# Patient Record
Sex: Male | Born: 1950
Health system: Southern US, Community
[De-identification: ages and names within clinical notes are randomized; demographics above are authoritative.]

## PROBLEM LIST (undated history)

## (undated) DIAGNOSIS — N200 Calculus of kidney: Secondary | ICD-10-CM

## (undated) DIAGNOSIS — M1990 Unspecified osteoarthritis, unspecified site: Secondary | ICD-10-CM

## (undated) DIAGNOSIS — Z87442 Personal history of urinary calculi: Secondary | ICD-10-CM

## (undated) DIAGNOSIS — D696 Thrombocytopenia, unspecified: Secondary | ICD-10-CM

## (undated) DIAGNOSIS — R011 Cardiac murmur, unspecified: Secondary | ICD-10-CM

## (undated) DIAGNOSIS — B1920 Unspecified viral hepatitis C without hepatic coma: Secondary | ICD-10-CM

## (undated) DIAGNOSIS — A630 Anogenital (venereal) warts: Secondary | ICD-10-CM

## (undated) DIAGNOSIS — K5790 Diverticulosis of intestine, part unspecified, without perforation or abscess without bleeding: Secondary | ICD-10-CM

## (undated) HISTORY — DX: Anogenital (venereal) warts: A63.0

## (undated) HISTORY — DX: Calculus of kidney: N20.0

## (undated) HISTORY — DX: Diverticulosis of intestine, part unspecified, without perforation or abscess without bleeding: K57.90

## (undated) HISTORY — PX: JOINT REPLACEMENT: SHX530

## (undated) HISTORY — DX: Thrombocytopenia, unspecified: D69.6

## (undated) HISTORY — DX: Unspecified viral hepatitis C without hepatic coma: B19.20

## (undated) HISTORY — DX: Unspecified osteoarthritis, unspecified site: M19.90

## (undated) HISTORY — PX: WISDOM TOOTH EXTRACTION: SHX21

---

## 2000-08-09 ENCOUNTER — Encounter: Payer: Self-pay | Admitting: Emergency Medicine

## 2000-08-09 ENCOUNTER — Emergency Department (HOSPITAL_COMMUNITY): Admission: EM | Admit: 2000-08-09 | Discharge: 2000-08-09 | Payer: Self-pay | Admitting: Emergency Medicine

## 2000-10-06 DIAGNOSIS — N200 Calculus of kidney: Secondary | ICD-10-CM

## 2000-10-06 HISTORY — DX: Calculus of kidney: N20.0

## 2002-10-06 HISTORY — PX: COLONOSCOPY: SHX174

## 2004-10-13 ENCOUNTER — Emergency Department (HOSPITAL_COMMUNITY): Admission: EM | Admit: 2004-10-13 | Discharge: 2004-10-13 | Payer: Self-pay | Admitting: Family Medicine

## 2004-11-06 ENCOUNTER — Ambulatory Visit: Payer: Self-pay | Admitting: Internal Medicine

## 2004-11-27 ENCOUNTER — Emergency Department (HOSPITAL_COMMUNITY): Admission: EM | Admit: 2004-11-27 | Discharge: 2004-11-27 | Payer: Self-pay | Admitting: Family Medicine

## 2004-11-28 ENCOUNTER — Ambulatory Visit (HOSPITAL_COMMUNITY): Admission: RE | Admit: 2004-11-28 | Discharge: 2004-11-28 | Payer: Self-pay | Admitting: Family Medicine

## 2004-12-04 ENCOUNTER — Emergency Department (HOSPITAL_COMMUNITY): Admission: EM | Admit: 2004-12-04 | Discharge: 2004-12-04 | Payer: Self-pay | Admitting: Family Medicine

## 2004-12-17 ENCOUNTER — Ambulatory Visit: Payer: Self-pay | Admitting: Internal Medicine

## 2005-01-03 ENCOUNTER — Ambulatory Visit: Payer: Self-pay | Admitting: Gastroenterology

## 2005-01-15 ENCOUNTER — Ambulatory Visit: Payer: Self-pay | Admitting: Gastroenterology

## 2005-12-22 ENCOUNTER — Ambulatory Visit: Payer: Self-pay | Admitting: Internal Medicine

## 2005-12-24 ENCOUNTER — Ambulatory Visit: Payer: Self-pay | Admitting: Internal Medicine

## 2007-06-16 ENCOUNTER — Ambulatory Visit: Payer: Self-pay | Admitting: Internal Medicine

## 2007-06-16 DIAGNOSIS — B182 Chronic viral hepatitis C: Secondary | ICD-10-CM | POA: Insufficient documentation

## 2007-06-16 DIAGNOSIS — Z8619 Personal history of other infectious and parasitic diseases: Secondary | ICD-10-CM | POA: Insufficient documentation

## 2007-08-27 ENCOUNTER — Telehealth: Payer: Self-pay | Admitting: Internal Medicine

## 2007-09-14 ENCOUNTER — Telehealth (INDEPENDENT_AMBULATORY_CARE_PROVIDER_SITE_OTHER): Payer: Self-pay | Admitting: *Deleted

## 2007-09-17 ENCOUNTER — Ambulatory Visit: Payer: Self-pay | Admitting: Internal Medicine

## 2007-09-18 ENCOUNTER — Encounter (INDEPENDENT_AMBULATORY_CARE_PROVIDER_SITE_OTHER): Payer: Self-pay | Admitting: *Deleted

## 2007-09-18 LAB — CONVERTED CEMR LAB
INR: 1.1 (ref 0.0–1.5)
Prothrombin Time: 14.2 s (ref 11.6–15.2)

## 2007-11-18 ENCOUNTER — Ambulatory Visit: Payer: Self-pay | Admitting: Gastroenterology

## 2007-12-24 ENCOUNTER — Telehealth (INDEPENDENT_AMBULATORY_CARE_PROVIDER_SITE_OTHER): Payer: Self-pay | Admitting: *Deleted

## 2008-01-07 ENCOUNTER — Telehealth (INDEPENDENT_AMBULATORY_CARE_PROVIDER_SITE_OTHER): Payer: Self-pay | Admitting: *Deleted

## 2008-01-13 ENCOUNTER — Ambulatory Visit: Payer: Self-pay | Admitting: Internal Medicine

## 2008-02-27 ENCOUNTER — Encounter: Payer: Self-pay | Admitting: Internal Medicine

## 2008-04-27 ENCOUNTER — Ambulatory Visit: Payer: Self-pay | Admitting: Gastroenterology

## 2008-10-06 DIAGNOSIS — D696 Thrombocytopenia, unspecified: Secondary | ICD-10-CM

## 2008-10-06 HISTORY — DX: Thrombocytopenia, unspecified: D69.6

## 2008-11-08 ENCOUNTER — Ambulatory Visit: Payer: Self-pay | Admitting: Internal Medicine

## 2008-12-11 ENCOUNTER — Telehealth (INDEPENDENT_AMBULATORY_CARE_PROVIDER_SITE_OTHER): Payer: Self-pay | Admitting: *Deleted

## 2009-03-07 ENCOUNTER — Ambulatory Visit: Payer: Self-pay | Admitting: Internal Medicine

## 2009-03-07 DIAGNOSIS — Z87442 Personal history of urinary calculi: Secondary | ICD-10-CM | POA: Insufficient documentation

## 2009-03-07 DIAGNOSIS — K5730 Diverticulosis of large intestine without perforation or abscess without bleeding: Secondary | ICD-10-CM | POA: Insufficient documentation

## 2009-03-14 ENCOUNTER — Encounter (INDEPENDENT_AMBULATORY_CARE_PROVIDER_SITE_OTHER): Payer: Self-pay | Admitting: *Deleted

## 2010-04-10 ENCOUNTER — Ambulatory Visit: Payer: Self-pay | Admitting: Internal Medicine

## 2010-04-10 DIAGNOSIS — IMO0002 Reserved for concepts with insufficient information to code with codable children: Secondary | ICD-10-CM | POA: Insufficient documentation

## 2010-04-12 ENCOUNTER — Ambulatory Visit: Payer: Self-pay | Admitting: Internal Medicine

## 2010-04-22 LAB — CONVERTED CEMR LAB
ALT: 58 units/L — ABNORMAL HIGH (ref 0–53)
AST: 43 units/L — ABNORMAL HIGH (ref 0–37)
Albumin: 3.9 g/dL (ref 3.5–5.2)
Alkaline Phosphatase: 64 units/L (ref 39–117)
BUN: 20 mg/dL (ref 6–23)
Basophils Absolute: 0 10*3/uL (ref 0.0–0.1)
Basophils Relative: 0.6 % (ref 0.0–3.0)
Bilirubin Urine: NEGATIVE
Bilirubin, Direct: 0.1 mg/dL (ref 0.0–0.3)
CO2: 30 meq/L (ref 19–32)
Calcium: 8.9 mg/dL (ref 8.4–10.5)
Chloride: 109 meq/L (ref 96–112)
Cholesterol: 129 mg/dL (ref 0–200)
Creatinine, Ser: 0.7 mg/dL (ref 0.4–1.5)
Eosinophils Absolute: 0.2 10*3/uL (ref 0.0–0.7)
Eosinophils Relative: 5 % (ref 0.0–5.0)
GFR calc non Af Amer: 115.9 mL/min (ref >60)
Glucose, Bld: 99 mg/dL (ref 70–99)
HCT: 43.7 % (ref 39.0–52.0)
HDL: 35 mg/dL — ABNORMAL LOW (ref >39.00)
Hemoglobin, Urine: NEGATIVE
Hemoglobin: 15.3 g/dL (ref 13.0–17.0)
Ketones, ur: NEGATIVE mg/dL
LDL Cholesterol: 85 mg/dL (ref 0–99)
Leukocytes, UA: NEGATIVE
Lymphocytes Relative: 24.8 % (ref 12.0–46.0)
Lymphs Abs: 0.9 10*3/uL (ref 0.7–4.0)
MCHC: 35.1 g/dL (ref 30.0–36.0)
MCV: 92.1 fL (ref 78.0–100.0)
Monocytes Absolute: 0.4 10*3/uL (ref 0.1–1.0)
Monocytes Relative: 9.3 % (ref 3.0–12.0)
Neutro Abs: 2.3 10*3/uL (ref 1.4–7.7)
Neutrophils Relative %: 60.3 % (ref 43.0–77.0)
Nitrite: NEGATIVE
PSA: 0.34 ng/mL (ref 0.10–4.00)
Platelets: 114 10*3/uL — ABNORMAL LOW (ref 150.0–400.0)
Potassium: 4.4 meq/L (ref 3.5–5.1)
RBC: 4.75 M/uL (ref 4.22–5.81)
RDW: 13.3 % (ref 11.5–14.6)
Sodium: 144 meq/L (ref 135–145)
Specific Gravity, Urine: 1.02 (ref 1.000–1.030)
TSH: 1.73 microintl units/mL (ref 0.35–5.50)
Total Bilirubin: 0.5 mg/dL (ref 0.3–1.2)
Total CHOL/HDL Ratio: 4
Total Protein, Urine: NEGATIVE mg/dL
Total Protein: 6.6 g/dL (ref 6.0–8.3)
Triglycerides: 46 mg/dL (ref 0.0–149.0)
Urine Glucose: NEGATIVE mg/dL
Urobilinogen, UA: 0.2 (ref 0.0–1.0)
VLDL: 9.2 mg/dL (ref 0.0–40.0)
WBC: 3.8 10*3/uL — ABNORMAL LOW (ref 4.5–10.5)
pH: 7.5 (ref 5.0–8.0)

## 2010-10-06 HISTORY — PX: OTHER SURGICAL HISTORY: SHX169

## 2010-10-06 HISTORY — PX: FLEXIBLE SIGMOIDOSCOPY: SHX1649

## 2010-11-03 LAB — CONVERTED CEMR LAB
ALT: 73 units/L — ABNORMAL HIGH (ref 0–53)
ALT: 93 units/L — ABNORMAL HIGH (ref 0–53)
AST: 51 units/L — ABNORMAL HIGH (ref 0–37)
AST: 63 units/L — ABNORMAL HIGH (ref 0–37)
Albumin: 3.9 g/dL (ref 3.5–5.2)
Albumin: 4 g/dL (ref 3.5–5.2)
Alkaline Phosphatase: 56 units/L (ref 39–117)
Alkaline Phosphatase: 57 units/L (ref 39–117)
BUN: 21 mg/dL (ref 6–23)
BUN: 22 mg/dL (ref 6–23)
Basophils Absolute: 0 10*3/uL (ref 0.0–0.1)
Basophils Absolute: 0 10*3/uL (ref 0.0–0.1)
Basophils Relative: 0.2 % (ref 0.0–1.0)
Basophils Relative: 0.3 % (ref 0.0–3.0)
Bilirubin, Direct: 0.2 mg/dL (ref 0.0–0.3)
Bilirubin, Direct: 0.2 mg/dL (ref 0.0–0.3)
CO2: 32 meq/L (ref 19–32)
CO2: 32 meq/L (ref 19–32)
Calcium: 9 mg/dL (ref 8.4–10.5)
Calcium: 9.3 mg/dL (ref 8.4–10.5)
Chloride: 105 meq/L (ref 96–112)
Chloride: 111 meq/L (ref 96–112)
Cholesterol: 131 mg/dL (ref 0–200)
Cholesterol: 143 mg/dL (ref 0–200)
Creatinine, Ser: 0.8 mg/dL (ref 0.4–1.5)
Creatinine, Ser: 0.9 mg/dL (ref 0.4–1.5)
Eosinophils Absolute: 0.2 10*3/uL (ref 0.0–0.6)
Eosinophils Absolute: 0.2 10*3/uL (ref 0.0–0.7)
Eosinophils Relative: 5.3 % — ABNORMAL HIGH (ref 0.0–5.0)
Eosinophils Relative: 5.6 % — ABNORMAL HIGH (ref 0.0–5.0)
GFR calc Af Amer: 113 mL/min
GFR calc non Af Amer: 106.34 mL/min (ref >60)
GFR calc non Af Amer: 93 mL/min
Glucose, Bld: 102 mg/dL — ABNORMAL HIGH (ref 70–99)
Glucose, Bld: 87 mg/dL (ref 70–99)
HCT: 44.4 % (ref 39.0–52.0)
HCT: 47.1 % (ref 39.0–52.0)
HDL: 35.7 mg/dL — ABNORMAL LOW (ref >39.0)
HDL: 43.2 mg/dL (ref >39.00)
Hemoglobin: 15.7 g/dL (ref 13.0–17.0)
Hemoglobin: 16.5 g/dL (ref 13.0–17.0)
LDL Cholesterol: 88 mg/dL (ref 0–99)
LDL Cholesterol: 90 mg/dL (ref 0–99)
Lymphocytes Relative: 19.7 % (ref 12.0–46.0)
Lymphocytes Relative: 23.7 % (ref 12.0–46.0)
Lymphs Abs: 0.7 10*3/uL (ref 0.7–4.0)
MCHC: 34.9 g/dL (ref 30.0–36.0)
MCHC: 35.3 g/dL (ref 30.0–36.0)
MCV: 88.7 fL (ref 78.0–100.0)
MCV: 91.8 fL (ref 78.0–100.0)
Monocytes Absolute: 0.3 10*3/uL (ref 0.2–0.7)
Monocytes Absolute: 0.4 10*3/uL (ref 0.1–1.0)
Monocytes Relative: 10.5 % (ref 3.0–12.0)
Monocytes Relative: 7.7 % (ref 3.0–11.0)
Neutro Abs: 2.3 10*3/uL (ref 1.4–7.7)
Neutro Abs: 2.5 10*3/uL (ref 1.4–7.7)
Neutrophils Relative %: 63.1 % (ref 43.0–77.0)
Neutrophils Relative %: 63.9 % (ref 43.0–77.0)
PSA: 0.48 ng/mL (ref 0.10–4.00)
PSA: 0.56 ng/mL (ref 0.10–4.00)
Platelets: 144 10*3/uL — ABNORMAL LOW (ref 150–400)
Platelets: 99 10*3/uL — ABNORMAL LOW (ref 150.0–400.0)
Potassium: 4 meq/L (ref 3.5–5.1)
Potassium: 4.2 meq/L (ref 3.5–5.1)
RBC: 5.01 M/uL (ref 4.22–5.81)
RBC: 5.13 M/uL (ref 4.22–5.81)
RDW: 12.3 % (ref 11.5–14.6)
RDW: 12.3 % (ref 11.5–14.6)
Sodium: 141 meq/L (ref 135–145)
Sodium: 145 meq/L (ref 135–145)
TSH: 0.68 microintl units/mL (ref 0.35–5.50)
TSH: 1.21 microintl units/mL (ref 0.35–5.50)
Total Bilirubin: 1.3 mg/dL — ABNORMAL HIGH (ref 0.3–1.2)
Total Bilirubin: 1.3 mg/dL — ABNORMAL HIGH (ref 0.3–1.2)
Total CHOL/HDL Ratio: 3
Total CHOL/HDL Ratio: 3.7
Total Protein: 6.8 g/dL (ref 6.0–8.3)
Total Protein: 6.9 g/dL (ref 6.0–8.3)
Triglycerides: 37 mg/dL (ref 0–149)
Triglycerides: 49 mg/dL (ref 0.0–149.0)
VLDL: 7 mg/dL (ref 0–40)
VLDL: 9.8 mg/dL (ref 0.0–40.0)
WBC: 3.6 10*3/uL — ABNORMAL LOW (ref 4.5–10.5)
WBC: 3.9 10*3/uL — ABNORMAL LOW (ref 4.5–10.5)

## 2010-11-05 NOTE — Assessment & Plan Note (Signed)
Summary: cpx//pt will be fasting//lch   Vital Signs:  Patient profile:   60 year old male Height:      69.5 inches Weight:      169.8 pounds BMI:     24.80 Temp:     98.3 degrees F oral Resp:     14 per minute BP sitting:   118 / 70  (left arm) Cuff size:   large  Vitals Entered By: Shonna Chock (April 10, 2010 8:26 AM)  CC: CPX (not fasting today), General Medical Evaluation, Lower Extremity Joint pain Comments REVIEWED MED LIST, PATIENT AGREED DOSE AND INSTRUCTION CORRECT    CC:  CPX (not fasting today), General Medical Evaluation, and Lower Extremity Joint pain.  History of Present Illness: Timothy Bentley is here for a physical ; he has occasional RLE pain from hip to ankle.   The patient reports some  associated  decreased ROM, but denies swelling, redness, giving away, locking, popping, stiffness for >1 hr, or weakness.  The pain is located in the right hip andradiates to the  right ankle.  The pain began gradually several weeks ago and with no injury.  The pain is described as aching and intermittent. To date  ther has been  no evaluation.  The patient denies the following symptoms: fever, rash, photosensitivity, eye symptoms, diarrhea, and dysuria. Rx: none, but it has been better this week.  Allergies (verified): No Known Drug Allergies  Past History:  Past Medical History: Hepatitis C, Genotype 23 in  2000;[facility]  , treatment declined Early Repolarization ST-T EKG changes Nephrolithiasis, hx of 2002 ,X1 Diverticulosis, colon  Past Surgical History: Denies surgical history except Wisdom Teeth extraction Colonoscopy  2007: Diverticulosis   Family History: Father: Meniere's Syndrome, alcoholism,CAD ,S/P CABG Mother: negative Siblings: negative; M uncle:PMH of  Hepatitis ?A  Social History: Heart Healthy Diet Single Never Smoked Alcohol use-no Regular exercise-yes: tennis, squash, yoga, running ,swimming, weights Occupation: [employer]   Review of  Systems General:  Denies chills, fatigue, fever, loss of appetite, sweats, and weight loss. Eyes:  Denies discharge, eye pain, red eye, and vision loss-both eyes. ENT:  Denies difficulty swallowing, hoarseness, nasal congestion, and sinus pressure. CV:  Denies chest pain or discomfort, leg cramps with exertion, palpitations, and shortness of breath with exertion. Resp:  Denies cough, shortness of breath, sputum productive, and wheezing. GI:  Complains of indigestion; denies abdominal pain, bloody stools, constipation, dark tarry stools, diarrhea, and yellowish skin color; Diet related dyspepsia; no Rx. No clay colored stool. GU:  Denies discharge, hematuria, and incontinence; No dark urine. MS:  Denies low back pain, mid back pain, and thoracic pain. Derm:  Denies changes in nail beds, dryness, hair loss, and lesion(s); Groin " prickly heat". Neuro:  Denies disturbances in coordination, numbness, poor balance, and tingling. Psych:  Denies anxiety and depression. Endo:  Denies cold intolerance, excessive hunger, excessive thirst, excessive urination, and heat intolerance. Heme:  Denies abnormal bruising and bleeding. Allergy:  Denies itching eyes and sneezing.  Physical Exam  General:  Thin but well-nourished; alert,appropriate and cooperative throughout examination Head:  Normocephalic and atraumatic without obvious abnormalities.  Eyes:  No corneal or conjunctival inflammation noted. Perrla. Funduscopic exam benign, without hemorrhages, exudates or papilledema.  Ears:  External ear exam shows no significant lesions or deformities.  Otoscopic examination reveals clear canals, tympanic membranes are intact bilaterally without bulging, retraction, inflammation or discharge. Hearing is grossly normal bilaterally. Nose:  External nasal examination shows  no deformity or inflammation. Nasal mucosa are pink and moist without lesions or exudates. Mouth:  Oral mucosa and oropharynx without lesions or  exudates.  Teeth in good repair. Neck:  No deformities, masses, or tenderness noted. Lungs:  Normal respiratory effort, chest expands symmetrically. Lungs are clear to auscultation, no crackles or wheezes. Heart:  regular rhythm, no murmur, no gallop, no rub, no JVD, no HJR, and bradycardia.   Abdomen:  Bowel sounds positive,abdomen soft and non-tender without masses, organomegaly or hernias noted. Rectal:  No external abnormalities noted. Normal sphincter tone. No rectal masses or tenderness. Genitalia:  Testes bilaterally descended without nodularity, tenderness or masses. No scrotal masses or lesions. No penis lesions or urethral discharge. Small L epididymal granuloma  Prostate:  Prostate gland firm and smooth, no enlargement, nodularity, tenderness, mass, asymmetry or induration. Msk:  No deformity or scoliosis noted of thoracic or lumbar spine.   Pulses:  R and L carotid,radial,dorsalis pedis and posterior tibial pulses are full and equal bilaterally Extremities:  No clubbing, cyanosis, edema, or deformity noted with normal full range of motion of all joints.Negative SLR   Neurologic:  alert & oriented X3, strength normal in all extremities, gait(heel & toe walking) normal, and DTRs symmetrical and normal.   Skin:  Intact without suspicious lesions or rashes. No jaundice Cervical Nodes:  No lymphadenopathy noted Axillary Nodes:  No palpable lymphadenopathy Psych:  memory intact for recent and remote, normally interactive, and good eye contact.     Impression & Recommendations:  Problem # 1:  ROUTINE GENERAL MEDICAL EXAM@[email or web address]  CARE FACL (ICD-V70.0)  Orders: EKG w/ Interpretation (93000)  Problem # 2:  LUMBAR RADICULOPATHY, RIGHT (ICD-724.4)  Problem # 3:  HEPATITIS C (ICD-070.51) PMH of ; clinically stable  Problem # 4:  NEPHROLITHIASIS, HX OF (ICD-V13.01) PMH of X 1  Patient Instructions: 1)  Please schedule fasting labs: 2)  BMP ; 3)  Hepatic Panel; 4)  Lipid Panel ; 5)   TSH; 6)  CBC w/ Diff ; 7)  Urine-dip; 8)  PSA. Codes: V70.0,070.51,724.4.

## 2010-11-07 ENCOUNTER — Telehealth (INDEPENDENT_AMBULATORY_CARE_PROVIDER_SITE_OTHER): Payer: Self-pay | Admitting: *Deleted

## 2010-11-13 NOTE — Progress Notes (Signed)
Summary: Lab Request  Phone Note Call from Patient   Caller: [facility] Summary of Call: Arline Asp from Dr. Trilby Drummer office called requesting last labs be faxed to her. Labs printed and faxed Initial call taken by: Lavell Islam,  November 07, 2010 9:01 AM

## 2010-12-04 ENCOUNTER — Encounter (HOSPITAL_BASED_OUTPATIENT_CLINIC_OR_DEPARTMENT_OTHER)
Admission: RE | Admit: 2010-12-04 | Discharge: 2010-12-04 | Disposition: A | Discharge status: Home / Self Care | Payer: BC Managed Care – PPO | Source: Ambulatory Visit | Attending: General Surgery | Admitting: General Surgery

## 2010-12-04 DIAGNOSIS — Z01812 Encounter for preprocedural laboratory examination: Secondary | ICD-10-CM | POA: Insufficient documentation

## 2010-12-04 LAB — DIFFERENTIAL
Basophils Relative: 0 % (ref 0–1)
Eosinophils Absolute: 0.2 10*3/uL (ref 0.0–0.7)
Monocytes Absolute: 0.4 10*3/uL (ref 0.1–1.0)
Monocytes Relative: 8 % (ref 3–12)
Neutrophils Relative %: 66 % (ref 43–77)

## 2010-12-04 LAB — COMPREHENSIVE METABOLIC PANEL
ALT: 66 U/L — ABNORMAL HIGH (ref 0–53)
AST: 50 U/L — ABNORMAL HIGH (ref 0–37)
Albumin: 4.1 g/dL (ref 3.5–5.2)
Alkaline Phosphatase: 60 U/L (ref 39–117)
BUN: 18 mg/dL (ref 6–23)
CO2: 30 mEq/L (ref 19–32)
Calcium: 9.2 mg/dL (ref 8.4–10.5)
Chloride: 104 mEq/L (ref 96–112)
Creatinine, Ser: 0.82 mg/dL (ref 0.4–1.5)
GFR calc Af Amer: >60 mL/min (ref >60)
GFR calc non Af Amer: >60 mL/min (ref >60)
Glucose, Bld: 101 mg/dL — ABNORMAL HIGH (ref 70–99)
Potassium: 4.4 mEq/L (ref 3.5–5.1)
Sodium: 139 mEq/L (ref 135–145)
Total Bilirubin: 0.9 mg/dL (ref 0.3–1.2)
Total Protein: 6.8 g/dL (ref 6.0–8.3)

## 2010-12-04 LAB — CBC
MCH: 31.5 pg (ref 26.0–34.0)
MCHC: 36.1 g/dL — ABNORMAL HIGH (ref 30.0–36.0)
Platelets: 122 10*3/uL — ABNORMAL LOW (ref 150–400)
RDW: 12.6 % (ref 11.5–15.5)

## 2010-12-06 ENCOUNTER — Other Ambulatory Visit: Payer: Self-pay | Admitting: General Surgery

## 2010-12-06 ENCOUNTER — Ambulatory Visit (HOSPITAL_BASED_OUTPATIENT_CLINIC_OR_DEPARTMENT_OTHER)
Admission: RE | Admit: 2010-12-06 | Discharge: 2010-12-06 | Disposition: A | Discharge status: Home / Self Care | Payer: BC Managed Care – PPO | Source: Ambulatory Visit | Attending: General Surgery | Admitting: General Surgery

## 2010-12-06 DIAGNOSIS — A630 Anogenital (venereal) warts: Secondary | ICD-10-CM | POA: Insufficient documentation

## 2010-12-06 DIAGNOSIS — Z01818 Encounter for other preprocedural examination: Secondary | ICD-10-CM | POA: Insufficient documentation

## 2010-12-06 DIAGNOSIS — Z01812 Encounter for preprocedural laboratory examination: Secondary | ICD-10-CM | POA: Insufficient documentation

## 2010-12-06 DIAGNOSIS — B1920 Unspecified viral hepatitis C without hepatic coma: Secondary | ICD-10-CM | POA: Insufficient documentation

## 2010-12-14 NOTE — Op Note (Signed)
  NAMEASHAAD, GAERTNER                 ACCOUNT NO.:  [id number]  MEDICAL RECORD NO.:  [id number]          PATIENT TYPE:  LOCATION:                                 FACILITY:  PHYSICIAN:  Adolph Pollack, M.D.    DATE OF BIRTH:  DATE OF PROCEDURE:  12/06/2010 DATE OF DISCHARGE:                              OPERATIVE REPORT   PREOPERATIVE DIAGNOSIS:  Anal condyloma.  POSTOPERATIVE DIAGNOSIS:  Anal condyloma.  PROCEDURE:  Excision and fulguration of anal condyloma.  SURGEON:  Adolph Pollack, MD  ANESTHESIA:  General plus Marcaine for perianal block.  INDICATIONS:  A 60 year old male who has had problems with genital warts in the past and has been treated by his dermatologist.  He is now noted to have multiple perianal warts including some in the anal canal, presents for the above procedure.  We went over the procedure and risks preoperatively.  TECHNIQUE:  He was brought to the operating room, placed supine on theoperating table and given a general anesthetic by way of LMA.  He was then placed in the lithotomy position and the perianal area was sterilely prepped and draped.  I sharply biopsied three areas of condyloma at the 3, 9, and 12 o'clock positions and sent these to pathology.  Subsequently using electrocautery, I fulgurated the remaining condyloma in the perianal area, some on the perianal skin, some at the anal verge side.  There were two superior and lateral, both on right and left, perianal area which were fulgurated as well.  I then wiped away the Bovie residual and continued to fulgurate more of the smaller condyloma present.  Once I felt I had adequate perianal fulguration, I then used an anoscope and inspected the anal canal.  In the posterior right lateral positions, anal condyloma were noted and these were fulgurated as well.  The rest of the anal canal was clear without evidence of mass or condyloma.  Following this, I performed a perianal block with  0.5% Marcaine with epinephrine.  I then applied topical gel to the perianal area followed by bulky dressing.  He tolerated the procedure well without any apparent complications and was taken to recovery room in satisfactory condition.     Adolph Pollack, M.D.     Kari Baars  D:  12/06/2010  T:  12/07/2010  Job:  [id number]  cc:   Anselmo Rod, MD, [facility]  Electronically Signed by Avel Peace M.D. on 12/14/2010 09:32:25 PM

## 2011-04-15 ENCOUNTER — Encounter: Payer: Self-pay | Admitting: Internal Medicine

## 2011-05-05 ENCOUNTER — Encounter: Payer: Self-pay | Admitting: Internal Medicine

## 2011-05-05 ENCOUNTER — Encounter: Payer: BC Managed Care – PPO | Admitting: Internal Medicine

## 2011-05-05 ENCOUNTER — Ambulatory Visit (INDEPENDENT_AMBULATORY_CARE_PROVIDER_SITE_OTHER): Payer: BC Managed Care – PPO | Admitting: Internal Medicine

## 2011-05-05 VITALS — BP 136/90 | HR 62 | Temp 98.5°F | Resp 12 | Ht 69.5 in | Wt 167.4 lb

## 2011-05-05 DIAGNOSIS — Z87442 Personal history of urinary calculi: Secondary | ICD-10-CM

## 2011-05-05 DIAGNOSIS — IMO0002 Reserved for concepts with insufficient information to code with codable children: Secondary | ICD-10-CM

## 2011-05-05 DIAGNOSIS — B1710 Acute hepatitis C without hepatic coma: Secondary | ICD-10-CM

## 2011-05-05 DIAGNOSIS — Z0000 Encounter for general adult medical examination without abnormal findings: Secondary | ICD-10-CM

## 2011-05-05 LAB — HEPATIC FUNCTION PANEL
ALT: 56 U/L — ABNORMAL HIGH (ref 0–53)
AST: 42 U/L — ABNORMAL HIGH (ref 0–37)
Bilirubin, Direct: 0.2 mg/dL (ref 0.0–0.3)
Total Protein: 7.2 g/dL (ref 6.0–8.3)

## 2011-05-05 LAB — CBC WITH DIFFERENTIAL/PLATELET
Basophils Absolute: 0 10*3/uL (ref 0.0–0.1)
Eosinophils Relative: 3.5 % (ref 0.0–5.0)
Monocytes Relative: 7.6 % (ref 3.0–12.0)
Neutrophils Relative %: 64.8 % (ref 43.0–77.0)
Platelets: 127 10*3/uL — ABNORMAL LOW (ref 150.0–400.0)
WBC: 4.9 10*3/uL (ref 4.5–10.5)

## 2011-05-05 LAB — LIPID PANEL
Cholesterol: 133 mg/dL (ref 0–200)
LDL Cholesterol: 80 mg/dL (ref 0–99)
Triglycerides: 59 mg/dL (ref 0.0–149.0)
VLDL: 11.8 mg/dL (ref 0.0–40.0)

## 2011-05-05 LAB — BASIC METABOLIC PANEL
BUN: 22 mg/dL (ref 6–23)
Calcium: 9.3 mg/dL (ref 8.4–10.5)
Creatinine, Ser: 0.9 mg/dL (ref 0.4–1.5)
GFR: 94.54 mL/min (ref >60.00)

## 2011-05-05 NOTE — Progress Notes (Signed)
Subjective:    Patient ID: Timothy Bentley, male    DOB: 06/10/1951, 60 y.o.   MRN: [id number]  HPI  Wilson  is here for a physical; he has no acute issues. He been treated by Dr Emily Filbert , Dr Loreta Ave & Dr Abbey Chatters for venereal warts.       Review of Systems Patient reports no  vision/ hearing changes,anorexia, weight change, fever ,adenopathy, persistant / recurrent hoarseness, swallowing issues, chest pain,palpitations, edema,persistant / recurrent cough, hemoptysis, dyspnea(rest, exertional, paroxysmal nocturnal), gastrointestinal  bleeding (melena, rectal bleeding), abdominal pain, excessive heart burn, GU symptoms( dysuria, hematuria, pyuria, voiding/incontinence  issues) syncope, focal weakness, memory loss,numbness & tingling, depression, anxiety, abnormal bruising/bleeding, or  musculoskeletal symptoms/signs.      Objective:   Physical Exam Gen.: Thin but healthy and well-nourished in appearance. Alert, appropriate and cooperative throughout exam. Head: Normocephalic without obvious abnormalities;  pattern alopecia  Eyes: No corneal or conjunctival inflammation noted. Pupils equal round reactive to light and accommodation. Fundal exam is benign without hemorrhages, exudate, papilledema. Extraocular motion intact. Vision grossly normal. Ears: External  ear exam reveals no significant lesions or deformities. Canals clear .TMs normal. Hearing is grossly normal bilaterally. Nose: External nasal exam reveals no deformity or inflammation. Nasal mucosa are pink and moist. No lesions or exudates noted. Septum  normal  Mouth: Oral mucosa and oropharynx reveal no lesions or exudates. Teeth in good repair. Neck: No deformities, masses, or tenderness noted. Range of motion &. Thyroid normal. Lungs: Normal respiratory effort; chest expands symmetrically. Lungs are clear to auscultation without rales, wheezes, or increased work of breathing. Heart: Slow  rate and regular  rhythm. Normal S1 and S2. No gallop,  click, or rub. No murmur. Abdomen: Bowel sounds normal; abdomen soft and nontender. No masses, organomegaly or hernias noted. Genitalia negative.: Rectal deferred ( S/P Flex Sigmoid). Note: PSA always < 1.00, 0.49 in 2004)                                                                      Musculoskeletal/extremities: No deformity or scoliosis noted of  the thoracic or lumbar spine. No clubbing, cyanosis, edema, or deformity noted. Range of motion  normal .Tone & strength  Normal.Joints:MINIMAL DIP DJD changes. Nail health  good. Vascular: Carotid, radial artery, dorsalis pedis and  posterior tibial pulses are full and equal. No bruits present. Neurologic: Alert and oriented x3. Deep tendon reflexes symmetrical and normal.          Skin: Intact without suspicious lesions or rashes. Lymph: No cervical, axillary, or inguinal lymphadenopathy present. Psych: Mood and affect are normal. Normally interactive                                                                                         Assessment & Plan:  #1 comprehensive physical exam; no acute findings #2 see Problem List with Assessments & Recommendations Plan: see  Orders  LUMBAR RADICULOPATHY, RIGHT inactive  NEPHROLITHIASIS, HX OF X1 in 2002

## 2011-05-05 NOTE — Patient Instructions (Signed)
Preventive Health Care: Eat a low-fat diet with lots of fruits and vegetables, up to 7-9 servings per day. Consume less than 40 grams of sugar per day from foods & drinks with High Fructose Corn Sugar as #1, 2,3 or # 4 on label. Health Care Power of Attorney & Living Will. Complete if not in place ; these place you in charge of your health care decisions.

## 2011-05-05 NOTE — Assessment & Plan Note (Signed)
inactive

## 2011-05-05 NOTE — Assessment & Plan Note (Signed)
X1 in 2002

## 2011-05-08 ENCOUNTER — Ambulatory Visit (INDEPENDENT_AMBULATORY_CARE_PROVIDER_SITE_OTHER): Payer: BC Managed Care – PPO | Admitting: General Surgery

## 2011-05-08 DIAGNOSIS — A630 Anogenital (venereal) warts: Secondary | ICD-10-CM

## 2011-05-08 NOTE — Patient Instructions (Addendum)
Remove bandage and wash area in 4 hours.

## 2011-05-08 NOTE — Progress Notes (Signed)
He is here for followup of his perianal condyloma. He felt one irregular area on the left side. Dermatologist put him on some topical 5-FU. He has some itching.  Physical exam:  Anorectal-left buttock region small condyloma noted. Small anterior fissure noted it is nontender. On digital rectal exam no masses felt.  Anoscopy-no anal canal condyloma noted. Small internal hemorrhoid right anterior side.  Assessment: Perianal condyloma with one area of recurrence. Chemical ablation performed.  Plan: Return visit in 3 weeks.

## 2011-06-03 ENCOUNTER — Ambulatory Visit (INDEPENDENT_AMBULATORY_CARE_PROVIDER_SITE_OTHER): Payer: BC Managed Care – PPO | Admitting: General Surgery

## 2011-06-03 DIAGNOSIS — A630 Anogenital (venereal) warts: Secondary | ICD-10-CM

## 2011-06-03 NOTE — Patient Instructions (Signed)
Continue your self inspections.

## 2011-06-03 NOTE — Progress Notes (Signed)
Timothy Bentley is here for followup after chemical ablation of a anal condyloma. He states he feels the area is gone.  Exam: There are no perianal lesions present.  Assessment: Anal condyloma successfully treated with chemical ablation.  Plan: Continue self exams. Return visit p.r.n.

## 2011-07-31 ENCOUNTER — Ambulatory Visit (INDEPENDENT_AMBULATORY_CARE_PROVIDER_SITE_OTHER): Payer: BC Managed Care – PPO | Admitting: General Surgery

## 2011-07-31 ENCOUNTER — Encounter (INDEPENDENT_AMBULATORY_CARE_PROVIDER_SITE_OTHER): Payer: Self-pay

## 2011-07-31 ENCOUNTER — Encounter (INDEPENDENT_AMBULATORY_CARE_PROVIDER_SITE_OTHER): Payer: Self-pay | Admitting: General Surgery

## 2011-07-31 VITALS — BP 146/82 | HR 80 | Temp 96.8°F | Resp 14 | Ht 69.0 in | Wt 167.0 lb

## 2011-07-31 DIAGNOSIS — A630 Anogenital (venereal) warts: Secondary | ICD-10-CM

## 2011-07-31 NOTE — Patient Instructions (Signed)
Wash areas off in 4 hours.

## 2011-07-31 NOTE — Progress Notes (Signed)
Mr. Rayner returns to the office. He thinks he may have discovered 3 new condyloma. One in the right scrotum. The other 2 are in the perianal area. He does have some itching in these areas.  Exam: GU-small condylomatous lesion in the right scrotal area  Anorectal-2 small condylomas, one on the right and one on the left.  Chemical ablation of the condylomas was performed with Podophyllin and he tolerated this well.  Assessment:  Recurrent perianal condyloma and scrotal condyloma-treated with chemical ablation  Plan:  Wash areas in 4 hours.  Return visit in 4 wks.

## 2011-09-02 ENCOUNTER — Encounter (INDEPENDENT_AMBULATORY_CARE_PROVIDER_SITE_OTHER): Payer: Self-pay | Admitting: General Surgery

## 2011-09-02 ENCOUNTER — Ambulatory Visit (INDEPENDENT_AMBULATORY_CARE_PROVIDER_SITE_OTHER): Payer: BC Managed Care – PPO | Admitting: General Surgery

## 2011-09-02 VITALS — BP 126/84 | HR 72 | Temp 97.9°F | Resp 18 | Ht 69.0 in | Wt 165.0 lb

## 2011-09-02 DIAGNOSIS — A630 Anogenital (venereal) warts: Secondary | ICD-10-CM

## 2011-09-02 NOTE — Progress Notes (Signed)
She is here for followup of his anal condyloma status post chemical ablation treatment. He states he feels that one is still in the perianal area and he's not sure about the scrotal area.  Exam: GU-anal condyloma in the scrotal area is gone.  Anorectal-there is a persistent condyloma on the left side but the other 2 are no longer present.  Podophyllin was applied to the residual left sided condyloma.  Assess:  Anal condyloma and scrotal condyloma-most responded to chemically ablation; repeat chemical ablation was performed on the residual left-sided condyloma  Plan: Return visit 3 weeks.  If this condyloma persists then it may need to be removed surgically.

## 2011-09-02 NOTE — Patient Instructions (Signed)
Wash area in 4 hours. 

## 2011-09-24 ENCOUNTER — Encounter (INDEPENDENT_AMBULATORY_CARE_PROVIDER_SITE_OTHER): Payer: Self-pay | Admitting: General Surgery

## 2011-09-24 ENCOUNTER — Ambulatory Visit (INDEPENDENT_AMBULATORY_CARE_PROVIDER_SITE_OTHER): Payer: BC Managed Care – PPO | Admitting: General Surgery

## 2011-09-24 VITALS — BP 138/94 | HR 60 | Temp 97.0°F | Resp 18 | Ht 69.0 in | Wt 167.2 lb

## 2011-09-24 DIAGNOSIS — Z09 Encounter for follow-up examination after completed treatment for conditions other than malignant neoplasm: Secondary | ICD-10-CM

## 2011-09-24 NOTE — Progress Notes (Signed)
[name] he is here for followup of his chemical treatment of his left-sided condyloma. He thinks it is gone.  Exam: There are no perianal lesions present any longer  Assessment :  Perianal condyloma that have responded to chemical ablation treatment.  Plan: Return visit 3 months

## 2011-09-24 NOTE — Patient Instructions (Signed)
Call if you detect a recurrence before your next visit.

## 2011-12-30 ENCOUNTER — Encounter (INDEPENDENT_AMBULATORY_CARE_PROVIDER_SITE_OTHER): Payer: Self-pay | Admitting: General Surgery

## 2011-12-30 ENCOUNTER — Ambulatory Visit (INDEPENDENT_AMBULATORY_CARE_PROVIDER_SITE_OTHER): Payer: BC Managed Care – PPO | Admitting: General Surgery

## 2011-12-30 VITALS — BP 148/83 | HR 84 | Temp 98.6°F | Ht 69.0 in | Wt 170.6 lb

## 2011-12-30 DIAGNOSIS — A630 Anogenital (venereal) warts: Secondary | ICD-10-CM

## 2011-12-30 NOTE — Progress Notes (Signed)
Mr. Shallenberger returns for followup of his anal condyloma. He thinks he may have a new area on the left side. He also felt a small area on the right side.  Exam: Anal rectal-at the 1:00 position near the base of the scrotum as a new condylomatous type; there is a small skin tag 10:00 position. Podophyllin was applied to both these areas followed by a gauze dressing.  Assessment:  Anal condyloma-definite new area on the left side and questionable area on the right side, both of which have been treated.  Plan: Removed bandage and 4 hours and shower. Followup visit in 3 weeks. If the area does not resolve then we may need to take him to the operating room for biopsy and fulguration.

## 2011-12-30 NOTE — Patient Instructions (Signed)
Removed bandage in 4 hours and clean the area in the shower.

## 2012-01-21 ENCOUNTER — Encounter (INDEPENDENT_AMBULATORY_CARE_PROVIDER_SITE_OTHER): Payer: Self-pay | Admitting: General Surgery

## 2012-01-21 ENCOUNTER — Ambulatory Visit (INDEPENDENT_AMBULATORY_CARE_PROVIDER_SITE_OTHER): Payer: BC Managed Care – PPO | Admitting: General Surgery

## 2012-01-21 VITALS — BP 156/84 | HR 70 | Temp 97.6°F | Resp 18 | Ht 69.0 in | Wt 170.0 lb

## 2012-01-21 DIAGNOSIS — A630 Anogenital (venereal) warts: Secondary | ICD-10-CM

## 2012-01-21 NOTE — Patient Instructions (Signed)
We will schedule your operation today. 

## 2012-01-21 NOTE — Progress Notes (Signed)
Mr. Nunnery is here for follow up exam after his chemical ablation of his right and left-sided anal condyloma.  He says he still feels the areas.  Physical exam: Anal rectal-he still has a condyloma-type lesion on the left side and what appears to be a skin tag with possible early condyloma on the right side. Thus he did not respond to the chemical ablation.  Assessment: Persistent anal condyloma  Plan: Outpatient excision with local anesthesia and smoke evacuator. The procedure and risks were discussed with him. He understands and agrees with the plan.

## 2012-02-05 ENCOUNTER — Other Ambulatory Visit (INDEPENDENT_AMBULATORY_CARE_PROVIDER_SITE_OTHER): Payer: Self-pay | Admitting: General Surgery

## 2012-02-05 DIAGNOSIS — A630 Anogenital (venereal) warts: Secondary | ICD-10-CM

## 2012-02-05 HISTORY — PX: OTHER SURGICAL HISTORY: SHX169

## 2012-02-12 ENCOUNTER — Telehealth (INDEPENDENT_AMBULATORY_CARE_PROVIDER_SITE_OTHER): Payer: Self-pay | Admitting: General Surgery

## 2012-02-12 NOTE — Telephone Encounter (Signed)
Pt calling to report a very itchy rash on his buttocks.  He is S/P anal condyloma surgery.  Dr. Abbey Chatters paged for orders:  Benadryl and hydrocortisone cream topically. Pt updated with this information.

## 2012-02-24 ENCOUNTER — Encounter (INDEPENDENT_AMBULATORY_CARE_PROVIDER_SITE_OTHER): Payer: BC Managed Care – PPO | Admitting: General Surgery

## 2012-03-03 ENCOUNTER — Encounter (INDEPENDENT_AMBULATORY_CARE_PROVIDER_SITE_OTHER): Payer: Self-pay | Admitting: General Surgery

## 2012-03-03 ENCOUNTER — Ambulatory Visit (INDEPENDENT_AMBULATORY_CARE_PROVIDER_SITE_OTHER): Payer: BC Managed Care – PPO | Admitting: General Surgery

## 2012-03-03 VITALS — BP 140/86 | HR 70 | Temp 97.6°F | Resp 16 | Ht 69.0 in | Wt 169.1 lb

## 2012-03-03 DIAGNOSIS — Z9889 Other specified postprocedural states: Secondary | ICD-10-CM

## 2012-03-03 NOTE — Progress Notes (Signed)
Operation:  Excision and fulguration of anal condyloma  Date:  Feb 05, 2012  Pathology: Anal condyloma  HPI: He is here for his first postoperative visit. He has some itching after the surgery but not a lot of pain.   Physical Exam: Perianal area demonstrates 3 healing wounds 2 on the left side and one on the right side.  Assessment:  Wound is healing well after excision and fulguration of anal condyloma.  Plan:  Given his recurrence we'll see him back in one month for reexamination. He will close followup.

## 2012-03-03 NOTE — Patient Instructions (Signed)
Call if you find any new areas before your next appointment.

## 2012-03-17 ENCOUNTER — Other Ambulatory Visit: Payer: Self-pay | Admitting: Dermatology

## 2012-04-01 ENCOUNTER — Encounter (INDEPENDENT_AMBULATORY_CARE_PROVIDER_SITE_OTHER): Payer: BC Managed Care – PPO | Admitting: General Surgery

## 2012-05-05 ENCOUNTER — Encounter: Payer: BC Managed Care – PPO | Admitting: Internal Medicine

## 2012-05-06 ENCOUNTER — Encounter: Payer: Self-pay | Admitting: Internal Medicine

## 2012-05-06 ENCOUNTER — Ambulatory Visit (INDEPENDENT_AMBULATORY_CARE_PROVIDER_SITE_OTHER): Payer: BC Managed Care – PPO | Admitting: Internal Medicine

## 2012-05-06 VITALS — BP 136/80 | HR 70 | Temp 98.6°F | Resp 12 | Ht 69.03 in | Wt 167.0 lb

## 2012-05-06 DIAGNOSIS — Z0000 Encounter for general adult medical examination without abnormal findings: Secondary | ICD-10-CM

## 2012-05-06 LAB — CBC WITH DIFFERENTIAL/PLATELET
Basophils Absolute: 0 10*3/uL (ref 0.0–0.1)
Basophils Relative: 0.4 % (ref 0.0–3.0)
Eosinophils Absolute: 0.2 10*3/uL (ref 0.0–0.7)
Eosinophils Relative: 4 % (ref 0.0–5.0)
HCT: 47.8 % (ref 39.0–52.0)
Hemoglobin: 16.3 g/dL (ref 13.0–17.0)
Lymphocytes Relative: 22.4 % (ref 12.0–46.0)
Lymphs Abs: 1.1 10*3/uL (ref 0.7–4.0)
MCHC: 34.2 g/dL (ref 30.0–36.0)
MCV: 90.2 fl (ref 78.0–100.0)
Monocytes Absolute: 0.4 10*3/uL (ref 0.1–1.0)
Monocytes Relative: 8.7 % (ref 3.0–12.0)
Neutro Abs: 3.1 10*3/uL (ref 1.4–7.7)
Neutrophils Relative %: 64.5 % (ref 43.0–77.0)
Platelets: 132 10*3/uL — ABNORMAL LOW (ref 150.0–400.0)
RBC: 5.3 Mil/uL (ref 4.22–5.81)
RDW: 12.8 % (ref 11.5–14.6)
WBC: 4.7 10*3/uL (ref 4.5–10.5)

## 2012-05-06 LAB — TSH: TSH: 0.71 u[IU]/mL (ref 0.35–5.50)

## 2012-05-06 LAB — LIPID PANEL
HDL: 41.1 mg/dL (ref >39.00)
Triglycerides: 36 mg/dL (ref 0.0–149.0)

## 2012-05-06 LAB — HEPATIC FUNCTION PANEL
Albumin: 4.5 g/dL (ref 3.5–5.2)
Alkaline Phosphatase: 63 U/L (ref 39–117)
Total Protein: 7.2 g/dL (ref 6.0–8.3)

## 2012-05-06 LAB — BASIC METABOLIC PANEL WITH GFR
BUN: 27 mg/dL — ABNORMAL HIGH (ref 6–23)
CO2: 30 meq/L (ref 19–32)
Calcium: 9.4 mg/dL (ref 8.4–10.5)
Chloride: 104 meq/L (ref 96–112)
Creatinine, Ser: 0.8 mg/dL (ref 0.4–1.5)
GFR: 100.79 mL/min
Glucose, Bld: 82 mg/dL (ref 70–99)
Potassium: 4.2 meq/L (ref 3.5–5.1)
Sodium: 140 meq/L (ref 135–145)

## 2012-05-06 NOTE — Patient Instructions (Addendum)
Preventive Health Care: Eat a low-fat diet with lots of fruits and vegetables, up to 7-9 servings per day.  Consume less than 40 grams of sugar per day from foods & drinks with High Fructose Corn Sugar as # 1,2,3 or # 4 on label. Health Care Power of Attorney & Living Will. Complete if not in place ; these place you in charge of your health care decisions. Please try to go on My Chart within the next 24 hours to allow me to release the results directly to you.

## 2012-05-06 NOTE — Progress Notes (Signed)
  Subjective:    Patient ID: Timothy Bentley, male    DOB: November 06, 1950, 61 y.o.   MRN: [id number]  HPI  Timothy Bentley  is here for a physical; he has no acute issues.     Review of Systems Patient reports no  vision/ hearing changes,anorexia, weight change, fever ,adenopathy, persistant / recurrent hoarseness, swallowing issues, chest pain,palpitations, edema,persistant / recurrent cough, hemoptysis, dyspnea(rest, exertional, paroxysmal nocturnal), gastrointestinal  bleeding (melena, rectal bleeding), abdominal pain, excessive heart burn, GU symptoms( dysuria, hematuria, pyuria, voiding/incontinence  issues) syncope, focal weakness, memory loss,numbness & tingling, skin/hair/nail changes,depression, anxiety, or abnormal bruising/bleeding. He has chronic R hip pain , worse with certain tennis moves.     Objective:   Physical Exam Gen.:Thin but healthy and well-nourished in appearance. Alert, appropriate and cooperative throughout exam. Head: Normocephalic without obvious abnormalities  Eyes: No corneal or conjunctival inflammation noted. Pupils equal round reactive to light and accommodation. Fundal exam is benign without hemorrhages, exudate, papilledema. Extraocular motion intact. OD vision grossly decreased. Ears: External  ear exam reveals no significant lesions or deformities. Canals clear .TMs normal. Hearing is grossly normal bilaterally. Nose: External nasal exam reveals no deformity or inflammation. Nasal mucosa are pink and moist. No lesions or exudates noted.  Mouth: Oral mucosa and oropharynx reveal no lesions or exudates. Teeth in good repair. Neck: No deformities, masses, or tenderness noted. Range of motion decreased. Thyroid normal. Lungs: Normal respiratory effort; chest expands symmetrically. Lungs are clear to auscultation without rales, wheezes, or increased work of breathing. Heart: Slow rate and regular  rhythm. Accentuated  S1 ; normal  S2. No gallop, click, or rub. S4 w/o  murmur. Abdomen: Bowel sounds normal; abdomen soft and nontender. No masses, organomegaly or hernias noted. Genitalia: Dr Timothy Bentley                                                                             Musculoskeletal/extremities: No deformity or scoliosis noted of  the thoracic or lumbar spine; but there is some asymmetry of the posterior thoracic musculature suggesting occult scoliosis. . No clubbing, cyanosis, edema, or deformity noted. Range of motion  normal .Tone & strength  normal.Joints normal. Nail health  good. Vascular: Carotid, radial artery, dorsalis pedis and  posterior tibial pulses are full and equal. No bruits present. Neurologic: Alert and oriented x3. Deep tendon reflexes symmetrical and normal.          Skin: Intact without suspicious lesions or rashes. Tanned Lymph: No cervical, axillary, or inguinal lymphadenopathy present. Psych: Mood and affect are normal. Normally interactive                                                                                         Assessment & Plan:  #1 comprehensive physical exam; no acute findings #2 see Problem List with Assessments & Recommendations Plan: see Orders

## 2012-11-20 ENCOUNTER — Other Ambulatory Visit: Payer: Self-pay

## 2013-03-28 ENCOUNTER — Other Ambulatory Visit: Payer: Self-pay | Admitting: Chiropractic Medicine

## 2013-03-28 ENCOUNTER — Ambulatory Visit
Admission: RE | Admit: 2013-03-28 | Discharge: 2013-03-28 | Disposition: A | Discharge status: Home / Self Care | Payer: BC Managed Care – PPO | Source: Ambulatory Visit | Attending: Chiropractic Medicine | Admitting: Chiropractic Medicine

## 2013-03-28 DIAGNOSIS — M25551 Pain in right hip: Secondary | ICD-10-CM

## 2013-05-11 ENCOUNTER — Ambulatory Visit (INDEPENDENT_AMBULATORY_CARE_PROVIDER_SITE_OTHER): Payer: BC Managed Care – PPO | Admitting: Internal Medicine

## 2013-05-11 ENCOUNTER — Encounter: Payer: Self-pay | Admitting: Internal Medicine

## 2013-05-11 VITALS — BP 118/70 | HR 66 | Temp 98.1°F | Resp 12 | Ht 69.5 in | Wt 161.0 lb

## 2013-05-11 DIAGNOSIS — D696 Thrombocytopenia, unspecified: Secondary | ICD-10-CM | POA: Insufficient documentation

## 2013-05-11 DIAGNOSIS — Z0000 Encounter for general adult medical examination without abnormal findings: Secondary | ICD-10-CM

## 2013-05-11 DIAGNOSIS — Z23 Encounter for immunization: Secondary | ICD-10-CM

## 2013-05-11 DIAGNOSIS — Z1331 Encounter for screening for depression: Secondary | ICD-10-CM

## 2013-05-11 LAB — CBC WITH DIFFERENTIAL/PLATELET
Basophils Relative: 0.3 % (ref 0.0–3.0)
Eosinophils Relative: 4.3 % (ref 0.0–5.0)
HCT: 48 % (ref 39.0–52.0)
Lymphs Abs: 1 10*3/uL (ref 0.7–4.0)
MCV: 90.9 fl (ref 78.0–100.0)
Monocytes Absolute: 0.4 10*3/uL (ref 0.1–1.0)
Neutro Abs: 2.3 10*3/uL (ref 1.4–7.7)
Platelets: 119 10*3/uL — ABNORMAL LOW (ref 150.0–400.0)
WBC: 3.8 10*3/uL — ABNORMAL LOW (ref 4.5–10.5)

## 2013-05-11 LAB — BASIC METABOLIC PANEL
CO2: 30 mEq/L (ref 19–32)
Chloride: 104 mEq/L (ref 96–112)
Creatinine, Ser: 0.9 mg/dL (ref 0.4–1.5)

## 2013-05-11 LAB — HEPATIC FUNCTION PANEL
AST: 50 U/L — ABNORMAL HIGH (ref 0–37)
Alkaline Phosphatase: 60 U/L (ref 39–117)
Bilirubin, Direct: 0.2 mg/dL (ref 0.0–0.3)
Total Bilirubin: 1.1 mg/dL (ref 0.3–1.2)

## 2013-05-11 LAB — TSH: TSH: 1.17 u[IU]/mL (ref 0.35–5.50)

## 2013-05-11 LAB — LIPID PANEL
LDL Cholesterol: 86 mg/dL (ref 0–99)
VLDL: 8.4 mg/dL (ref 0.0–40.0)

## 2013-05-11 NOTE — Patient Instructions (Addendum)
Consider glucosamine sulfate 1500 mg daily for joint symptoms. Take this daily  for 3 months and then leave it off for 2 months. This will rehydrate the soft tissues & cartilages.

## 2013-05-11 NOTE — Progress Notes (Signed)
  Subjective:    Patient ID: Timothy Bentley, male    DOB: [birth year], 62 y.o.   MRN: [id number]  HPI  [name] is here for a physical;acute issues include R hip DJD for which he is seeing a Land. He also has some myalgias in legs.     Review of Systems     Objective:   Physical Exam Gen.:Thin but healthy and well-nourished in appearance. Alert, appropriate and cooperative throughout exam. Head: Normocephalic without obvious abnormalities;pattern alopecia  Eyes: No corneal or conjunctival inflammation noted. Pupils unequal OS > OD (past hx trauma). Extraocular motion intact. Vision grossly decreased OD Ears: External  ear exam reveals no significant lesions or deformities. Canals clear .TMs normal. Hearing is grossly normal bilaterally. Nose: External nasal exam reveals no deformity or inflammation. Nasal mucosa are pink and moist. No lesions or exudates noted.   Mouth: Oral mucosa and oropharynx reveal no lesions or exudates. Teeth in good repair. Slight crepitus TMJ Neck: No deformities, masses, or tenderness noted. Range of motion decreased. Thyroid normal. Lungs: Normal respiratory effort; chest expands symmetrically. Lungs are clear to auscultation without rales, wheezes, or increased work of breathing. Heart: Normal rate and rhythm. Normal S1 and S2. No gallop, click, or rub. S4 w/o murmur. Abdomen: Bowel sounds normal; abdomen soft and nontender. No masses, organomegaly or hernias noted. Genitalia: Genitalia normal except for bilateral small epididymal granulomas left varices. Prostate is minimally  Asymmetric without enlargement, nodularity, or induration.                               Musculoskeletal/extremities: No deformity or scoliosis noted of  the thoracic or lumbar spine.  No clubbing, cyanosis, edema, or significant extremity  deformity noted. Range of motion normal but pain @ R hip .Tone & strength  Normal. Joints  reveal mild  DJD DIP changes. Nail health good. Able to  lie down & sit up w/o help. Negative SLR bilaterally Vascular: Carotid, radial artery, dorsalis pedis and  posterior tibial pulses are full and equal. No bruits present. Neurologic: Alert and oriented x3. Deep tendon reflexes symmetrical and normal.         Skin: Intact without suspicious lesions or rashes. Tanned Lymph: No cervical, axillary, or inguinal lymphadenopathy present. Psych: Mood and affect are normal. Normally interactive                                                                                       Assessment & Plan:  #1 comprehensive physical exam; no acute findings #2 OA/DJD #3 myalgias  Plan: see Orders  & Recommendations

## 2013-08-11 ENCOUNTER — Other Ambulatory Visit: Payer: Self-pay

## 2013-09-13 ENCOUNTER — Encounter: Payer: Self-pay | Admitting: Family Medicine

## 2013-09-13 ENCOUNTER — Telehealth: Payer: Self-pay | Admitting: *Deleted

## 2013-09-13 ENCOUNTER — Ambulatory Visit (INDEPENDENT_AMBULATORY_CARE_PROVIDER_SITE_OTHER): Payer: BC Managed Care – PPO | Admitting: Family Medicine

## 2013-09-13 VITALS — BP 140/82 | HR 103 | Temp 102.8°F | Wt 166.6 lb

## 2013-09-13 DIAGNOSIS — H6690 Otitis media, unspecified, unspecified ear: Secondary | ICD-10-CM

## 2013-09-13 DIAGNOSIS — R05 Cough: Secondary | ICD-10-CM

## 2013-09-13 DIAGNOSIS — H6691 Otitis media, unspecified, right ear: Secondary | ICD-10-CM

## 2013-09-13 DIAGNOSIS — R059 Cough, unspecified: Secondary | ICD-10-CM

## 2013-09-13 DIAGNOSIS — J111 Influenza due to unidentified influenza virus with other respiratory manifestations: Secondary | ICD-10-CM

## 2013-09-13 MED ORDER — AMOXICILLIN-POT CLAVULANATE 875-125 MG PO TABS: 1.0000 | ORAL_TABLET | Freq: Two times a day (BID) | ORAL | Status: DC

## 2013-09-13 MED ORDER — OSELTAMIVIR PHOSPHATE 75 MG PO CAPS: 75.0000 mg | ORAL_CAPSULE | Freq: Two times a day (BID) | ORAL | Status: DC

## 2013-09-13 MED ORDER — GUAIFENESIN-CODEINE 100-10 MG/5ML PO SYRP: ORAL_SOLUTION | ORAL | Status: DC

## 2013-09-13 NOTE — Progress Notes (Signed)
   Subjective:    Patient ID: Timothy Bentley, male    DOB: 28-Sep-1951, 62 y.o.   MRN: [id number]  HPI    Review of Systems     Objective:   Physical Exam        Assessment & Plan:   Subjective:     Timothy Bentley is a 62 y.o. male who presents for evaluation of symptoms of a URI. Symptoms include bilateral ear pressure/pain, achiness, congestion, cough described as productive, fever 102 and sinus pressure. Onset of symptoms was 1 week ago, and has been gradually worsening since that time. Treatment to date: none.  The following portions of the patient's history were reviewed and updated as appropriate: allergies, current medications, past family history, past medical history, past social history, past surgical history and problem list.  Review of Systems Pertinent items are noted in HPI.   Objective:    BP 140/82  Pulse 103  Temp(Src) 102.8 F (39.3 C) (Oral)  Wt 166 lb 9.6 oz (75.569 kg)  SpO2 97% General appearance: alert, cooperative, appears stated age and no distress Ears: abnormal TM left ear - erythematous and bulging Nose: Nares normal. Septum midline. Mucosa normal. No drainage or sinus tenderness., mild congestion, sinus tenderness bilateral Throat: lips, mucosa, and tongue normal; teeth and gums normal Neck: no adenopathy, supple, symmetrical, trachea midline and thyroid not enlarged, symmetric, no tenderness/mass/nodules Lungs: diminished breath sounds bilaterally and wheezes bilaterally Heart: S1, S2 normal   Assessment:    bronchitis   Plan:    Suggested symptomatic OTC remedies. Nasal saline spray for congestion. Augmentin per orders. Nasal steroids per orders. Follow up as needed.

## 2013-09-13 NOTE — Patient Instructions (Signed)

## 2013-09-13 NOTE — Telephone Encounter (Signed)
Received message on triage line from patient stating he is having flu-like symptoms. I returned call and left a message for patient to please call back and we could make an appt today with Dr. Laury Axon.

## 2013-09-13 NOTE — Telephone Encounter (Signed)
Patient returned phone call and an appt with Dr. Laury Axon at 3 pm was scheduled for today.

## 2013-09-19 ENCOUNTER — Encounter: Payer: Self-pay | Admitting: Internal Medicine

## 2013-09-19 ENCOUNTER — Ambulatory Visit (INDEPENDENT_AMBULATORY_CARE_PROVIDER_SITE_OTHER): Payer: BC Managed Care – PPO | Admitting: Internal Medicine

## 2013-09-19 ENCOUNTER — Ambulatory Visit (INDEPENDENT_AMBULATORY_CARE_PROVIDER_SITE_OTHER)
Admission: RE | Admit: 2013-09-19 | Discharge: 2013-09-19 | Disposition: A | Discharge status: Home / Self Care | Payer: BC Managed Care – PPO | Source: Ambulatory Visit | Attending: Internal Medicine | Admitting: Internal Medicine

## 2013-09-19 VITALS — BP 122/76 | HR 86 | Temp 98.4°F | Ht 69.5 in | Wt 159.6 lb

## 2013-09-19 DIAGNOSIS — J209 Acute bronchitis, unspecified: Secondary | ICD-10-CM

## 2013-09-19 DIAGNOSIS — B1710 Acute hepatitis C without hepatic coma: Secondary | ICD-10-CM

## 2013-09-19 LAB — CBC WITH DIFFERENTIAL/PLATELET
Eosinophils Relative: 1.1 % (ref 0.0–5.0)
MCV: 88.8 fl (ref 78.0–100.0)
Monocytes Absolute: 1.1 10*3/uL — ABNORMAL HIGH (ref 0.1–1.0)
Monocytes Relative: 8.1 % (ref 3.0–12.0)
Neutrophils Relative %: 83.2 % — ABNORMAL HIGH (ref 43.0–77.0)
Platelets: 278 10*3/uL (ref 150.0–400.0)
WBC: 13.8 10*3/uL — ABNORMAL HIGH (ref 4.5–10.5)

## 2013-09-19 LAB — HEPATIC FUNCTION PANEL
ALT: 24 U/L (ref 0–53)
Bilirubin, Direct: 0.2 mg/dL (ref 0.0–0.3)
Total Bilirubin: 0.7 mg/dL (ref 0.3–1.2)

## 2013-09-19 MED ORDER — BUDESONIDE-FORMOTEROL FUMARATE 160-4.5 MCG/ACT IN AERO: 2.0000 | INHALATION_SPRAY | Freq: Two times a day (BID) | RESPIRATORY_TRACT | Status: DC

## 2013-09-19 MED ORDER — PREDNISONE 20 MG PO TABS: 20.0000 mg | ORAL_TABLET | Freq: Two times a day (BID) | ORAL | Status: DC

## 2013-09-19 NOTE — Patient Instructions (Addendum)
Order for x-rays entered into  the computer; these will be performed at [address] [facility]. across from [facility]. No appointment is necessary. Your next office appointment will be determined based upon review of your pending labs & x-rays. Those instructions will be transmitted to you through My Chart.  Please report any significant change in your symptoms. Symbicort  two inhalations every 12 hours; gargle and spit after use

## 2013-09-19 NOTE — Progress Notes (Signed)
Pre visit review using our clinic review tool, if applicable. No additional management support is needed unless otherwise documented below in the visit note. 

## 2013-09-19 NOTE — Progress Notes (Signed)
   Subjective:    Patient ID: Timothy Bentley, male    DOB: 1951-01-21, 62 y.o.   MRN: [id number]  HPI He returns after completing the broad-spectrum antibiotic for right otitis media. He was given Tamiflu because of the inconclusive flu test. He was also given a narcotic cough syrup for his cough.  The cough persist as a dry, hacking, continuous cough associated with lacerating discomfort.  See symptoms had begun 12/2 with temperatures up to 103. He did not take the flu shot.  He has no history of asthma and has never smoked.    Review of Systems   At this time he denies fever, chills, or sweats.  He has no frontal headache, facial pain, nasal purulence, otic pain, or otic discharge.  The cough is not associated with wheezing or shortness of breath.  He is VERY concerned about med effects on his Hepatitis C     Objective:   Physical Exam General appearance: very fatigued but in good health ;well nourished; no acute distress or increased work of breathing is present.  No  lymphadenopathy about the head, neck, or axilla noted.   Eyes: No conjunctival inflammation or lid edema is present. There is no scleral icterus.  Ears:  External ear exam shows no significant lesions or deformities.  Otoscopic examination reveals clear canals, tympanic membranes are intact bilaterally without bulging, retraction, or discharge. Mild hyperemia of the superior left tympanic membrane which suggest barotrauma from coughing rather than otitis.  Nose:  External nasal examination shows no deformity or inflammation. Nasal mucosa are pink and moist without lesions or exudates. Minimal R  septal dislocation .No obstruction to airflow.   Oral exam: Dental hygiene is good; lips and gums are healthy appearing.There is no oropharyngeal erythema or exudate noted. Slightly hoarse.  Neck:  No deformities,  masses, or tenderness noted.   Supple with full range of motion without pain.   Heart:  Normal rate and regular  rhythm. S1 and S2 normal without gallop, murmur, click, rub or other extra sounds.   Lungs: Diffuse low-grade rales and rhonchi greatest at the left lower base and right anterior chest.  Extremities:  No cyanosis, edema, or clubbing  noted    Skin: Warm & dry w/o jaundice or tenting.         Assessment & Plan:  #1 protracted bronchitis with breath sounds as noted above #2 Hepatitis C; med risk discussed  Plan: See orders

## 2013-09-21 ENCOUNTER — Ambulatory Visit (INDEPENDENT_AMBULATORY_CARE_PROVIDER_SITE_OTHER): Payer: BC Managed Care – PPO | Admitting: Internal Medicine

## 2013-09-21 ENCOUNTER — Encounter: Payer: Self-pay | Admitting: Internal Medicine

## 2013-09-21 VITALS — BP 113/72 | HR 89 | Temp 98.3°F | Wt 158.6 lb

## 2013-09-21 DIAGNOSIS — H6980 Other specified disorders of Eustachian tube, unspecified ear: Secondary | ICD-10-CM

## 2013-09-21 DIAGNOSIS — H6983 Other specified disorders of Eustachian tube, bilateral: Secondary | ICD-10-CM

## 2013-09-21 DIAGNOSIS — J209 Acute bronchitis, unspecified: Secondary | ICD-10-CM

## 2013-09-21 MED ORDER — GUAIFENESIN-CODEINE 100-10 MG/5ML PO SYRP: 5.0000 mL | ORAL_SOLUTION | Freq: Three times a day (TID) | ORAL | Status: DC | PRN

## 2013-09-21 NOTE — Progress Notes (Signed)
Pre visit review using our clinic review tool, if applicable. No additional management support is needed unless otherwise documented below in the visit note. 

## 2013-09-21 NOTE — Progress Notes (Signed)
   Subjective:    Patient ID: Timothy Bentley, male    DOB: December 25, 1950, 62 y.o.   MRN: [id number]  HPI   He has run out of the cough syrup he was using to help sleep. He was taking this while he was taking Tamiflu for apparent flulike illness. He continues to cough but it is now more productive. The mucus is described as white/yellow. He also had paroxysms of dry cough. He can suppress a cough for a while but then it "burst out". He does feel like he is getting better. The bronchodilator/anti-inflammatory inhaler & prednisone were not employed as he has concerns about adverse med effects.  He continues to have pressure in his ears without pain or discharge.He has significant clear rhinitis  His chest x-ray was reviewed; he does have some increased lung markings in the bases but there is no pneumonia present.  The white count revealed significant elevation of the neutrophils with a left shift, suggesting a bacterial process. This is significantly different than his usual low white count lymphocytes in the 20 to-25% range.    Review of Systems   He denies frontal sinus or maxillary sinus area pain or nasal purulence. He's having no dental pain. He also denies fever, chills or sweats     Objective:   Physical Exam General appearance:thin but in good health ;well nourished; no acute distress or increased work of breathing is present.  No  lymphadenopathy about the head, neck, or axilla noted.   Eyes: No conjunctival inflammation or lid edema is present. There is no scleral icterus.  Ears:  External ear exam shows no significant lesions or deformities.  Otoscopic examination reveals clear canals. The left tympanic membrane suggests vascular changes from barotrauma. There is no exudate or evidence of active infection. Nose:  External nasal examination shows no deformity or inflammation. Nasal mucosa are pink and moist without lesions or exudates. No septal dislocation or deviation.No obstruction to  airflow.   Oral exam: Dental hygiene is good; lips and gums are healthy appearing.There is no oropharyngeal erythema or exudate noted.   Neck:  No deformities,  masses, or tenderness noted.   Supple with full range of motion without pain.   Heart:  Normal rate and regular rhythm. S1 and S2 normal without gallop, murmur, click, rub or other extra sounds.   Lungs: Chest is much clearer; he continues to have very low-grade rhonchi. The more dramatic bronchospastic changes heard previously are improved  Extremities:  No cyanosis, edema, or clubbing  noted    Skin: Warm & dry w/o jaundice or tenting.         Assessment & Plan:  #1 bronchitis with scant sputum production, yellowish-white  #2 bronchospasm, improved  #3 eustachian tube dysfunction from edema.  Plan: See orders

## 2013-09-21 NOTE — Patient Instructions (Signed)

## 2013-11-22 ENCOUNTER — Emergency Department (HOSPITAL_COMMUNITY)
Admission: EM | Admit: 2013-11-22 | Discharge: 2013-11-22 | Disposition: A | Discharge status: Home / Self Care | Payer: BC Managed Care – PPO | Attending: Emergency Medicine | Admitting: Emergency Medicine

## 2013-11-22 ENCOUNTER — Encounter (HOSPITAL_COMMUNITY): Payer: Self-pay | Admitting: Emergency Medicine

## 2013-11-22 DIAGNOSIS — W268XXA Contact with other sharp object(s), not elsewhere classified, initial encounter: Secondary | ICD-10-CM | POA: Insufficient documentation

## 2013-11-22 DIAGNOSIS — Y9389 Activity, other specified: Secondary | ICD-10-CM | POA: Insufficient documentation

## 2013-11-22 DIAGNOSIS — S61319A Laceration without foreign body of unspecified finger with damage to nail, initial encounter: Secondary | ICD-10-CM

## 2013-11-22 DIAGNOSIS — Z8619 Personal history of other infectious and parasitic diseases: Secondary | ICD-10-CM | POA: Insufficient documentation

## 2013-11-22 DIAGNOSIS — Y929 Unspecified place or not applicable: Secondary | ICD-10-CM | POA: Insufficient documentation

## 2013-11-22 DIAGNOSIS — Z8719 Personal history of other diseases of the digestive system: Secondary | ICD-10-CM | POA: Insufficient documentation

## 2013-11-22 DIAGNOSIS — Z862 Personal history of diseases of the blood and blood-forming organs and certain disorders involving the immune mechanism: Secondary | ICD-10-CM | POA: Insufficient documentation

## 2013-11-22 DIAGNOSIS — S61209A Unspecified open wound of unspecified finger without damage to nail, initial encounter: Secondary | ICD-10-CM | POA: Insufficient documentation

## 2013-11-22 DIAGNOSIS — Z23 Encounter for immunization: Secondary | ICD-10-CM | POA: Insufficient documentation

## 2013-11-22 DIAGNOSIS — Z87442 Personal history of urinary calculi: Secondary | ICD-10-CM | POA: Insufficient documentation

## 2013-11-22 MED ORDER — TETANUS-DIPHTH-ACELL PERTUSSIS 5-2.5-18.5 LF-MCG/0.5 IM SUSP
0.5000 mL | Freq: Once | INTRAMUSCULAR | Status: AC
  Administered 2013-11-22: 0.5 mL via INTRAMUSCULAR
  Filled 2013-11-22: qty 0.5

## 2013-11-22 NOTE — ED Notes (Signed)
Pt a+ox4, pt reports cutting vegetables tonight and cut tip of L 2nd digit.  Pt with approx 1/4" piece of finger tip missing, oozing blood.  Pt denies other complaints.  Skin otherwise PWD.  MAEI.  Speaking full/clear sentences.  NAD.

## 2013-11-22 NOTE — ED Provider Notes (Signed)
CSN: [id number]     Arrival date & time 11/22/13  1908 History   First MD Initiated Contact with Patient 11/22/13 2009     Chief Complaint  Patient presents with  . Extremity Laceration     (Consider location/radiation/quality/duration/timing/severity/associated sxs/prior Treatment) HPI History provided by pt.   Pt was slicing vegetables this evening and accidentally cut the tip of left index finger.  Cleaned w/ peroxide and soap and water.  Bleeding controlled.  Pain minimal currently and sensation intact.  Tetanus is out of date.  Immunocompetent. Past Medical History  Diagnosis Date  . Hepatitis C     Genotype 23 in 2000; seen @ [facility], treatment declined  . Nephrolithiasis 2002    X 1  . Diverticulosis     Dr Collene Mares  . Anal condyloma   . Thrombocytopenia 2010    Platelet count 99,000   Past Surgical History  Procedure Laterality Date  . Wisdom tooth extraction    . Flexible sigmoidoscopy  2012    Dr Collene Mares for evaluation of venereal warts  . Venereal wart resection  2012    Dr Zella Richer  . Excision anal condyloma  02/05/12    Dr Zella Richer  . Colonoscopy  2004    Tics   Family History  Problem Relation Age of Onset  . Meniere's disease Father   . Alcohol abuse Father   . Coronary artery disease Father     S/P CABG , > 55  . Lung cancer Maternal Uncle     smoker  . Diabetes Neg Hx   . Stroke Neg Hx    History  Substance Use Topics  . Smoking status: Never Smoker   . Smokeless tobacco: Never Used  . Alcohol Use: No    Review of Systems  All other systems reviewed and are negative.      Allergies  Review of patient's allergies indicates no known allergies.  Home Medications   Current Outpatient Rx  Name  Route  Sig  Dispense  Refill  . ibuprofen (ADVIL,MOTRIN) 200 MG tablet   Oral   Take 400 mg by mouth every 6 (six) hours as needed (pain).          BP 168/92  Pulse 87  Temp(Src) 97.8 F (36.6 C) (Oral)  Resp 20  SpO2 97% Physical  Exam  Nursing note and vitals reviewed. Constitutional: He is oriented to person, place, and time. He appears well-developed and well-nourished. No distress.  HENT:  Head: Normocephalic and atraumatic.  Eyes:  Normal appearance  Neck: Normal range of motion.  Pulmonary/Chest: Effort normal.  Musculoskeletal: Normal range of motion.  Superficial fingertip avulsion affecting distal lateral corner of nailbed of left index finger.  1cm diameter.  Hemostatic. Sensation intact.   Neurological: He is alert and oriented to person, place, and time.  Psychiatric: He has a normal mood and affect. His behavior is normal.    ED Course  Procedures (including critical care time) Labs Review Labs Reviewed - No data to display Imaging Review No results found.  EKG Interpretation   None       MDM   Final diagnoses:  Laceration of finger nail bed    63yo healthy M presents w/ superficial fingertip avulsion.  Wound cleaned and bandaged by nursing staff.  Tetanus updated.  Signs of infection discussed.     Remer Macho, PA-C 11/22/13 2018

## 2013-11-22 NOTE — Discharge Instructions (Signed)
Take ibuprofen or tylenol as needed for pain.  Keep wound clean and dry.  If wound begins to bleed again, apply firm pressure for 5-10 minutes while elevating hand.   You can also try an ice pack.  You should return to ED or see your doctor right away if you develop fever, worsening pain or redness/drainage of pus at site of wound.     Finger Avulsion  When the tip of the finger is lost, a new nail may grow back if part of the fingernail is left. The new nail may be deformed. If just the tip of the finger is lost, no repair may be needed unless there is bone showing. If bone is showing, your caregiver may need to remove the protruding bone and put on a bandage. Your caregiver will do what is best for you. Most of the time when a fingertip is lost, the end will gradually grow back on and look fairly normal, but it may remain sensitive to pressure and temperature extremes for a long time. HOME CARE INSTRUCTIONS   Keep your hand elevated above your heart to relieve pain and swelling.  Keep your dressing dry and clean.  Change your bandage in 24 hours or as directed.  Only take over-the-counter or prescription medicines for pain, discomfort, or fever as directed by your caregiver.  See your caregiver as needed for problems. SEEK MEDICAL CARE IF:   You have increased pain, swelling, drainage, or bleeding.  You have a fever.  You have swelling that spreads from your finger and into your hand. Make sure to check to see if you need a tetanus booster. Document Released: 12/01/2001 Document Revised: 03/23/2012 Document Reviewed: 10/26/2008 [facility] Patient Information 2014 [place], [place].

## 2013-11-26 NOTE — ED Provider Notes (Signed)
Medical screening examination/treatment/procedure(s) were performed by non-physician practitioner and as supervising physician I was immediately available for consultation/collaboration.  EKG Interpretation   None        Virgel Manifold, MD 11/26/13 [phone]

## 2014-04-19 ENCOUNTER — Other Ambulatory Visit: Payer: Self-pay | Admitting: Dermatology

## 2014-06-14 ENCOUNTER — Ambulatory Visit (INDEPENDENT_AMBULATORY_CARE_PROVIDER_SITE_OTHER): Payer: BC Managed Care – PPO | Admitting: Internal Medicine

## 2014-06-14 ENCOUNTER — Encounter: Payer: Self-pay | Admitting: Internal Medicine

## 2014-06-14 VITALS — BP 160/80 | HR 71 | Temp 98.9°F | Ht 69.0 in | Wt 164.4 lb

## 2014-06-14 DIAGNOSIS — M169 Osteoarthritis of hip, unspecified: Secondary | ICD-10-CM | POA: Insufficient documentation

## 2014-06-14 DIAGNOSIS — M1610 Unilateral primary osteoarthritis, unspecified hip: Secondary | ICD-10-CM

## 2014-06-14 DIAGNOSIS — B1710 Acute hepatitis C without hepatic coma: Secondary | ICD-10-CM

## 2014-06-14 DIAGNOSIS — M160 Bilateral primary osteoarthritis of hip: Secondary | ICD-10-CM

## 2014-06-14 DIAGNOSIS — Z0000 Encounter for general adult medical examination without abnormal findings: Secondary | ICD-10-CM

## 2014-06-14 NOTE — Progress Notes (Signed)
Subjective:    Patient ID: Timothy Bentley, male    DOB: 02/11/1951, 63 y.o.   MRN: [id number]  HPI   He is here for a physical;acute issues include degenerative joint disease of hips. He has seen an orthopedist who states that surgery can be deferred at this time. This is resulting in pain as well as decreased range of motion. 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  He is no longer having any lumbar radiculopathy symptoms; this has been controlled or resolved with stretching exercises.  He is interested in exploring new treatment options for Hepatitis C.    Review of Systems Fever, chills, sweats, or unexplained weight loss not present. There is no numbness, tingling, or weakness in extremities.  No loss of control of bladder or bowels. Radicular type pain absent.      Objective:   Physical Exam Gen.: Thin but healthy and well-nourished in appearance. Alert, appropriate and cooperative throughout exam. Appears younger than stated age  Head: Normocephalic without obvious abnormalities;  pattern alopecia  Eyes: No corneal or conjunctival inflammation noted. Pupils equal round reactive to light and accommodation. Extraocular motion intact.  Ears: External  ear exam reveals no significant lesions or deformities. Canals clear .TMs normal. Hearing is grossly normal bilaterally. Nose: External nasal exam reveals no deformity or inflammation. Nasal mucosa are pink and moist. No lesions or exudates noted.   Mouth: Oral mucosa and oropharynx reveal no lesions or exudates. Teeth in good repair. Neck: No deformities, masses, or tenderness noted. Range of motion decreased. Thyroid normal. Lungs: Normal respiratory effort; chest expands symmetrically. Lungs are clear to auscultation without rales, wheezes, or increased work of breathing. Heart: Normal rate and rhythm. Accentuated S1; normal S2. No gallop, click, or rub. No murmur. Recheck BP 135/85 Abdomen:  Bowel sounds normal; abdomen soft and nontender. No masses, organomegaly or hernias noted. Genitalia: Genitalia normal except for left varices. Prostate is normal without enlargement, asymmetry, nodularity, or induration                           Musculoskeletal/extremities: No deformity or scoliosis noted of  the thoracic or lumbar spine.  No clubbing, cyanosis, edema, or significant extremity  deformity noted. Range of motion slightly decreased @ hips & knees.Tone & strength normal. Hand joints reveal mild isolated DJD DIP changes.  Fingernail health good. Able to lie down & sit up w/o help. Negative SLR bilaterally Vascular: Carotid, radial artery, dorsalis pedis and  posterior tibial pulses are full and equal. No bruits present. Neurologic: Alert and oriented x3. Deep tendon reflexes symmetrical and normal.  Gait normal.       Skin: Intact without suspicious lesions or rashes. Lymph: No cervical, axillary, or inguinal lymphadenopathy present. Psych: Mood and affect are normal. Normally interactive                                                                                        Assessment & Plan:  #1 comprehensive physical exam; no acute findings #2 elevated BP w/o dx of HTN  Plan:  see Orders  & Recommendations

## 2014-06-14 NOTE — Patient Instructions (Addendum)
Your next office appointment will be determined based upon review of your pending labs . Those instructions will be transmitted to you through My Chart .   Consider glucosamine sulfate 1500 mg daily for joint symptoms. Take this daily  for 3 months and then leave it off for 2 months. This will rehydrate the cartilages.  Minimal Blood Pressure Goal= AVERAGE < 140/90;  Ideal is an AVERAGE < 135/85. This AVERAGE should be calculated from @ least 5-7 BP readings taken @ different times of day on different days of week. You should not respond to isolated BP readings , but rather the AVERAGE for that week .Please bring your  blood pressure cuff to office visits to verify that it is reliable.It  can also be checked against the blood pressure device at the pharmacy. Finger or wrist cuffs are not dependable; an arm cuff is.

## 2014-06-14 NOTE — Progress Notes (Signed)
Pre visit review using our clinic review tool, if applicable. No additional management support is needed unless otherwise documented below in the visit note. 

## 2014-07-21 ENCOUNTER — Other Ambulatory Visit: Payer: Self-pay

## 2014-09-04 ENCOUNTER — Telehealth: Payer: Self-pay | Admitting: Internal Medicine

## 2014-09-04 DIAGNOSIS — Z0000 Encounter for general adult medical examination without abnormal findings: Secondary | ICD-10-CM

## 2014-09-04 NOTE — Telephone Encounter (Signed)
Patient aware orders have been placed.

## 2014-09-04 NOTE — Telephone Encounter (Signed)
Pt requesting lab work that was ordered previously be Dr Linna Darner be reactivated or orders be put in computer, he went to lab last week and they orders were no longer active.   Pt requests call letting him know that he can go to lab.

## 2014-09-05 ENCOUNTER — Other Ambulatory Visit (INDEPENDENT_AMBULATORY_CARE_PROVIDER_SITE_OTHER): Payer: BC Managed Care – PPO

## 2014-09-05 ENCOUNTER — Telehealth: Payer: Self-pay

## 2014-09-05 DIAGNOSIS — R7309 Other abnormal glucose: Secondary | ICD-10-CM

## 2014-09-05 DIAGNOSIS — Z0000 Encounter for general adult medical examination without abnormal findings: Secondary | ICD-10-CM

## 2014-09-05 LAB — BASIC METABOLIC PANEL
BUN: 24 mg/dL — ABNORMAL HIGH (ref 6–23)
CHLORIDE: 104 meq/L (ref 96–112)
CO2: 26 meq/L (ref 19–32)
CREATININE: 0.8 mg/dL (ref 0.4–1.5)
Calcium: 9.2 mg/dL (ref 8.4–10.5)
GFR: 104.34 mL/min (ref >60.00)
Glucose, Bld: 104 mg/dL — ABNORMAL HIGH (ref 70–99)
Potassium: 4 mEq/L (ref 3.5–5.1)
Sodium: 138 mEq/L (ref 135–145)

## 2014-09-05 LAB — LIPID PANEL
CHOL/HDL RATIO: 4
Cholesterol: 153 mg/dL (ref 0–200)
HDL: 37.5 mg/dL — AB (ref >39.00)
LDL CALC: 108 mg/dL — AB (ref 0–99)
NonHDL: 115.5
TRIGLYCERIDES: 38 mg/dL (ref 0.0–149.0)
VLDL: 7.6 mg/dL (ref 0.0–40.0)

## 2014-09-05 LAB — CBC WITH DIFFERENTIAL/PLATELET
Basophils Absolute: 0 10*3/uL (ref 0.0–0.1)
Basophils Relative: 0.4 % (ref 0.0–3.0)
EOS ABS: 0.3 10*3/uL (ref 0.0–0.7)
Eosinophils Relative: 6.2 % — ABNORMAL HIGH (ref 0.0–5.0)
HCT: 49.7 % (ref 39.0–52.0)
Hemoglobin: 16.8 g/dL (ref 13.0–17.0)
Lymphocytes Relative: 20.5 % (ref 12.0–46.0)
Lymphs Abs: 0.9 10*3/uL (ref 0.7–4.0)
MCHC: 33.8 g/dL (ref 30.0–36.0)
MCV: 88.6 fl (ref 78.0–100.0)
MONO ABS: 0.4 10*3/uL (ref 0.1–1.0)
Monocytes Relative: 9.3 % (ref 3.0–12.0)
Neutro Abs: 2.9 10*3/uL (ref 1.4–7.7)
Neutrophils Relative %: 63.6 % (ref 43.0–77.0)
PLATELETS: 134 10*3/uL — AB (ref 150.0–400.0)
RBC: 5.61 Mil/uL (ref 4.22–5.81)
RDW: 13.3 % (ref 11.5–15.5)
WBC: 4.6 10*3/uL (ref 4.0–10.5)

## 2014-09-05 LAB — TSH: TSH: 2.54 u[IU]/mL (ref 0.35–4.50)

## 2014-09-05 LAB — HEPATIC FUNCTION PANEL
ALT: 27 U/L (ref 0–53)
AST: 25 U/L (ref 0–37)
Albumin: 4.5 g/dL (ref 3.5–5.2)
Alkaline Phosphatase: 70 U/L (ref 39–117)
BILIRUBIN DIRECT: 0.1 mg/dL (ref 0.0–0.3)
BILIRUBIN TOTAL: 0.5 mg/dL (ref 0.2–1.2)
Total Protein: 7.4 g/dL (ref 6.0–8.3)

## 2014-09-05 LAB — HEMOGLOBIN A1C: Hgb A1c MFr Bld: 5.3 % (ref 4.6–6.5)

## 2014-09-05 NOTE — Telephone Encounter (Signed)
Request for add on has been faxed to lab 

## 2014-09-05 NOTE — Telephone Encounter (Signed)
-----   Message from Hendricks Limes, MD sent at 09/05/2014  1:00 PM EST ----- Please add A1c (R73.9)

## 2014-09-06 LAB — HEPATITIS C RNA QUANTITATIVE
HCV Quantitative Log: 6.88 {Log} — ABNORMAL HIGH (ref <1.18)
HCV Quantitative: 7542432 IU/mL — ABNORMAL HIGH (ref <15)

## 2014-10-25 ENCOUNTER — Telehealth: Payer: Self-pay | Admitting: Internal Medicine

## 2014-10-25 ENCOUNTER — Encounter: Payer: Self-pay | Admitting: Internal Medicine

## 2014-10-25 NOTE — Telephone Encounter (Signed)
Labs and office notes have been faxed to [facility] [phone] as patient requested

## 2014-11-20 ENCOUNTER — Encounter: Payer: Self-pay | Admitting: Internal Medicine

## 2014-11-20 NOTE — Progress Notes (Signed)
Faxed hepatitis c labs to [facility] @ [phone]

## 2014-12-13 ENCOUNTER — Encounter: Payer: Self-pay | Admitting: Gastroenterology

## 2015-02-14 ENCOUNTER — Encounter: Payer: Self-pay | Admitting: Internal Medicine

## 2015-02-14 ENCOUNTER — Encounter: Payer: Self-pay | Admitting: Gastroenterology

## 2015-02-14 ENCOUNTER — Other Ambulatory Visit: Payer: BLUE CROSS/BLUE SHIELD

## 2015-02-14 ENCOUNTER — Ambulatory Visit (INDEPENDENT_AMBULATORY_CARE_PROVIDER_SITE_OTHER): Payer: BLUE CROSS/BLUE SHIELD | Admitting: Internal Medicine

## 2015-02-14 VITALS — BP 140/88 | HR 76 | Temp 98.7°F | Ht 69.0 in | Wt 162.2 lb

## 2015-02-14 DIAGNOSIS — B182 Chronic viral hepatitis C: Secondary | ICD-10-CM

## 2015-02-14 NOTE — Progress Notes (Signed)
   Subjective:    Patient ID: Timothy Bentley, male    DOB: 09-Oct-1950, 64 y.o.   MRN: [id number]  HPI He will be seen in hepatitis C clinic at [facility]. His hepatitis was diagnosed as "non-A, non-B" in 1985  on blood donation at the [facility]. He was seen in [facility] 2006 but elected not to have treatment. Our records indicate that he is genotype 23.  His most recent hepatitis HCV Quantitative studies revealed [id number]  (normal less than 15) and HCV quantitative log of 6.88 (normal less than 1.18).  He has no active constitutional or GI symptoms at this time.  He expressed anxiety about pursuing any treatment as he has "been in balance" with this issue for 25 years.  He has received a letter from Gastroenterology recommending a follow-up colonoscopy. The last one was negative in 2004.   Review of Systems Unexplained weight loss, abdominal pain, significant dyspepsia, dysphagia, melena, rectal bleeding, or persistently small caliber stools are denied.  He denies dark-colored urine or clay colored stool.  He has no fever, chills, or sweats.    Objective:   Physical Exam   General appearance :adequately nourished; in no distress.  Eyes: No conjunctival inflammation or scleral icterus is present.  Oral exam:  Lips and gums are healthy appearing.There is no oropharyngeal erythema or exudate noted. Dental hygiene is good.  Heart:  Normal rate and regular rhythm. S1 and S2 normal without gallop, murmur, click, rub or other extra sounds    Lungs:Chest clear to auscultation; no wheezes, rhonchi,rales ,or rubs present.No increased work of breathing.   Abdomen: bowel sounds normal, soft and non-tender without masses, organomegaly or hernias noted.  No guarding or rebound. No flank tenderness to percussion.  Vascular : all pulses equal ; no bruits present.  Skin:Warm & dry.  Intact without suspicious lesions or rashes ; no tenting or jaundice   Lymphatic: No lymphadenopathy is noted  about the head, neck, axilla  MS:He has minor crepitus of the knees.  Neuro: Strength, tone & DTRs normal.          Assessment & Plan:  See Current Assessment & Plan in Problem List under specific Diagnosis

## 2015-02-14 NOTE — Patient Instructions (Signed)
Share results with the [facility]  medical staff when seen

## 2015-02-14 NOTE — Assessment & Plan Note (Addendum)
Reconfirm Genotype As per Hepatitis C Clinic , [facility]

## 2015-02-14 NOTE — Progress Notes (Signed)
Pre visit review using our clinic review tool, if applicable. No additional management support is needed unless otherwise documented below in the visit note. 

## 2015-02-19 LAB — HEPATITIS C RNA QUANTITATIVE
HCV QUANT LOG: 6.22 {Log} — AB (ref <1.18)
HCV Quantitative: 1674330 IU/mL — ABNORMAL HIGH (ref <15)

## 2015-02-21 ENCOUNTER — Encounter: Payer: Self-pay | Admitting: Internal Medicine

## 2015-02-21 LAB — HEPATITIS C GENOTYPE: HCV Genotype: 2

## 2015-02-26 ENCOUNTER — Encounter: Payer: Self-pay | Admitting: Internal Medicine

## 2015-04-17 ENCOUNTER — Ambulatory Visit (AMBULATORY_SURGERY_CENTER): Payer: Self-pay

## 2015-04-17 VITALS — Ht 69.0 in | Wt 161.8 lb

## 2015-04-17 DIAGNOSIS — Z1211 Encounter for screening for malignant neoplasm of colon: Secondary | ICD-10-CM

## 2015-04-17 NOTE — Progress Notes (Signed)
Per pt, no allergies to soy or egg products.Pt not taking any weight loss meds or using  O2 at home. 

## 2015-04-18 ENCOUNTER — Encounter: Payer: Self-pay | Admitting: Gastroenterology

## 2015-05-01 ENCOUNTER — Encounter: Payer: BLUE CROSS/BLUE SHIELD | Admitting: Gastroenterology

## 2015-05-02 ENCOUNTER — Encounter: Payer: Self-pay | Admitting: Gastroenterology

## 2015-05-16 ENCOUNTER — Ambulatory Visit (INDEPENDENT_AMBULATORY_CARE_PROVIDER_SITE_OTHER): Payer: BLUE CROSS/BLUE SHIELD | Admitting: Internal Medicine

## 2015-05-16 ENCOUNTER — Encounter: Payer: Self-pay | Admitting: Internal Medicine

## 2015-05-16 VITALS — BP 130/75 | HR 70 | Temp 98.1°F | Resp 18 | Ht 69.0 in | Wt 166.0 lb

## 2015-05-16 DIAGNOSIS — Z0000 Encounter for general adult medical examination without abnormal findings: Secondary | ICD-10-CM | POA: Diagnosis not present

## 2015-05-16 NOTE — Patient Instructions (Signed)
Minimal Blood Pressure Goal= AVERAGE < 140/90;  Ideal is an AVERAGE < 135/85. This AVERAGE should be calculated from @ least 5 BP readings taken @ different times of day on different days of week. You should not respond to isolated BP readings , but rather the AVERAGE for that week .Finger or wrist cuffs are not dependable; an arm cuff is.  Your next office appointment will be determined based upon review of your pending labs.  Those written interpretation of the lab results and instructions will be transmitted to you by My Chart Critical results will be called. Followup as needed for any active or acute issue. Please report any significant change in your symptoms.

## 2015-05-16 NOTE — Progress Notes (Signed)
Pre visit review using our clinic review tool, if applicable. No additional management support is needed unless otherwise documented below in the visit note. 

## 2015-05-16 NOTE — Progress Notes (Signed)
   Subjective:    Patient ID: Timothy Bentley, male    DOB: 07-Jun-1951, 64 y.o.   MRN: [id number]  HPI He is here for a physical;acute issues denied. He is on a heart healthy diet; he does add some salt. He exercises at a high level for at least an hour a day 6 days a week. He has no associated cardio pulmonary symptoms  He's had to reschedule his colonoscopies; it is set for October 2016. He has no active GI symptoms  Other than some degenerative joint related pain in his hip and nocturia 1-2 times per night; he has no symptoms.  To date he has not followed up with the Hepatitis C clinic.  Review of Systems  Chest pain, palpitations, tachycardia, exertional dyspnea, paroxysmal nocturnal dyspnea, claudication or edema are absent. No unexplained weight loss, abdominal pain, significant dyspepsia, dysphagia, melena, rectal bleeding, or persistently small caliber stools. Dysuria, pyuria, hematuria, frequency, or polyuria are denied. Change in hair, skin, nails denied. No bowel changes of constipation or diarrhea. No intolerance to heat or cold.      Objective:   Physical Exam Pertinent or positive findings include: Pattern alopecia is present. He has minor isolated DIP changes mainly in the index fingers. There is thickening and hyperpigmentation of the skin over the knees. Relatively large varices present in the left scrotum. There is a slight bulge in the left inguinal canal without fixed hernia. Prostate is normal.  General appearance :adequately nourished; in no distress.  Eyes: No conjunctival inflammation or scleral icterus is present.  Oral exam:  Lips and gums are healthy appearing.There is no oropharyngeal erythema or exudate noted. Dental hygiene is good.  Heart:  Normal rate and regular rhythm. S1 and S2 normal without gallop, murmur, click, rub or other extra sounds    Lungs:Chest clear to auscultation; no wheezes, rhonchi,rales ,or rubs present.No increased work of breathing.    Abdomen: bowel sounds normal, soft and non-tender without masses, organomegaly or hernias noted.  No guarding or rebound.   Vascular : all pulses equal ; no bruits present.  Skin:Warm & dry.  Intact without suspicious lesions or rashes ; no tenting or jaundice   Lymphatic: No lymphadenopathy is noted about the head, neck, axilla, or inguinal areas.   Neuro: Strength, tone & DTRs normal.        Assessment & Plan:  #1 comprehensive physical exam; no acute findings  Plan: see Orders  & Recommendations

## 2015-05-21 ENCOUNTER — Other Ambulatory Visit (INDEPENDENT_AMBULATORY_CARE_PROVIDER_SITE_OTHER): Payer: BLUE CROSS/BLUE SHIELD

## 2015-05-21 DIAGNOSIS — Z0189 Encounter for other specified special examinations: Secondary | ICD-10-CM | POA: Diagnosis not present

## 2015-05-21 DIAGNOSIS — Z0000 Encounter for general adult medical examination without abnormal findings: Secondary | ICD-10-CM

## 2015-05-21 LAB — CBC WITH DIFFERENTIAL/PLATELET
Basophils Absolute: 0 10*3/uL (ref 0.0–0.1)
Basophils Relative: 0.5 % (ref 0.0–3.0)
EOS ABS: 0.3 10*3/uL (ref 0.0–0.7)
Eosinophils Relative: 7.3 % — ABNORMAL HIGH (ref 0.0–5.0)
HCT: 48.2 % (ref 39.0–52.0)
Hemoglobin: 16.5 g/dL (ref 13.0–17.0)
LYMPHS PCT: 22.2 % (ref 12.0–46.0)
Lymphs Abs: 1 10*3/uL (ref 0.7–4.0)
MCHC: 34.3 g/dL (ref 30.0–36.0)
MCV: 89.8 fl (ref 78.0–100.0)
MONOS PCT: 9.9 % (ref 3.0–12.0)
Monocytes Absolute: 0.5 10*3/uL (ref 0.1–1.0)
NEUTROS ABS: 2.7 10*3/uL (ref 1.4–7.7)
NEUTROS PCT: 60.1 % (ref 43.0–77.0)
RBC: 5.37 Mil/uL (ref 4.22–5.81)
RDW: 13 % (ref 11.5–15.5)
WBC: 4.5 10*3/uL (ref 4.0–10.5)

## 2015-05-21 LAB — HEPATIC FUNCTION PANEL
ALT: 63 U/L — ABNORMAL HIGH (ref 0–53)
AST: 45 U/L — ABNORMAL HIGH (ref 0–37)
Albumin: 4.3 g/dL (ref 3.5–5.2)
Alkaline Phosphatase: 68 U/L (ref 39–117)
BILIRUBIN TOTAL: 0.7 mg/dL (ref 0.2–1.2)
Bilirubin, Direct: 0.2 mg/dL (ref 0.0–0.3)
Total Protein: 7.1 g/dL (ref 6.0–8.3)

## 2015-05-21 LAB — LIPID PANEL
CHOLESTEROL: 124 mg/dL (ref 0–200)
HDL: 34.2 mg/dL — ABNORMAL LOW (ref >39.00)
LDL CALC: 73 mg/dL (ref 0–99)
NonHDL: 89.31
TRIGLYCERIDES: 84 mg/dL (ref 0.0–149.0)
Total CHOL/HDL Ratio: 4
VLDL: 16.8 mg/dL (ref 0.0–40.0)

## 2015-05-21 LAB — BASIC METABOLIC PANEL
BUN: 18 mg/dL (ref 6–23)
CALCIUM: 9.2 mg/dL (ref 8.4–10.5)
CO2: 27 meq/L (ref 19–32)
CREATININE: 0.7 mg/dL (ref 0.40–1.50)
Chloride: 106 mEq/L (ref 96–112)
GFR: 121.44 mL/min (ref >60.00)
Glucose, Bld: 99 mg/dL (ref 70–99)
Potassium: 3.8 mEq/L (ref 3.5–5.1)
SODIUM: 141 meq/L (ref 135–145)

## 2015-05-21 LAB — TSH: TSH: 2.08 u[IU]/mL (ref 0.35–4.50)

## 2015-07-02 ENCOUNTER — Ambulatory Visit (AMBULATORY_SURGERY_CENTER): Payer: Self-pay

## 2015-07-02 VITALS — Ht 69.0 in | Wt 163.8 lb

## 2015-07-02 DIAGNOSIS — Z1211 Encounter for screening for malignant neoplasm of colon: Secondary | ICD-10-CM

## 2015-07-02 NOTE — Progress Notes (Signed)
No allergies to eggs or soy No diet/weight loss meds No past problems with anesthesia No home oxygen  Refused emmi 

## 2015-07-16 ENCOUNTER — Encounter: Payer: BLUE CROSS/BLUE SHIELD | Admitting: Gastroenterology

## 2015-09-10 ENCOUNTER — Ambulatory Visit (AMBULATORY_SURGERY_CENTER): Payer: BLUE CROSS/BLUE SHIELD | Admitting: Gastroenterology

## 2015-09-10 ENCOUNTER — Encounter: Payer: Self-pay | Admitting: Gastroenterology

## 2015-09-10 VITALS — BP 155/83 | HR 63 | Temp 96.7°F | Resp 22 | Ht 69.0 in | Wt 163.0 lb

## 2015-09-10 DIAGNOSIS — Z1211 Encounter for screening for malignant neoplasm of colon: Secondary | ICD-10-CM | POA: Diagnosis present

## 2015-09-10 MED ORDER — SODIUM CHLORIDE 0.9 % IV SOLN: 500.0000 mL | INTRAVENOUS | Status: DC

## 2015-09-10 NOTE — Op Note (Signed)
[place]  [address] [place], [id number]   COLONOSCOPY PROCEDURE REPORT  PATIENT: Timothy Bentley, Timothy Bentley  MR#: [id number] BIRTHDATE: 06-17-1951 , 64  yrs. old GENDER: male ENDOSCOPIST: Harl Bowie, MD REFERRED [id number] Linna Darner, M.D. PROCEDURE DATE:  09/10/2015 PROCEDURE:   Colonoscopy, screening First Screening Colonoscopy - Avg.  risk and is 50 yrs.  old or older - No.  Prior Negative Screening - Now for repeat screening. 10 or more years since last screening  History of Adenoma - Now for follow-up colonoscopy & has been > or = to 3 yrs.  N/A  Polyps removed today? No Recommend repeat exam, <10 yrs? No ASA CLASS:   Class II INDICATIONS:Screening for colonic neoplasia and Colorectal Neoplasm Risk Assessment for this procedure is average risk. MEDICATIONS: Propofol 200 mg IV  DESCRIPTION OF PROCEDURE:   After the risks benefits and alternatives of the procedure were thoroughly explained, informed consent was obtained.  The digital rectal exam revealed no abnormalities of the rectum.   The LB [id number]  endoscope was introduced through the anus and advanced to the cecum, which was identified by both the appendix and ileocecal valve. No adverse events experienced.   The quality of the prep was good.  The instrument was then slowly withdrawn as the colon was fully examined. Estimated blood loss is zero unless otherwise noted in this procedure report.   COLON FINDINGS: There was mild diverticulosis noted in the sigmoid colon.   The examination was otherwise normal.  Retroflexed views revealed small non bleeding internal hemorrhoids. The time to cecum = 3.4 Withdrawal time = 8.4   The scope was withdrawn and the procedure completed. COMPLICATIONS: There were no immediate complications.  ENDOSCOPIC IMPRESSION: 1.   There was mild diverticulosis noted in the sigmoid colon 2.   The examination was otherwise normal  RECOMMENDATIONS: You should continue to  follow colorectal cancer screening guidelines for "routine risk" patients with a repeat colonoscopy in 10 years.   eSigned:  Harl Bowie, MD 09/10/2015 9:11 AM

## 2015-09-10 NOTE — Patient Instructions (Signed)
YOU HAD AN ENDOSCOPIC PROCEDURE TODAY AT THE [facility]:   Refer to the procedure report that was given to you for any specific questions about what was found during the examination.  If the procedure report does not answer your questions, please call your gastroenterologist to clarify.  If you requested that your care partner not be given the details of your procedure findings, then the procedure report has been included in a sealed envelope for you to review at your convenience later.  YOU SHOULD EXPECT: Some feelings of bloating in the abdomen. Passage of more gas than usual.  Walking can help get rid of the air that was put into your GI tract during the procedure and reduce the bloating. If you had a lower endoscopy (such as a colonoscopy or flexible sigmoidoscopy) you may notice spotting of blood in your stool or on the toilet paper. If you underwent a bowel prep for your procedure, you may not have a normal bowel movement for a few days.  Please Note:  You might notice some irritation and congestion in your nose or some drainage.  This is from the oxygen used during your procedure.  There is no need for concern and it should clear up in a day or so.  SYMPTOMS TO REPORT IMMEDIATELY:   Following lower endoscopy (colonoscopy or flexible sigmoidoscopy):  Excessive amounts of blood in the stool  Significant tenderness or worsening of abdominal pains  Swelling of the abdomen that is new, acute  Fever of 100F or higher   For urgent or emergent issues, a gastroenterologist can be reached at any hour by calling [phone].   DIET: Your first meal following the procedure should be a small meal and then it is ok to progress to your normal diet. Heavy or fried foods are harder to digest and may make you feel nauseous or bloated.  Likewise, meals heavy in dairy and vegetables can increase bloating.  Drink plenty of fluids but you should avoid alcoholic beverages for 24  hours.  ACTIVITY:  You should plan to take it easy for the rest of today and you should NOT DRIVE or use heavy machinery until tomorrow (because of the sedation medicines used during the test).    FOLLOW UP: Our staff will call the number listed on your records the next business day following your procedure to check on you and address any questions or concerns that you may have regarding the information given to you following your procedure. If we do not reach you, we will leave a message.  However, if you are feeling well and you are not experiencing any problems, there is no need to return our call.  We will assume that you have returned to your regular daily activities without incident.  If any biopsies were taken you will be contacted by phone or by letter within the next 1-3 weeks.  Please call us at [phone] if you have not heard about the biopsies in 3 weeks.    SIGNATURES/CONFIDENTIALITY: You and/or your care partner have signed paperwork which will be entered into your electronic medical record.  These signatures attest to the fact that that the information above on your After Visit Summary has been reviewed and is understood.  Full responsibility of the confidentiality of this discharge information lies with you and/or your care-partner. 

## 2015-09-10 NOTE — Progress Notes (Signed)
Report to PACU, RN, vss, BBS= Clear.  

## 2015-09-11 ENCOUNTER — Telehealth: Payer: Self-pay | Admitting: Emergency Medicine

## 2015-09-11 NOTE — Telephone Encounter (Signed)
Left message, no identiifer

## 2016-05-16 ENCOUNTER — Encounter: Payer: Self-pay | Admitting: Internal Medicine

## 2016-05-16 ENCOUNTER — Ambulatory Visit (INDEPENDENT_AMBULATORY_CARE_PROVIDER_SITE_OTHER): Payer: BLUE CROSS/BLUE SHIELD | Admitting: Internal Medicine

## 2016-05-16 VITALS — BP 182/90 | HR 74 | Temp 98.4°F | Ht 69.0 in | Wt 162.5 lb

## 2016-05-16 DIAGNOSIS — Z0000 Encounter for general adult medical examination without abnormal findings: Secondary | ICD-10-CM | POA: Insufficient documentation

## 2016-05-16 DIAGNOSIS — B182 Chronic viral hepatitis C: Secondary | ICD-10-CM

## 2016-05-16 NOTE — Progress Notes (Signed)
   Subjective:    Patient ID: Timothy Bentley, male    DOB: [birth year], 65 y.o.   MRN: [id number]  HPI The patient is a 65 YO man coming in for wellness as well as to transition providers. No new concerns. Some chronic concerns.   PMH, [facility], social history reviewed and updated.  Review of Systems  Constitutional: Negative for activity change, appetite change, fatigue and fever.  HENT: Negative.   Eyes: Negative.   Respiratory: Negative for cough, chest tightness and shortness of breath.   Cardiovascular: Negative for chest pain, palpitations and leg swelling.  Gastrointestinal: Negative for abdominal distention, abdominal pain, constipation, diarrhea and nausea.  Musculoskeletal: Positive for arthralgias. Negative for back pain, gait problem and myalgias.  Neurological: Negative.   Psychiatric/Behavioral: Negative.       Objective:   Physical Exam  Constitutional: He is oriented to person, place, and time. He appears well-developed and well-nourished.  HENT:  Head: Normocephalic and atraumatic.  Eyes: EOM are Timothy.  Neck: Timothy range of motion.  Cardiovascular: Timothy rate and regular rhythm.   Pulmonary/Chest: Effort Timothy and breath sounds Timothy. No respiratory distress. He has no wheezes. He has no rales.  Abdominal: Soft. Bowel sounds are Timothy. He exhibits no distension. There is no tenderness. There is no rebound.  Musculoskeletal: He exhibits no edema.  Neurological: He is alert and oriented to person, place, and time. Coordination Timothy.  Skin: Skin is warm and dry.  Psychiatric: He has a Timothy mood and affect.   Vitals:   05/16/16 1439 05/16/16 1509  BP: (!) 162/90 (!) 182/90  Pulse: 74   Temp: 98.4 F (36.9 C)   TempSrc: Oral   SpO2: 98%   Weight: 162 lb 8 oz (73.7 kg)   Height: 5\' 9"  (1.753 m)       Assessment & Plan:

## 2016-05-16 NOTE — Assessment & Plan Note (Signed)
Counseled him on the new option for his hep c treatment and that he should get evaluation for fibrosis. Given the information on the [facility].

## 2016-05-16 NOTE — Patient Instructions (Addendum)
Think about getting the hepatitis C treated as the treatment is very good now and has minimal if any side effects.   The [facility] in town (right across from [place], [phone]) does this treatment and there is also a liver clinic in town that does this treatment.   Think about decreasing the amount of salt in the diet to help with the blood pressure and start keeping track of it. The goal is <140 and <90.   Health Maintenance, Male A healthy lifestyle and preventative care can promote health and wellness.  Maintain regular health, dental, and eye exams.  Eat a healthy diet. Foods like vegetables, fruits, whole grains, low-fat dairy products, and lean protein foods contain the nutrients you need and are low in calories. Decrease your intake of foods high in solid fats, added sugars, and salt. Get information about a proper diet from your health care provider, if necessary.  Regular physical exercise is one of the most important things you can do for your health. Most adults should get at least 150 minutes of moderate-intensity exercise (any activity that increases your heart rate and causes you to sweat) each week. In addition, most adults need muscle-strengthening exercises on 2 or more days a week.   Maintain a healthy weight. The body mass index (BMI) is a screening tool to identify possible weight problems. It provides an estimate of body fat based on height and weight. Your health care provider can find your BMI and can help you achieve or maintain a healthy weight. For males 20 years and older:  A BMI below 18.5 is considered underweight.  A BMI of 18.5 to 24.9 is normal.  A BMI of 25 to 29.9 is considered overweight.  A BMI of 30 and above is considered obese.  Maintain normal blood lipids and cholesterol by exercising and minimizing your intake of saturated fat. Eat a balanced diet with plenty of fruits and vegetables. Blood tests for lipids and  cholesterol should begin at age 35 and be repeated every 5 years. If your lipid or cholesterol levels are high, you are over age 69, or you are at high risk for heart disease, you may need your cholesterol levels checked more frequently.Ongoing high lipid and cholesterol levels should be treated with medicines if diet and exercise are not working.  If you smoke, find out from your health care provider how to quit. If you do not use tobacco, do not start.  Lung cancer screening is recommended for adults aged 1-80 years who are at high risk for developing lung cancer because of a history of smoking. A yearly low-dose CT scan of the lungs is recommended for people who have at least a 30-pack-year history of smoking and are current smokers or have quit within the past 15 years. A pack year of smoking is smoking an average of 1 pack of cigarettes a day for 1 year (for example, a 30-pack-year history of smoking could mean smoking 1 pack a day for 30 years or 2 packs a day for 15 years). Yearly screening should continue until the smoker has stopped smoking for at least 15 years. Yearly screening should be stopped for people who develop a health problem that would prevent them from having lung cancer treatment.  If you choose to drink alcohol, do not have more than 2 drinks per day. One drink is considered to be 12 oz (360 mL) of beer, 5 oz (150 mL) of wine, or 1.5  oz (45 mL) of liquor.  Avoid the use of street drugs. Do not share needles with anyone. Ask for help if you need support or instructions about stopping the use of drugs.  High blood pressure causes heart disease and increases the risk of stroke. High blood pressure is more likely to develop in:  People who have blood pressure in the end of the normal range (100-139/85-89 mm Hg).  People who are overweight or obese.  People who are African American.  If you are 43-22 years of age, have your blood pressure checked every 3-5 years. If you are 31  years of age or older, have your blood pressure checked every year. You should have your blood pressure measured twice--once when you are at a hospital or clinic, and once when you are not at a hospital or clinic. Record the average of the two measurements. To check your blood pressure when you are not at a hospital or clinic, you can use:  An automated blood pressure machine at a pharmacy.  A home blood pressure monitor.  If you are 62-65 years old, ask your health care provider if you should take aspirin to prevent heart disease.  Diabetes screening involves taking a blood sample to check your fasting blood sugar level. This should be done once every 3 years after age 9 if you are at a normal weight and without risk factors for diabetes. Testing should be considered at a younger age or be carried out more frequently if you are overweight and have at least 1 risk factor for diabetes.  Colorectal cancer can be detected and often prevented. Most routine colorectal cancer screening begins at the age of 56 and continues through age 91. However, your health care provider may recommend screening at an earlier age if you have risk factors for colon cancer. On a yearly basis, your health care provider may provide home test kits to check for hidden blood in the stool. A small camera at the end of a tube may be used to directly examine the colon (sigmoidoscopy or colonoscopy) to detect the earliest forms of colorectal cancer. Talk to your health care provider about this at age 68 when routine screening begins. A direct exam of the colon should be repeated every 5-10 years through age 22, unless early forms of precancerous polyps or small growths are found.  People who are at an increased risk for hepatitis B should be screened for this virus. You are considered at high risk for hepatitis B if:  You were born in a country where hepatitis B occurs often. Talk with your health care provider about which countries  are considered high risk.  Your parents were born in a high-risk country and you have not received a shot to protect against hepatitis B (hepatitis B vaccine).  You have HIV or AIDS.  You use needles to inject street drugs.  You live with, or have sex with, someone who has hepatitis B.  You are a man who has sex with other men (MSM).  You get hemodialysis treatment.  You take certain medicines for conditions like cancer, organ transplantation, and autoimmune conditions.  Hepatitis C blood testing is recommended for all people born from [birth year] through 1963 and any individual with known risk factors for hepatitis C.  Healthy men should no longer receive prostate-specific antigen (PSA) blood tests as part of routine cancer screening. Talk to your health care provider about prostate cancer screening.  Testicular cancer screening is not recommended  for adolescents or adult males who have no symptoms. Screening includes self-exam, a health care provider exam, and other screening tests. Consult with your health care provider about any symptoms you have or any concerns you have about testicular cancer.  Practice safe sex. Use condoms and avoid high-risk sexual practices to reduce the spread of sexually transmitted infections (STIs).  You should be screened for STIs, including gonorrhea and chlamydia if:  You are sexually active and are younger than 24 years.  You are older than 24 years, and your health care provider tells you that you are at risk for this type of infection.  Your sexual activity has changed since you were last screened, and you are at an increased risk for chlamydia or gonorrhea. Ask your health care provider if you are at risk.  If you are at risk of being infected with HIV, it is recommended that you take a prescription medicine daily to prevent HIV infection. This is called pre-exposure prophylaxis (PrEP). You are considered at risk if:  You are a man who has sex with  other men (MSM).  You are a heterosexual man who is sexually active with multiple partners.  You take drugs by injection.  You are sexually active with a partner who has HIV.  Talk with your health care provider about whether you are at high risk of being infected with HIV. If you choose to begin PrEP, you should first be tested for HIV. You should then be tested every 3 months for as long as you are taking PrEP.  Use sunscreen. Apply sunscreen liberally and repeatedly throughout the day. You should seek shade when your shadow is shorter than you. Protect yourself by wearing long sleeves, pants, a wide-brimmed hat, and sunglasses year round whenever you are outdoors.  Tell your health care provider of new moles or changes in moles, especially if there is a change in shape or color. Also, tell your health care provider if a mole is larger than the size of a pencil eraser.  A one-time screening for abdominal aortic aneurysm (AAA) and surgical repair of large AAAs by ultrasound is recommended for men aged 31-75 years who are current or former smokers.  Stay current with your vaccines (immunizations).   This information is not intended to replace advice given to you by your health care provider. Make sure you discuss any questions you have with your health care provider.   Document Released: 03/20/2008 Document Revised: 10/13/2014 Document Reviewed: 02/17/2011 Elsevier Interactive Patient Education [employer].

## 2016-05-16 NOTE — Progress Notes (Signed)
Pre visit review using our clinic review tool, if applicable. No additional management support is needed unless otherwise documented below in the visit note. 

## 2016-05-16 NOTE — Assessment & Plan Note (Signed)
Checking labs today, colonoscopy up to date. Counseled on the shingles vaccine. BP is mildly elevated today and he will monitor at home for elevation and cut back on sodium. Given screening recommendations.

## 2016-06-20 ENCOUNTER — Other Ambulatory Visit (INDEPENDENT_AMBULATORY_CARE_PROVIDER_SITE_OTHER): Payer: BLUE CROSS/BLUE SHIELD

## 2016-06-20 DIAGNOSIS — Z0000 Encounter for general adult medical examination without abnormal findings: Secondary | ICD-10-CM | POA: Diagnosis not present

## 2016-06-20 LAB — LIPID PANEL
CHOL/HDL RATIO: 3
Cholesterol: 113 mg/dL (ref 0–200)
HDL: 35 mg/dL — AB (ref >39.00)
LDL CALC: 69 mg/dL (ref 0–99)
NONHDL: 77.65
Triglycerides: 45 mg/dL (ref 0.0–149.0)
VLDL: 9 mg/dL (ref 0.0–40.0)

## 2016-06-20 LAB — COMPREHENSIVE METABOLIC PANEL
ALK PHOS: 67 U/L (ref 39–117)
ALT: 73 U/L — AB (ref 0–53)
AST: 58 U/L — ABNORMAL HIGH (ref 0–37)
Albumin: 4.3 g/dL (ref 3.5–5.2)
BILIRUBIN TOTAL: 1.1 mg/dL (ref 0.2–1.2)
BUN: 19 mg/dL (ref 6–23)
CO2: 31 mEq/L (ref 19–32)
Calcium: 9.2 mg/dL (ref 8.4–10.5)
Chloride: 103 mEq/L (ref 96–112)
Creatinine, Ser: 0.74 mg/dL (ref 0.40–1.50)
GFR: 113.49 mL/min (ref >60.00)
GLUCOSE: 83 mg/dL (ref 70–99)
Potassium: 4.9 mEq/L (ref 3.5–5.1)
Sodium: 140 mEq/L (ref 135–145)
TOTAL PROTEIN: 7.2 g/dL (ref 6.0–8.3)

## 2016-06-20 LAB — CBC
HEMATOCRIT: 46.7 % (ref 39.0–52.0)
Hemoglobin: 16.4 g/dL (ref 13.0–17.0)
MCHC: 35.2 g/dL (ref 30.0–36.0)
MCV: 87.9 fl (ref 78.0–100.0)
PLATELETS: 125 10*3/uL — AB (ref 150.0–400.0)
RBC: 5.31 Mil/uL (ref 4.22–5.81)
RDW: 12.8 % (ref 11.5–15.5)
WBC: 3.6 10*3/uL — ABNORMAL LOW (ref 4.0–10.5)

## 2016-06-20 LAB — TSH: TSH: 1.07 u[IU]/mL (ref 0.35–4.50)

## 2016-08-01 ENCOUNTER — Ambulatory Visit: Payer: Self-pay | Admitting: Orthopedic Surgery

## 2016-09-11 ENCOUNTER — Ambulatory Visit (INDEPENDENT_AMBULATORY_CARE_PROVIDER_SITE_OTHER): Payer: BLUE CROSS/BLUE SHIELD | Admitting: Family

## 2016-09-11 ENCOUNTER — Encounter: Payer: Self-pay | Admitting: Family

## 2016-09-11 ENCOUNTER — Other Ambulatory Visit: Payer: BLUE CROSS/BLUE SHIELD

## 2016-09-11 VITALS — BP 138/80 | HR 80 | Temp 99.3°F | Resp 16 | Ht 69.0 in | Wt 160.0 lb

## 2016-09-11 DIAGNOSIS — R319 Hematuria, unspecified: Secondary | ICD-10-CM | POA: Insufficient documentation

## 2016-09-11 DIAGNOSIS — R3129 Other microscopic hematuria: Secondary | ICD-10-CM | POA: Diagnosis not present

## 2016-09-11 DIAGNOSIS — R8290 Unspecified abnormal findings in urine: Secondary | ICD-10-CM | POA: Insufficient documentation

## 2016-09-11 LAB — POCT URINALYSIS DIPSTICK
BILIRUBIN UA: NEGATIVE
GLUCOSE UA: NEGATIVE
Leukocytes, UA: NEGATIVE
Nitrite, UA: NEGATIVE
Urobilinogen, UA: NEGATIVE
pH, UA: 6

## 2016-09-11 MED ORDER — ACETAMINOPHEN-CODEINE #3 300-30 MG PO TABS: 1.0000 | ORAL_TABLET | ORAL | 0 refills | Status: DC | PRN

## 2016-09-11 MED ORDER — TAMSULOSIN HCL 0.4 MG PO CAPS
0.4000 mg | ORAL_CAPSULE | Freq: Every day | ORAL | 0 refills | Status: DC
Start: 2016-09-11 — End: 2017-07-22

## 2016-09-11 NOTE — Patient Instructions (Addendum)
Thank you for choosing [facility].  SUMMARY AND INSTRUCTIONS:  Medication:  Tamsulosin as needed.  Your prescription(s) have been submitted to your pharmacy or been printed and provided for you. Please take as directed and contact our office if you believe you are having problem(s) with the medication(s) or have any questions.  Imaging / Radiology:  Please stop by radiology on the basement level of the building for your x-rays. Your results will be released to [place] (or called to you) after review, usually within 72 hours after test completion. If any treatments or changes are necessary, you will be notified at that same time.  Referrals:  Referrals have been made during this visit. You should expect to hear back from our schedulers in about 7-10 days in regards to establishing an appointment with the specialists we discussed.   Follow up:  If your symptoms worsen or fail to improve, please contact our office for further instruction, or in case of emergency go directly to the emergency room at the closest medical facility.     Kidney Stones Kidney stones (urolithiasis) are solid, rock-like deposits that form inside of the organs that make urine (kidneys). A kidney stone may form in a kidney and move into the bladder, where it can cause intense pain and block the flow of urine. Kidney stones are created when high levels of certain minerals are found in the urine. They are usually passed through urination, but in some cases, medical treatment may be needed to remove them. What are the causes? Kidney stones may be caused by:  A condition in which certain glands produce too much parathyroid hormone (primary hyperparathyroidism), which causes too much calcium buildup in the blood.  Buildup of uric acid crystals in the bladder (hyperuricosuria). Uric acid is a chemical that the body produces when you eat certain foods. It usually exits the body in the urine.  Narrowing  (stricture) of one or both of the tubes that drain urine from the kidneys to the bladder (ureters).  A kidney blockage that is present at birth (congenital obstruction).  Past surgery on the kidney or the ureters, such as gastric bypass surgery. What increases the risk? The following factors make you more likely to develop kidney stones:  Having had a kidney stone in the past.  Having a family history of kidney stones.  Not drinking enough water.  Eating a diet that is high in protein, salt (sodium), or sugar.  Being overweight or obese. What are the signs or symptoms? Symptoms of a kidney stone may include:  Nausea.  Vomiting.  Blood in the urine (hematuria).  Pain in the side of the abdomen, right below the ribs (flank pain). Pain usually spreads (radiates) to the groin.  Needing to urinate frequently or urgently. How is this diagnosed? This condition may be diagnosed based on:  Your medical history.  A physical exam.  Blood tests.  Urine tests.  CT scan.  Abdominal X-ray.  A procedure to examine the inside of the bladder (cystoscopy). How is this treated? Treatment for kidney stones depends on the size, location, and makeup of the stones. Treatment may involve:  Analyzing your urine before and after you pass the stone through urination.  Being monitored at the hospital until you pass the stone through urination.  Increasing your fluid intake and decreasing the amount of calcium and protein in your diet.  A procedure to break up kidney stones in the bladder using:  A focused beam of light (laser  therapy).  Shock waves (extracorporeal shock wave lithotripsy).  Surgery to remove kidney stones. This may be needed if you have severe pain or have stones that block your urinary tract. Follow these instructions at home: Eating and drinking  Drink enough fluid to keep your urine clear or pale yellow. This will help you to pass the kidney stone.  If  directed, change your diet. This may include:  Limiting how much sodium you eat.  Eating more fruits and vegetables.  Limiting how much meat, poultry, fish, and eggs you eat.  Follow instructions from your health care provider about eating or drinking restrictions. General instructions  Collect urine samples as told by your health care provider. You may need to collect a urine sample:  24 hours after you pass the stone.  8-12 weeks after passing the kidney stone, and every 6-12 months after that.  Strain your urine every time you urinate, for as long as directed. Use the strainer that your health care provider recommends.  Do not throw out the kidney stone after passing it. Keep the stone so it can be tested by your health care provider. Testing the makeup of your kidney stone may help prevent you from getting kidney stones in the future.  Take over-the-counter and prescription medicines only as told by your health care provider.  Keep all follow-up visits as told by your health care provider. This is important. You may need follow-up X-rays or ultrasounds to make sure that your stone has passed. How is this prevented? To prevent another kidney stone:  Drink enough fluid to keep your urine clear or pale yellow. This is the best way to prevent kidney stones.  Eat a healthy diet and follow recommendations from your health care provider about foods to avoid. You may be instructed to eat a low-protein diet. Recommendations vary depending on the type of kidney stone that you have.  Maintain a healthy weight. Contact a health care provider if:  You have pain that gets worse or does not get better with medicine. Get help right away if:  You have a fever or chills.  You develop severe pain.  You develop new abdominal pain.  You faint.  You are unable to urinate. This information is not intended to replace advice given to you by your health care provider. Make sure you discuss  any questions you have with your health care provider. Document Released: 09/22/2005 Document Revised: 04/11/2016 Document Reviewed: 03/07/2016 Elsevier Interactive Patient Education  2017 [employer].

## 2016-09-11 NOTE — Assessment & Plan Note (Signed)
In office urinalysis negative for leukocytes and nitrites and positive for hematuria. No gross hematuria. Symptoms and exam concerning for kidney stone. Obtain CT scan. Symptoms may be possibly related to constipation. Start Flomax. Start Tylenol #3 as needed for pain. Follow up pending CT scan results or if symptoms worsen.

## 2016-09-11 NOTE — Progress Notes (Signed)
Subjective:    Patient ID: Erique Christison, male    DOB: [birth year], 65 y.o.   MRN: [id number]  Chief Complaint  Patient presents with  . Back Pain    woke up in the  middle of that night with a sharp pain in his lower left side of back, same thing happened a couple of weeks ago    HPI:  Boykin Cosper is a 65 y.o. male who  has a past medical history of Anal condyloma; Arthritis; Diverticulosis; Hepatitis C; Nephrolithiasis (2002); and Thrombocytopenia ([place]) (2010). and presents today for an acute ofice visit.   This is a new problem. Associated symptom of pain located in his left flank and radiating to his lower left quadrant has been going on for about 12 hours with pain described as sharp that began to taper off around 7am this morning. Continues to experience some mild discomfort in his left flank and lower left abdomen. Denies urinary frequency, urgency or fevers. Has had some difficulty with urination since symptoms started. Previous history of diverticulitis and kidney stones. There are no modifying factors or attempted treatments. Noted some constipation with no nausea or vomiting.    No Known Allergies    Outpatient Medications Prior to Visit  Medication Sig Dispense Refill  . Nutritional Supplements (SOY PROTEIN SHAKE PO) Take by mouth daily.     No facility-administered medications prior to visit.       Past Surgical History:  Procedure Laterality Date  . COLONOSCOPY  2004   Tics  . excision anal condyloma  02/05/12   Dr Zella Richer  . FLEXIBLE SIGMOIDOSCOPY  2012   Dr Collene Mares for evaluation of venereal warts  . venereal wart resection  2012   Dr Zella Richer  . WISDOM TOOTH EXTRACTION        Past Medical History:  Diagnosis Date  . Anal condyloma   . Arthritis    both hips  . Diverticulosis    Dr Collene Mares  . Hepatitis C    Genotype 23 in 2000; seen @ [facility], treatment declined  . Nephrolithiasis 2002   X 1  . Thrombocytopenia ([place]) 2010   Platelet count  99,000      Review of Systems  Constitutional: Negative for chills and fever.  Genitourinary: Positive for flank pain. Negative for discharge, dysuria, frequency, scrotal swelling, testicular pain and urgency.      Objective:    BP 138/80 (BP Location: Left Arm, Patient Position: Sitting, Cuff Size: Normal)   Pulse 80   Temp 99.3 F (37.4 C) (Oral)   Resp 16   Ht 5\' 9"  (1.753 m)   Wt 160 lb (72.6 kg)   SpO2 97%   BMI 23.63 kg/m  Nursing note and vital signs reviewed.  Physical Exam  Constitutional: He is oriented to person, place, and time. He appears well-developed and well-nourished. No distress.  Cardiovascular: Normal rate, regular rhythm, normal heart sounds and intact distal pulses.   Pulmonary/Chest: Effort normal and breath sounds normal.  Abdominal: Soft. Normal appearance and bowel sounds are normal. He exhibits no mass. There is no hepatosplenomegaly. There is tenderness in the left lower quadrant. There is no rigidity, no rebound, no guarding, no CVA tenderness, no tenderness at McBurney's point and negative Murphy's sign.  Neurological: He is alert and oriented to person, place, and time.  Skin: Skin is warm and dry.  Psychiatric: He has a normal mood and affect. His behavior is normal. Judgment and thought content normal.  Assessment & Plan:   Problem List Items Addressed This Visit      Other   Hematuria - Primary    In office urinalysis negative for leukocytes and nitrites and positive for hematuria. No gross hematuria. Symptoms and exam concerning for kidney stone. Obtain CT scan. Symptoms may be possibly related to constipation. Start Flomax. Start Tylenol #3 as needed for pain. Follow up pending CT scan results or if symptoms worsen.       Relevant Medications   tamsulosin (FLOMAX) 0.4 MG CAPS capsule   acetaminophen-codeine (TYLENOL #3) 300-30 MG tablet   Other Relevant Orders   POCT urinalysis dipstick (Completed)   CT RENAL STONE STUDY    Urine culture       I am having Mr. Depasquale start on tamsulosin and acetaminophen-codeine. I am also having him maintain his Nutritional Supplements (SOY PROTEIN SHAKE PO).   Meds ordered this encounter  Medications  . tamsulosin (FLOMAX) 0.4 MG CAPS capsule    Sig: Take 1 capsule (0.4 mg total) by mouth daily.    Dispense:  30 capsule    Refill:  0    Order Specific Question:   Supervising Provider    Answer:   Pricilla Holm A [id number]  . acetaminophen-codeine (TYLENOL #3) 300-30 MG tablet    Sig: Take 1-2 tablets by mouth every 4 (four) hours as needed for moderate pain.    Dispense:  30 tablet    Refill:  0    Order Specific Question:   Supervising Provider    Answer:   Pricilla Holm A [id number]     Follow-up: Return if symptoms worsen or fail to improve.  Mauricio Po, FNP

## 2016-09-12 ENCOUNTER — Encounter: Payer: Self-pay | Admitting: Family

## 2016-09-12 ENCOUNTER — Other Ambulatory Visit: Payer: Self-pay | Admitting: Family

## 2016-09-12 ENCOUNTER — Ambulatory Visit (INDEPENDENT_AMBULATORY_CARE_PROVIDER_SITE_OTHER)
Admission: RE | Admit: 2016-09-12 | Discharge: 2016-09-12 | Disposition: A | Discharge status: Home / Self Care | Payer: BLUE CROSS/BLUE SHIELD | Source: Ambulatory Visit | Attending: Family | Admitting: Family

## 2016-09-12 DIAGNOSIS — R3129 Other microscopic hematuria: Secondary | ICD-10-CM

## 2016-09-12 DIAGNOSIS — N200 Calculus of kidney: Secondary | ICD-10-CM

## 2016-09-12 NOTE — Progress Notes (Unsigned)
reer t

## 2016-09-13 LAB — URINE CULTURE: ORGANISM ID, BACTERIA: NO GROWTH

## 2016-09-16 ENCOUNTER — Other Ambulatory Visit: Payer: Self-pay | Admitting: Urology

## 2016-09-17 ENCOUNTER — Ambulatory Visit (INDEPENDENT_AMBULATORY_CARE_PROVIDER_SITE_OTHER): Payer: BLUE CROSS/BLUE SHIELD | Admitting: Internal Medicine

## 2016-09-17 ENCOUNTER — Encounter: Payer: Self-pay | Admitting: Internal Medicine

## 2016-09-17 VITALS — BP 170/81 | HR 72 | Temp 98.2°F | Ht 69.0 in | Wt 159.0 lb

## 2016-09-17 DIAGNOSIS — B182 Chronic viral hepatitis C: Secondary | ICD-10-CM | POA: Diagnosis not present

## 2016-09-17 NOTE — Patient Instructions (Signed)
Date 09/17/16  Dear Timothy Bentley, As discussed in the [facility], your hepatitis C therapy will include the following medications:    sofosbuvir 400 mg/velpatasvir 100 mg (Epclusa) oral daily  Please note that ALL MEDICATIONS WILL START ON THE SAME DATE for a total of 12 weeks. ---------------------------------------------------------------- Your HCV Treatment Start Date: TBA   Your HCV genotype: 2    Liver Fibrosis: TBD    ---------------------------------------------------------------- YOUR PHARMACY CONTACT (depending on your insurance):   [facility] [place] [facility] [place] Phone: [phone] Hours: Monday to Friday 7:30 am to 6:00 pm   Please always contact your pharmacy at least 3-4 business days before you run out of medications to ensure your next month's medication is ready or 1 week prior to running out if you receive it by mail.  Remember, each prescription is for 28 days. ---------------------------------------------------------------- GENERAL NOTES REGARDING YOUR HEPATITIS C MEDICATION:  SOFOSBUVIR (SOVALDI or EPCLUSA): - Sofosbuvir 400 mg tablet is taken daily with OR without food. - The sofosbuvir tablets are yellow. - The Epclusa tablets are pink, diamond-shaped - The tablets should be stored at room temperature. - The most common side effects with sofosbuvir or Epclusa include:      1. Fatigue      2. Headache      3. Nausea      4. Diarrhea      5. Insomnia  - Acid reducing agents such as H2 blockers (ie. Pepcid (famotidine), Zantac (ranitidine), Tagamet (cimetidine), Axid (nizatidine) and proton pump inhibitors (ie. Prilosec (omeprazole), Protonix (pantoprazole), Nexium (esomeprazole), or Aciphex (rabeprazole)) can decrease effectiveness of Harvoni. Do not take until you have discussed with a health care provider.    -Antacids that contain magnesium and/or aluminum hydroxide (ie. Milk of Magensia,  Rolaids, Gaviscon, Maalox, Mylanta, an dArthritis Pain Formula)can reduce absorption of sofosbuvir, so take them at least 4 hours before or after Harvoni.  -Calcium carbonate (calcium supplements or antacids such as Tums, Caltrate, Os-Cal)needs to be taken at least 4 hours hours before or after sofosbuvir.  -St. John's wort or any products that contain St. John's wort like some herbal supplements  Please inform the office prior to starting any of these medications.   Please note that this only lists the most common side effects and is NOT a comprehensive list of the potential side effects of these medications. For more information, please review the drug information sheets that come with your medication package from the pharmacy.  ---------------------------------------------------------------- GENERAL HELPFUL HINTS ON HCV THERAPY: 1. No alcohol. 2. Stay well-hydrated 3. Notify the [facility] of any changes in your other over-the-counter/herbal or prescription medications. 4. If you miss a dose of your medication, take the missed dose as soon as you remember. Return to your regular time/dose schedule the next day.  5.  Do not stop taking your medications without first talking with your healthcare provider. 6.  You may take Tylenol (acetaminophen), as long as the dose is less than 2000 mg (OR no more than 4 tablets of the Tylenol Extra Strengths 500mg  tablet) in 24 hours. 7. You will follow up with our clinic pharmacist initially after starting the medication to monitor for any possible side effects 8. You will get labs once during treatment, soon after treatment completion and again 6 months or more after treatment completion to verify the virus is completely gone.   Scharlene Gloss, [place] [facility] [place], [place]  [id number] [phone]

## 2016-09-17 NOTE — Progress Notes (Signed)
[facility] [facility]   CC: consideration for treatment for chronic hepatitis C  HPI:  +Timothy Bentley is a 65 y.o. male who presents for initial evaluation and management of chronic hepatitis C.  Patient tested positive after blood donation in the 1980s (presumably nonAB). Hepatitis C-associated risk factors present are: IV drug abuse (details: in the 1980s was last use). Patient denies history of blood transfusion, history of clotting factor transfusion, multiple sexual partners, renal dialysis, sexual contact with person with liver disease, tattoos. Patient has had other studies performed. Results: hepatitis C RNA by PCR, result: positive. Patient has not had prior treatment for Hepatitis C. Patient does not have a past history of liver disease. Patient does not have a family history of liver disease. Patient does not  have associated signs or symptoms related to liver disease.  Labs reviewed and confirm chronic hepatitis C with a positive viral load.   Records reviewed from previous blood tests and has genotype 2, low platelets.      Patient does not have documented immunity to Hepatitis A. Patient does have documented immunity to Hepatitis B.    Review of Systems:   Constitutional: negative for fatigue and malaise Gastrointestinal: negative for diarrhea Hematologic/lymphatic: negative for lymphadenopathy Musculoskeletal: negative for myalgias and arthralgias All other systems reviewed and are negative       Past Medical History:  Diagnosis Date  . Anal condyloma   . Arthritis    both hips  . Diverticulosis    Dr Collene Mares  . Hepatitis C    Genotype 23 in 2000; seen @ [facility], treatment declined  . Nephrolithiasis 2002   X 1  . Thrombocytopenia ([place]) 2010   Platelet count 99,000    Prior to Admission medications   Medication Sig Start Date End Date Taking? Authorizing Provider  acetaminophen-codeine (TYLENOL #3) 300-30 MG tablet Take 1-2 tablets by mouth  every 4 (four) hours as needed for moderate pain. 09/11/16  Yes Golden Circle, FNP  Nutritional Supplements (SOY PROTEIN SHAKE PO) Take by mouth daily.   Yes Historical Provider, MD  tamsulosin (FLOMAX) 0.4 MG CAPS capsule Take 1 capsule (0.4 mg total) by mouth daily. 09/11/16  Yes Golden Circle, FNP    No Known Allergies  Social History  Substance Use Topics  . Smoking status: Never Smoker  . Smokeless tobacco: Never Used  . Alcohol use No    Family History  Problem Relation Age of Onset  . Meniere's disease Father   . Alcohol abuse Father   . Coronary artery disease Father     S/P CABG , > 55  . Lung cancer Maternal Uncle     smoker  . Diabetes Neg Hx   . Stroke Neg Hx   . Colon cancer Neg Hx      Objective:  Constitutional: in no apparent distress and alert,  Vitals:   09/17/16 0838  BP: (!) 170/81  Pulse: 72  Temp: 98.2 F (36.8 C)   Eyes: anicteric Cardiovascular: Cor RRR Respiratory: CTA B; normal respiratory effort Gastrointestinal: Bowel sounds are normal, liver is not enlarged, spleen is not enlarged Musculoskeletal: no pedal edema noted Skin: negatives: no rash; no porphyria cutanea tarda Lymphatic: no cervical lymphadenopathy   Laboratory Genotype:  Lab Results  Component Value Date   HCVGENOTYPE 2 02/14/2015   HCV viral load:  Lab Results  Component Value Date   HCVQUANT [id number] (H) 02/14/2015   Lab Results  Component Value  Date   WBC 3.6 (L) 06/20/2016   HGB 16.4 06/20/2016   HCT 46.7 06/20/2016   MCV 87.9 06/20/2016   PLT 125.0 (L) 06/20/2016    Lab Results  Component Value Date   CREATININE 0.74 06/20/2016   BUN 19 06/20/2016   NA 140 06/20/2016   K 4.9 06/20/2016   CL 103 06/20/2016   CO2 31 06/20/2016    Lab Results  Component Value Date   ALT 73 (H) 06/20/2016   AST 58 (H) 06/20/2016   ALKPHOS 67 06/20/2016     Labs and history reviewed and show CHILD-PUGH A  5-6 points: Child class A 7-9 points: Child class  B 10-15 points: Child class C  Lab Results  Component Value Date   INR 1.1 09/17/2007   BILITOT 1.1 06/20/2016   ALBUMIN 4.3 06/20/2016     Assessment: New Patient with Chronic Hepatitis C genotype 2, untreated.  I discussed with the patient the lab findings that confirm chronic hepatitis C as well as the natural history and progression of disease including about 30% of people who develop cirrhosis of the liver if left untreated and once cirrhosis is established there is a 2-7% risk per year of liver cancer and liver failure.  I discussed the importance of treatment and benefits in reducing the risk, even if significant liver fibrosis exists.   Plan: 1) Patient counseled extensively on limiting acetaminophen to no more than 2 grams daily, avoidance of alcohol. 2) Transmission discussed with patient including sexual transmission, sharing razors and toothbrush.   3) Will need referral to gastroenterology if concern for cirrhosis 4) Will need referral for substance abuse counseling: No.; Further work up to include urine drug screen  No. 5) Will prescribe Epclusa for 12 weeks 6) Hepatitis A and B titers 8) Pneumovax vaccine next visit 9) Further work up to include liver staging with elastography 10) will follow up after deciding on treatment.  He still not sure if he is ready or wants treatment at this time and wants to think about it and will come back in the Spring after hip replacement.  Will do labs at that time.

## 2016-09-18 ENCOUNTER — Other Ambulatory Visit: Payer: Self-pay | Admitting: Pharmacist Clinician (PhC)/ Clinical Pharmacy Specialist

## 2016-09-18 ENCOUNTER — Encounter: Payer: Self-pay | Admitting: *Deleted

## 2016-09-18 ENCOUNTER — Encounter (HOSPITAL_COMMUNITY): Payer: Self-pay | Admitting: *Deleted

## 2016-09-18 MED ORDER — SOFOSBUVIR-VELPATASVIR 400-100 MG PO TABS: 1.0000 | ORAL_TABLET | Freq: Every day | ORAL | 2 refills | Status: DC

## 2016-09-19 ENCOUNTER — Ambulatory Visit: Payer: Self-pay | Admitting: Orthopedic Surgery

## 2016-09-19 NOTE — H&P (Signed)
Timothy Bentley DOB: 03-Jun-1951 Single / Language: Timothy Bentley / Race: White Male Date of Admission:  10/15/2016 CC:  10/15/2016 History of Present Illness  The patient is a 65 year old male who comes in  for a preoperative History and Physical. The patient is scheduled for a left total hip arthroplasty (anterior) to be performed by Dr. Dione Bentley. Aluisio, MD at [facility] on 10/15/2016. The patient is a 65 year old male who presented for follow up of their hip. The patient is being followed for their bilateral osteoarthritis. Symptoms reported include: pain, stiffness and difficulty ambulating. The patient feels that they are doing well and report their pain level to be moderate. He states the pain changes with activity. He has more loss of motion especially on the rt. There is some pain in the lt. hip as well. Timothy Bentley is being followed for bilateral hip osteoarthritis. He has stiffness, intermittent pain, worse with weightbearing, but still tolerable and he is able to get along with it at this point. He is just in for his recheck as recommended by Dr. Wynelle Link. His pain is in the anterior groin of both hips, worse on the right than the left. The patient feels that they are doing poorly and report their pain level to be moderate. Current treatment includes: home exercise program (stretching). The following medication has been used for pain control: none. AP and lateral views of the hip show end stage osteoarthritis in both hips with cycstic changes noted in the femoral heads and acetabulum. Large osteophyte formation mostly inferior about the joint. No fractures noted but starting to see some collapse of the femoral heads, left greater than right. He has reached a point where he would like to get the hip fixed. He wants to proceed with the left hip at this time. They have been treated conservatively in the past for the above stated problem and despite conservative measures, they continue to have progressive  pain and severe functional limitations and dysfunction. They have failed non-operative management including home exercise, medications. It is felt that they would benefit from undergoing total joint replacement. Risks and benefits of the procedure have been discussed with the patient and they elect to proceed with surgery. There are no active contraindications to surgery such as ongoing infection or rapidly progressive neurological disease.  Problem List/Past Medical  Primary osteoarthritis of both hips (M16.0)  Osteoarthritis of right hip, unspecified osteoarthritis type (M16.11)  Heart murmur  Chondromalacia, patella (M22.40) [08/15/2002]: Tear, lateral meniscus, knee, current (836.1) [08/15/2002]: Pain in joint, shoulder region (M25.519) [02/02/2009]: Disorder, shoulder region NEC (726.2) [02/02/2009]: Diverticulosis  Kidney Stone  Allergies No Known Drug Allergies   Family History Heart Disease  Father. First Degree Relatives   Social History Not under pain contract  No history of drug/alcohol rehab  Number of flights of stairs before winded  greater than 5 Tobacco use  Never smoker. 06/09/2014 Tobacco / smoke exposure  06/09/2014: no Marital status  single Current work status  working full time Children  0 Exercise  Exercises daily; does individual sport, other and gym / [employer] Living situation  live alone Former drinker  06/09/2014: In the past drank beer, wine and hard liquor  Medication History  No Current Medications  Past Surgical History  No pertinent past surgical history     Review of Systems General Not Present- Chills, Fatigue, Fever, Memory Loss, Night Sweats, Weight Gain and Weight Loss. Skin Not Present- Eczema, Hives, Itching, Lesions and Rash. HEENT Not  Present- Dentures, Double Vision, Headache, Hearing Loss, Tinnitus and Visual Loss. Respiratory Not Present- Allergies, Chronic Cough, Coughing up blood, Shortness of breath at  rest and Shortness of breath with exertion. Cardiovascular Not Present- Chest Pain, Difficulty Breathing Lying Down, Murmur, Palpitations, Racing/skipping heartbeats and Swelling. Gastrointestinal Not Present- Abdominal Pain, Bloody Stool, Constipation, Diarrhea, Difficulty Swallowing, Heartburn, Jaundice, Loss of appetitie, Nausea and Vomiting. Male Genitourinary Not Present- Blood in Urine, Discharge, Flank Pain, Incontinence, Painful Urination, Urgency, Urinary frequency, Urinary Retention, Urinating at Night and Weak urinary stream. Musculoskeletal Present- Joint Pain. Not Present- Back Pain, Joint Swelling, Morning Stiffness, Muscle Pain, Muscle Weakness and Spasms. Neurological Not Present- Blackout spells, Difficulty with balance, Dizziness, Paralysis, Tremor and Weakness. Psychiatric Not Present- Insomnia.  Vitals  Weight: 160 lb Height: 69in Body Surface Area: 1.88 m Body Mass Index: 23.63 kg/m  Pulse: 68 (Regular)  BP: 142/80 (Sitting, Right Arm, Standard)   Physical Exam General Mental Status -Alert, cooperative and good historian. General Appearance-pleasant, Not in acute distress. Orientation-Oriented X3. Build & Nutrition-Well nourished and Well developed.  Head and Neck Head-normocephalic, atraumatic . Neck Global Assessment - supple, no bruit auscultated on the right, no bruit auscultated on the left.  Eye Vision-Wears corrective lenses. Pupil - Bilateral-Regular and Round. Motion - Bilateral-EOMI.  Chest and Lung Exam Auscultation Breath sounds - clear at anterior chest wall and clear at posterior chest wall. Adventitious sounds - No Adventitious sounds.  Cardiovascular Auscultation Rhythm - Regular rate and rhythm. Heart Sounds - S1 WNL and S2 WNL. Murmurs & Other Heart Sounds - Auscultation of the heart reveals - No Murmurs.  Abdomen Palpation/Percussion Tenderness - Abdomen is non-tender to palpation. Rigidity (guarding) -  Abdomen is soft. Auscultation Auscultation of the abdomen reveals - Bowel sounds normal.  Male Genitourinary Note: Not done, not pertinent to present illness   Musculoskeletal Note: he has very limited rotation of his hips. He has full extension of the hip. Further flexion to 115 degrees bilaterally. External rotation of about 20 degrees bilaterally and internal rotation to neutral bilaterally with discomfort in the anterior groin on internal rotation. Sensation and circulation are intact.  RADIOGRAPHS X-rays reveal advanced osteoarthritis of both hips with bone on bone superior weightbearing dome findings, cystic degeneration of both acetabular and femoral head, and inferior medial osteophytes.   Assessment & Plan  Osteoarthritis of right hip, unspecified osteoarthritis type (M16.11) Osteoarthritis of left hip, unspecified osteoarthritis type (M16.12)  Note:Surgical Plans: Left Total Hip Replacement - Anterior Approach  Disposition: Home  PCP: Dr. Sharlet Salina  IV TXA  Anesthesia Issues: None  Signed electronically by Ok Edwards, III PA-C

## 2016-09-19 NOTE — H&P (Signed)
Office Visit Report     09/15/2016   --------------------------------------------------------------------------------   Timothy Bentley  MRN: [id number]  PRIMARY CARE:  Pricilla Holm  DOB: Mar 28, 1951, 65 year old Male  REFERRING:    SSN: [id number]  PROVIDER:  Irine Seal, M.D.    LOCATION:  [facility] [phone]   --------------------------------------------------------------------------------   CC: I have ureteral stone.  HPI: Timothy Bentley is a 65 year-old male patient who is here for ureteral stone.  The problem is on the left side. He first stated noticing pain on 08/28/2016. This is not his first kidney stone. He is not currently having flank pain, back pain, groin pain, nausea, vomiting, fever or chills. He has not caught a stone in his urine strainer since his symptoms began.   He has never had surgical treatment for calculi in the past.   [name] is a 65 yo WM who was sent by his PCP for a 67m left proximal stone. He had his initial left flank pain over Thanksgiving and has had intermittent symptoms. He had a CT on Friday and the stone was proximal. He has had no gross hematuria. He had some nausea. He has had no fever. He had a stone in 2002 and saw Dr. GRisa Grill      ALLERGIES: None   MEDICATIONS: None   GU PSH: None   NON-GU PSH: None   GU PMH: Kidney Stone      PMH Notes:  1898-10-06 00:00:00 - Note: Normal Routine History And Physical Adult   NON-GU PMH: Cardiac murmur, unspecified GERD    FAMILY HISTORY: heart failure - Father   SOCIAL HISTORY: Marital Status: Single Current Smoking Status: Patient has never smoked.  Has never drank.  Does not drink caffeine.     Notes: Tennis pro at [employer]    REVIEW OF SYSTEMS:    GU Review Male:   Patient reports get up at night to urinate. Patient denies frequent urination, hard to postpone urination, burning/ pain with urination, leakage of urine, stream starts and stops, trouble starting your  stream, have to strain to urinate , erection problems, and penile pain.  Gastrointestinal (Upper):   Patient reports indigestion/ heartburn. Patient denies nausea and vomiting.  Gastrointestinal (Lower):   Patient denies diarrhea and constipation.  Constitutional:   Patient denies fever, night sweats, weight loss, and fatigue.  Skin:   Patient denies skin rash/ lesion and itching.  Eyes:   Patient denies blurred vision and double vision.  Ears/ Nose/ Throat:   Patient denies sore throat and sinus problems.  Hematologic/Lymphatic:   Patient denies swollen glands and easy bruising.  Cardiovascular:   Patient denies leg swelling and chest pains.  Respiratory:   Patient denies cough and shortness of breath.  Endocrine:   Patient denies excessive thirst.  Musculoskeletal:   Patient denies joint pain and back pain.  Neurological:   Patient denies headaches and dizziness.  Psychologic:   Patient denies depression and anxiety.   VITAL SIGNS:      09/15/2016 02:56 PM  Weight 160 lb / 72.57 kg  Height 69 in / 175.26 cm  BP 177/89 mmHg  Heart Rate 68 /min  BMI 23.6 kg/m   MULTI-SYSTEM PHYSICAL EXAMINATION:    Constitutional: Well-nourished. No physical deformities. Normally developed. Good grooming.  Neck: Neck symmetrical, not swollen. Normal tracheal position.  Respiratory: No labored breathing, no use of accessory muscles. CTA  Cardiovascular: Normal temperature, . RRR without murmur.  Lymphatic: No enlargement,  no tenderness of axillae, neck lymph nodes.  Skin: No paleness, no jaundice, no cyanosis. No lesion, no ulcer, no rash.  Neurologic / Psychiatric: Oriented to time, oriented to place, oriented to person. No depression, no anxiety, no agitation.  Gastrointestinal: No mass, no tenderness, no rigidity, non obese abdomen.  Musculoskeletal: Normal gait and station of head and neck.     PAST DATA REVIEWED:  Source Of History:  Patient  Records Review:   Previous Doctor Records  Urine  Test Review:   Urinalysis  X-Ray Review: C.T. Stone Protocol: Reviewed Films. Reviewed Report. 9 x 6 x 82m left proximal stone with obstruction.   Notes:                     Records from GTerri PiedraFNP reviewed.    PROCEDURES:         KUB - 74000  A single view of the abdomen is obtained. There is gas and stool over the LUQ but I think I can see his stone. There is a density over the left lumbosacral area but it is too medial to be a stone and correlates with increased bone density seen on CT in that area. He has lumbar degenerative disease and bilateral hip arthritis but no other gas or soft tissues findings.       His stone is unchanged but somewhat difficult to visualize.          Urinalysis - 81003 Dipstick Dipstick Cont'd  Color: Yellow Bilirubin: Neg  Appearance: Clear Ketones: Neg  Specific Gravity: 1.015 Blood: Neg  pH: 6.5 Protein: Neg  Glucose: Neg Urobilinogen: 0.2    Nitrites: Neg    Leukocyte Esterase: Neg    Notes:      ASSESSMENT:      ICD-10 Details  1 GU:   Calculus Ureter - N20.1 Left, 9162m800HU proximal stone on the left.    PLAN:           Orders X-Rays: KUB          Schedule Return Visit: ASAP - Schedule Surgery          Document Letter(s):  Created for Patient: Clinical Summary         Notes:   He has a 62m82meft proximal stone that is medial and moderate density with 800HU. It is somewhat obscured by gas at this time, but should be visible with fluoro and oblique position.   I discussed the options including MET, ESWL and ureteroscopy and will get him set up for ESWL. I reviewed the risks of bleeding, infection, injury to the kidney or adjacent organs, need for secondary procedures for failure of fragmentation or obstructing fragments, inability to visualize the stone, thrombotic events and anesthetic complications.   CC: Dr. EliPricilla Holm  Signed by JohIrine Seal.D. on 09/15/16 at 4:25 PM (EST)     The information contained in  this medical record document is considered private and confidential patient information. This information can only be used for the medical diagnosis and/or medical services that are being provided by the patient's selected caregivers. This information can only be distributed outside of the patient's care if the patient agrees and signs waivers of authorization for this information to be sent to an outside source or route.

## 2016-09-22 ENCOUNTER — Ambulatory Visit (HOSPITAL_COMMUNITY): Payer: BLUE CROSS/BLUE SHIELD

## 2016-09-22 ENCOUNTER — Encounter (HOSPITAL_COMMUNITY): Payer: Self-pay | Admitting: General Practice

## 2016-09-22 ENCOUNTER — Ambulatory Visit (HOSPITAL_COMMUNITY)
Admission: RE | Admit: 2016-09-22 | Discharge: 2016-09-22 | Disposition: A | Discharge status: Home / Self Care | Payer: BLUE CROSS/BLUE SHIELD | Source: Ambulatory Visit | Attending: Urology | Admitting: Urology

## 2016-09-22 ENCOUNTER — Encounter (HOSPITAL_COMMUNITY)
Admission: RE | Disposition: A | Discharge status: Home / Self Care | Payer: Self-pay | Source: Ambulatory Visit | Attending: Urology

## 2016-09-22 DIAGNOSIS — Z8249 Family history of ischemic heart disease and other diseases of the circulatory system: Secondary | ICD-10-CM | POA: Insufficient documentation

## 2016-09-22 DIAGNOSIS — N201 Calculus of ureter: Secondary | ICD-10-CM | POA: Insufficient documentation

## 2016-09-22 HISTORY — DX: Cardiac murmur, unspecified: R01.1

## 2016-09-22 HISTORY — DX: Personal history of urinary calculi: Z87.442

## 2016-09-22 SURGERY — LITHOTRIPSY, ESWL
Anesthesia: LOCAL | Laterality: Left

## 2016-09-22 MED ORDER — CIPROFLOXACIN HCL 500 MG PO TABS
500.0000 mg | ORAL_TABLET | ORAL | Status: AC
  Administered 2016-09-22: 500 mg via ORAL
  Filled 2016-09-22: qty 1

## 2016-09-22 MED ORDER — DIAZEPAM 5 MG PO TABS
10.0000 mg | ORAL_TABLET | ORAL | Status: AC
  Administered 2016-09-22: 10 mg via ORAL
  Filled 2016-09-22: qty 2

## 2016-09-22 MED ORDER — SODIUM CHLORIDE 0.9 % IV SOLN
INTRAVENOUS | Status: DC
  Administered 2016-09-22: 07:00:00 via INTRAVENOUS

## 2016-09-22 MED ORDER — DIPHENHYDRAMINE HCL 25 MG PO CAPS
25.0000 mg | ORAL_CAPSULE | ORAL | Status: AC
  Administered 2016-09-22: 25 mg via ORAL
  Filled 2016-09-22: qty 1

## 2016-09-22 NOTE — Interval H&P Note (Signed)
History and Physical Interval Note:  09/22/2016 8:12 AM  Timothy Bentley  has presented today for surgery, with the diagnosis of LEFT PROXIMAL STONE  The various methods of treatment have been discussed with the patient and family. After consideration of risks, benefits and other options for treatment, the patient has consented to  Procedure(s): LEFT EXTRACORPOREAL SHOCK WAVE LITHOTRIPSY (ESWL) (Left) as a surgical intervention .  The patient's history has been reviewed, patient examined, no change in status, stable for surgery.  I have reviewed the patient's chart and labs.  Questions were answered to the patient's satisfaction.     Garren Greenman I Marcella Charlson

## 2016-09-22 NOTE — Discharge Instructions (Signed)

## 2016-10-02 ENCOUNTER — Ambulatory Visit (HOSPITAL_COMMUNITY)
Admission: RE | Admit: 2016-10-02 | Discharge: 2016-10-02 | Disposition: A | Discharge status: Home / Self Care | Payer: BLUE CROSS/BLUE SHIELD | Source: Ambulatory Visit | Attending: Internal Medicine | Admitting: Internal Medicine

## 2016-10-02 DIAGNOSIS — N1330 Unspecified hydronephrosis: Secondary | ICD-10-CM | POA: Insufficient documentation

## 2016-10-02 DIAGNOSIS — B182 Chronic viral hepatitis C: Secondary | ICD-10-CM | POA: Diagnosis present

## 2016-10-09 ENCOUNTER — Encounter: Payer: Self-pay | Admitting: Internal Medicine

## 2016-10-09 ENCOUNTER — Ambulatory Visit (INDEPENDENT_AMBULATORY_CARE_PROVIDER_SITE_OTHER): Payer: BLUE CROSS/BLUE SHIELD | Admitting: Internal Medicine

## 2016-10-09 DIAGNOSIS — J069 Acute upper respiratory infection, unspecified: Secondary | ICD-10-CM | POA: Insufficient documentation

## 2016-10-09 DIAGNOSIS — B9789 Other viral agents as the cause of diseases classified elsewhere: Secondary | ICD-10-CM

## 2016-10-09 MED ORDER — PREDNISONE 20 MG PO TABS: 40.0000 mg | ORAL_TABLET | Freq: Every day | ORAL | 0 refills | Status: DC

## 2016-10-09 MED ORDER — HYDROCODONE-HOMATROPINE 5-1.5 MG/5ML PO SYRP: 5.0000 mL | ORAL_SOLUTION | Freq: Three times a day (TID) | ORAL | 0 refills | Status: DC | PRN

## 2016-10-09 NOTE — Progress Notes (Signed)
Pre visit review using our clinic review tool, if applicable. No additional management support is needed unless otherwise documented below in the visit note. 

## 2016-10-09 NOTE — Patient Instructions (Addendum)
Edword Pridgeon  10/09/2016   Your procedure is scheduled on: 10-15-16  Report to [facility]  Entrance take [facility]  elevators to 3rd floor to  [place] at  1015  AM.  Call this number if you have problems the morning of surgery [phone]   Remember: ONLY 1 PERSON MAY GO WITH YOU TO SHORT STAY TO GET  READY MORNING OF [place].  Do not eat food or drink liquids :After Midnight.     Take these medicines the morning of surgery with A SIP OF WATER: none                               You may not have any metal on your body including hair pins and              piercings  Do not wear jewelry, make-up, lotions, powders or perfumes, deodorant             Do not wear nail polish.  Do not shave  48 hours prior to surgery.              Men may shave face and neck.   Do not bring valuables to the hospital. [place].  Contacts, dentures or bridgework may not be worn into surgery.  Leave suitcase in the car. After surgery it may be brought to your room.               Please read over the following fact sheets you were given: _____________________________________________________________________             [facility] [place] - Preparing for Surgery Before surgery, you can play an important role.  Because skin is not sterile, your skin needs to be as free of germs as possible.  You can reduce the number of germs on your skin by washing with CHG (chlorahexidine gluconate) soap before surgery.  CHG is an antiseptic cleaner which kills germs and bonds with the skin to continue killing germs even after washing. Please DO NOT use if you have an allergy to CHG or antibacterial soaps.  If your skin becomes reddened/irritated stop using the CHG and inform your nurse when you arrive at Short Stay. Do not shave (including legs and underarms) for at least 48 hours prior to the first CHG shower.  You may shave your  face/neck. Please follow these instructions carefully:  1.  Shower with CHG Soap the night before surgery and the  morning of Surgery.  2.  If you choose to wash your hair, wash your hair first as usual with your  normal  shampoo.  3.  After you shampoo, rinse your hair and body thoroughly to remove the  shampoo.                           4.  Use CHG as you would any other liquid soap.  You can apply chg directly  to the skin and wash                       Gently with a scrungie or clean washcloth.  5.  Apply the CHG Soap to your body  ONLY FROM THE NECK DOWN.   Do not use on face/ open                           Wound or open sores. Avoid contact with eyes, ears mouth and genitals (private parts).                       Wash face,  Genitals (private parts) with your normal soap.             6.  Wash thoroughly, paying special attention to the area where your surgery  will be performed.  7.  Thoroughly rinse your body with warm water from the neck down.  8.  DO NOT shower/wash with your normal soap after using and rinsing off  the CHG Soap.                9.  Pat yourself dry with a clean towel.            10.  Wear clean pajamas.            11.  Place clean sheets on your bed the night of your first shower and do not  sleep with pets. Day of Surgery : Do not apply any lotions/deodorants the morning of surgery.  Please wear clean clothes to the hospital/surgery center.  FAILURE TO FOLLOW THESE INSTRUCTIONS MAY RESULT IN THE CANCELLATION OF YOUR SURGERY PATIENT SIGNATURE_________________________________  NURSE SIGNATURE__________________________________  ________________________________________________________________________   Adam Phenix  An incentive spirometer is a tool that can help keep your lungs clear and active. This tool measures how well you are filling your lungs with each breath. Taking long deep breaths may help reverse or decrease the chance of developing breathing  (pulmonary) problems (especially infection) following:  A long period of time when you are unable to move or be active. BEFORE THE PROCEDURE   If the spirometer includes an indicator to show your best effort, your nurse or respiratory therapist will set it to a desired goal.  If possible, sit up straight or lean slightly forward. Try not to slouch.  Hold the incentive spirometer in an upright position. INSTRUCTIONS FOR USE  1. Sit on the edge of your bed if possible, or sit up as far as you can in bed or on a chair. 2. Hold the incentive spirometer in an upright position. 3. Breathe out normally. 4. Place the mouthpiece in your mouth and seal your lips tightly around it. 5. Breathe in slowly and as deeply as possible, raising the piston or the ball toward the top of the column. 6. Hold your breath for 3-5 seconds or for as long as possible. Allow the piston or ball to fall to the bottom of the column. 7. Remove the mouthpiece from your mouth and breathe out normally. 8. Rest for a few seconds and repeat Steps 1 through 7 at least 10 times every 1-2 hours when you are awake. Take your time and take a few normal breaths between deep breaths. 9. The spirometer may include an indicator to show your best effort. Use the indicator as a goal to work toward during each repetition. 10. After each set of 10 deep breaths, practice coughing to be sure your lungs are clear. If you have an incision (the cut made at the time of surgery), support your incision when coughing by placing a pillow or rolled up towels firmly against it. Once  you are able to get out of bed, walk around indoors and cough well. You may stop using the incentive spirometer when instructed by your caregiver.  RISKS AND COMPLICATIONS  Take your time so you do not get dizzy or light-headed.  If you are in pain, you may need to take or ask for pain medication before doing incentive spirometry. It is harder to take a deep breath if you  are having pain. AFTER USE  Rest and breathe slowly and easily.  It can be helpful to keep track of a log of your progress. Your caregiver can provide you with a simple table to help with this. If you are using the spirometer at home, follow these instructions: Ord IF:   You are having difficultly using the spirometer.  You have trouble using the spirometer as often as instructed.  Your pain medication is not giving enough relief while using the spirometer.  You develop fever of 100.5 F (38.1 C) or higher. SEEK IMMEDIATE MEDICAL CARE IF:   You cough up bloody sputum that had not been present before.  You develop fever of 102 F (38.9 C) or greater.  You develop worsening pain at or near the incision site. MAKE SURE YOU:   Understand these instructions.  Will watch your condition.  Will get help right away if you are not doing well or get worse. Document Released: 02/02/2007 Document Revised: 12/15/2011 Document Reviewed: 04/05/2007 ExitCare Patient Information 2014 ExitCare, [place].   ________________________________________________________________________  WHAT IS A BLOOD TRANSFUSION? Blood Transfusion Information  A transfusion is the replacement of blood or some of its parts. Blood is made up of multiple cells which provide different functions.  Red blood cells carry oxygen and are used for blood loss replacement.  White blood cells fight against infection.  Platelets control bleeding.  Plasma helps clot blood.  Other blood products are available for specialized needs, such as hemophilia or other clotting disorders. BEFORE THE TRANSFUSION  Who gives blood for transfusions?   Healthy volunteers who are fully evaluated to make sure their blood is safe. This is blood bank blood. Transfusion therapy is the safest it has ever been in the practice of medicine. Before blood is taken from a donor, a complete history is taken to make sure that person has  no history of diseases nor engages in risky social behavior (examples are intravenous drug use or sexual activity with multiple partners). The donor's travel history is screened to minimize risk of transmitting infections, such as malaria. The donated blood is tested for signs of infectious diseases, such as HIV and hepatitis. The blood is then tested to be sure it is compatible with you in order to minimize the chance of a transfusion reaction. If you or a relative donates blood, this is often done in anticipation of surgery and is not appropriate for emergency situations. It takes many days to process the donated blood. RISKS AND COMPLICATIONS Although transfusion therapy is very safe and saves many lives, the main dangers of transfusion include:   Getting an infectious disease.  Developing a transfusion reaction. This is an allergic reaction to something in the blood you were given. Every precaution is taken to prevent this. The decision to have a blood transfusion has been considered carefully by your caregiver before blood is given. Blood is not given unless the benefits outweigh the risks. AFTER THE TRANSFUSION  Right after receiving a blood transfusion, you will usually feel much better and more energetic. This  is especially true if your red blood cells have gotten low (anemic). The transfusion raises the level of the red blood cells which carry oxygen, and this usually causes an energy increase.  The nurse administering the transfusion will monitor you carefully for complications. HOME CARE INSTRUCTIONS  No special instructions are needed after a transfusion. You may find your energy is better. Speak with your caregiver about any limitations on activity for underlying diseases you may have. SEEK MEDICAL CARE IF:   Your condition is not improving after your transfusion.  You develop redness or irritation at the intravenous (IV) site. SEEK IMMEDIATE MEDICAL CARE IF:  Any of the following  symptoms occur over the next 12 hours:  Shaking chills.  You have a temperature by mouth above 102 F (38.9 C), not controlled by medicine.  Chest, back, or muscle pain.  People around you feel you are not acting correctly or are confused.  Shortness of breath or difficulty breathing.  Dizziness and fainting.  You get a rash or develop hives.  You have a decrease in urine output.  Your urine turns a dark color or changes to pink, red, or brown. Any of the following symptoms occur over the next 10 days:  You have a temperature by mouth above 102 F (38.9 C), not controlled by medicine.  Shortness of breath.  Weakness after normal activity.  The white part of the eye turns yellow (jaundice).  You have a decrease in the amount of urine or are urinating less often.  Your urine turns a dark color or changes to pink, red, or brown. Document Released: 09/19/2000 Document Revised: 12/15/2011 Document Reviewed: 05/08/2008 [facility] Patient Information 2014 [place], [place].  _______________________________________________________________________

## 2016-10-09 NOTE — Progress Notes (Signed)
   Subjective:    Patient ID: Timothy Bentley, male    DOB: 1951-05-14, 66 y.o.   MRN: [id number]  HPI The patient is a 66 YO man coming in for cold symptoms. Started about 2 weeks ago and overall is improving. Started with lots of nasal symptoms and cough and sore throat. Now left with some cough. No SOB. No fevers or chills. Worried because he is scheduled for surgery next week for hip replacement and wants to make sure he is safe to get that done.   Review of Systems  Constitutional: Negative for activity change, appetite change, fatigue, fever and unexpected weight change.  HENT: Positive for congestion. Negative for ear discharge, ear pain, postnasal drip, rhinorrhea, sinus pain, sinus pressure, sore throat and trouble swallowing.   Eyes: Negative.   Respiratory: Positive for cough. Negative for chest tightness, shortness of breath and wheezing.   Cardiovascular: Negative.   Gastrointestinal: Negative.   Musculoskeletal: Positive for arthralgias.  Neurological: Negative.       Objective:   Physical Exam  Constitutional: He is oriented to person, place, and time. He appears well-developed and well-nourished.  HENT:  Head: Normocephalic and atraumatic.  Right Ear: External ear normal.  Left Ear: External ear normal.  Oropharynx with redness, no drainage no nasal crusting.   Eyes: EOM are normal.  Neck: Normal range of motion.  Cardiovascular: Normal rate and regular rhythm.   Pulmonary/Chest: Effort normal and breath sounds normal. No respiratory distress. He has no wheezes. He has no rales.  Abdominal: Soft.  Lymphadenopathy:    He has no cervical adenopathy.  Neurological: He is alert and oriented to person, place, and time.  Skin: Skin is warm and dry.   Vitals:   10/09/16 1602  BP: (!) 162/80  Pulse: 77  Resp: 16  Temp: 98.1 F (36.7 C)  TempSrc: Oral  SpO2: 98%  Weight: 162 lb 4 oz (73.6 kg)  Height: 5\' 9"  (1.753 m)      Assessment & Plan:

## 2016-10-09 NOTE — Patient Instructions (Signed)
We have given you the cough syrup that you can use for cough.   We have also sent in prednisone which you can take to help the lungs clear faster. If you decide to take, take 2 pills daily for 5 days.  You are still safe to get the surgery next week as long as you are feeling well enough. The lungs are clear on exam today.

## 2016-10-09 NOTE — Assessment & Plan Note (Signed)
Virus is cleared at this time and only residual cough left. Rx for hycodan syrup as well as prednisone which is prescribed to help optimize lungs prior to surgery. He will consider if he wants to take that. He is cleared for the surgery next week even if he does not take prednisone as exercise tolerance is still good and lungs clear.

## 2016-10-10 ENCOUNTER — Encounter (HOSPITAL_COMMUNITY): Payer: Self-pay

## 2016-10-10 ENCOUNTER — Encounter (HOSPITAL_COMMUNITY)
Admission: RE | Admit: 2016-10-10 | Discharge: 2016-10-10 | Disposition: A | Discharge status: Home / Self Care | Payer: BLUE CROSS/BLUE SHIELD | Source: Ambulatory Visit | Attending: Orthopedic Surgery | Admitting: Orthopedic Surgery

## 2016-10-10 DIAGNOSIS — Z01812 Encounter for preprocedural laboratory examination: Secondary | ICD-10-CM | POA: Diagnosis present

## 2016-10-10 DIAGNOSIS — M1610 Unilateral primary osteoarthritis, unspecified hip: Secondary | ICD-10-CM | POA: Insufficient documentation

## 2016-10-10 LAB — SURGICAL PCR SCREEN
MRSA, PCR: NEGATIVE
STAPHYLOCOCCUS AUREUS: POSITIVE — AB

## 2016-10-10 LAB — COMPREHENSIVE METABOLIC PANEL
ALK PHOS: 68 U/L (ref 38–126)
ALT: 76 U/L — AB (ref 17–63)
AST: 52 U/L — ABNORMAL HIGH (ref 15–41)
Albumin: 4.5 g/dL (ref 3.5–5.0)
Anion gap: 9 (ref 5–15)
BILIRUBIN TOTAL: 0.8 mg/dL (ref 0.3–1.2)
BUN: 22 mg/dL — ABNORMAL HIGH (ref 6–20)
CALCIUM: 9.6 mg/dL (ref 8.9–10.3)
CO2: 28 mmol/L (ref 22–32)
CREATININE: 0.69 mg/dL (ref 0.61–1.24)
Chloride: 101 mmol/L (ref 101–111)
GFR calc non Af Amer: >60 mL/min (ref >60)
GLUCOSE: 85 mg/dL (ref 65–99)
Potassium: 4 mmol/L (ref 3.5–5.1)
SODIUM: 138 mmol/L (ref 135–145)
TOTAL PROTEIN: 7.2 g/dL (ref 6.5–8.1)

## 2016-10-10 LAB — ABO/RH: ABO/RH(D): O POS

## 2016-10-10 LAB — CBC
HEMATOCRIT: 46 % (ref 39.0–52.0)
HEMOGLOBIN: 16.2 g/dL (ref 13.0–17.0)
MCH: 30.7 pg (ref 26.0–34.0)
MCHC: 35.2 g/dL (ref 30.0–36.0)
MCV: 87.3 fL (ref 78.0–100.0)
Platelets: 159 10*3/uL (ref 150–400)
RBC: 5.27 MIL/uL (ref 4.22–5.81)
RDW: 13 % (ref 11.5–15.5)
WBC: 5.7 10*3/uL (ref 4.0–10.5)

## 2016-10-10 LAB — URINALYSIS, ROUTINE W REFLEX MICROSCOPIC
Bilirubin Urine: NEGATIVE
Glucose, UA: NEGATIVE mg/dL
Hgb urine dipstick: NEGATIVE
Ketones, ur: NEGATIVE mg/dL
LEUKOCYTES UA: NEGATIVE
Nitrite: NEGATIVE
PROTEIN: NEGATIVE mg/dL
Specific Gravity, Urine: 1.017 (ref 1.005–1.030)
pH: 5 (ref 5.0–8.0)

## 2016-10-10 LAB — PROTIME-INR
INR: 1.02
Prothrombin Time: 13.5 seconds (ref 11.4–15.2)

## 2016-10-10 LAB — APTT: aPTT: 31 seconds (ref 24–36)

## 2016-10-13 ENCOUNTER — Other Ambulatory Visit: Payer: Self-pay | Admitting: Pharmacist

## 2016-10-13 DIAGNOSIS — B182 Chronic viral hepatitis C: Secondary | ICD-10-CM

## 2016-10-14 ENCOUNTER — Other Ambulatory Visit: Payer: BLUE CROSS/BLUE SHIELD

## 2016-10-14 ENCOUNTER — Ambulatory Visit: Payer: Self-pay | Admitting: Orthopedic Surgery

## 2016-10-14 DIAGNOSIS — B182 Chronic viral hepatitis C: Secondary | ICD-10-CM

## 2016-10-14 NOTE — H&P (Signed)
Gio California DOB: 14-Apr-1951 Single / Language: English / Race: White Male Date of Admission;  10/15/2016 CC:  Left Hip Pain History of Present Illness The patient is a 66 year old male who comes in for a preoperative History and Physical. The patient is scheduled for a left total hip arthroplasty (anterior) to be performed by Dr. Dione Plover. Aluisio, MD at [facility] on 10/15/2016. The patient is a 66 year old male who presented for follow up of their hip. The patient is being followed for their bilateral osteoarthritis. Symptoms reported include: pain, stiffness and difficulty ambulating. The patient feels that they are doing well and report their pain level to be moderate. He states the pain changes with activity. He has more loss of motion especially on the rt. There is some pain in the lt. hip as well. [place] is being followed for bilateral hip osteoarthritis. He has stiffness, intermittent pain, worse with weightbearing, but still tolerable and he is able to get along with it at this point. He is just in for his recheck as recommended by Dr. Wynelle Link. His pain is in the anterior groin of both hips, worse on the right than the left. The patient feels that they are doing poorly and report their pain level to be moderate. Current treatment includes: home exercise program (stretching). The following medication has been used for pain control: none. AP and lateral views of the hip show end stage osteoarthritis in both hips with cycstic changes noted in the femoral heads and acetabulum. Large osteophyte formation mostly inferior about the joint. No fractures noted but starting to see some collapse of the femoral heads, left greater than right. He has reached a point where he would like to get the hip fixed. He wants to proceed with the left hip at this time. They have been treated conservatively in the past for the above stated problem and despite conservative measures, they continue to have  progressive pain and severe functional limitations and dysfunction. They have failed non-operative management including home exercise, medications. It is felt that they would benefit from undergoing total joint replacement. Risks and benefits of the procedure have been discussed with the patient and they elect to proceed with surgery. There are no active contraindications to surgery such as ongoing infection or rapidly progressive neurological disease.  Problem List/Past Medical  Primary osteoarthritis of both hips (M16.0)  Osteoarthritis of right hip, unspecified osteoarthritis type (M16.11)  Heart murmur  Chondromalacia, patella (M22.40) [08/15/2002]: Tear, lateral meniscus, knee, current (836.1) [08/15/2002]: Pain in joint, shoulder region (M25.519) [02/02/2009]: Disorder, shoulder region NEC (726.2) [02/02/2009]: Diverticulosis  Kidney Stone  Allergies  No Known Drug Allergies   Family History Heart Disease  Father. First Degree Relatives   Social History  Not under pain contract  No history of drug/alcohol rehab  Number of flights of stairs before winded  greater than 5 Tobacco use  Never smoker. 06/09/2014 Tobacco / smoke exposure  06/09/2014: no Marital status  single Current work status  working full time Children  0 Exercise  Exercises daily; does individual sport, other and gym / [employer] Living situation  live alone Former drinker  06/09/2014: In the past drank beer, wine and hard liquor  Medication History No Current Medications  Past Surgical History  No pertinent past surgical history     Review of Systems General Not Present- Chills, Fatigue, Fever, Memory Loss, Night Sweats, Weight Gain and Weight Loss. Skin Not Present- Eczema, Hives, Itching, Lesions and Rash. HEENT  Not Present- Dentures, Double Vision, Headache, Hearing Loss, Tinnitus and Visual Loss. Respiratory Not Present- Allergies, Chronic Cough, Coughing up blood, Shortness  of breath at rest and Shortness of breath with exertion. Cardiovascular Not Present- Chest Pain, Difficulty Breathing Lying Down, Murmur, Palpitations, Racing/skipping heartbeats and Swelling. Gastrointestinal Not Present- Abdominal Pain, Bloody Stool, Constipation, Diarrhea, Difficulty Swallowing, Heartburn, Jaundice, Loss of appetitie, Nausea and Vomiting. Male Genitourinary Not Present- Blood in Urine, Discharge, Flank Pain, Incontinence, Painful Urination, Urgency, Urinary frequency, Urinary Retention, Urinating at Night and Weak urinary stream. Musculoskeletal Present- Joint Pain. Not Present- Back Pain, Joint Swelling, Morning Stiffness, Muscle Pain, Muscle Weakness and Spasms. Neurological Not Present- Blackout spells, Difficulty with balance, Dizziness, Paralysis, Tremor and Weakness. Psychiatric Not Present- Insomnia.  Vitals  Weight: 160 lb Height: 69in Body Surface Area: 1.88 m Body Mass Index: 23.63 kg/m  Pulse: 68 (Regular)  BP: 142/80 (Sitting, Right Arm, Standard)   Physical Exam  General Mental Status -Alert, cooperative and good historian. General Appearance-pleasant, Not in acute distress. Orientation-Oriented X3. Build & Nutrition-Well nourished and Well developed.  Head and Neck Head-normocephalic, atraumatic . Neck Global Assessment - supple, no bruit auscultated on the right, no bruit auscultated on the left.  Eye Vision-Wears corrective lenses. Pupil - Bilateral-Regular and Round. Motion - Bilateral-EOMI.  Chest and Lung Exam Auscultation Breath sounds - clear at anterior chest wall and clear at posterior chest wall. Adventitious sounds - No Adventitious sounds.  Cardiovascular Auscultation Rhythm - Regular rate and rhythm. Heart Sounds - S1 WNL and S2 WNL. Murmurs & Other Heart Sounds - Auscultation of the heart reveals - No Murmurs.  Abdomen Palpation/Percussion Tenderness - Abdomen is non-tender to palpation. Rigidity  (guarding) - Abdomen is soft. Auscultation Auscultation of the abdomen reveals - Bowel sounds normal.  Male Genitourinary Note: Not done, not pertinent to present illness   Musculoskeletal Note: he has very limited rotation of his hips. He has full extension of the hip. Further flexion to 115 degrees bilaterally. External rotation of about 20 degrees bilaterally and internal rotation to neutral bilaterally with discomfort in the anterior groin on internal rotation. Sensation and circulation are intact.  RADIOGRAPHS X-rays reveal advanced osteoarthritis of both hips with bone on bone superior weightbearing dome findings, cystic degeneration of both acetabular and femoral head, and inferior medial osteophytes.   Assessment & Plan Osteoarthritis of right hip, unspecified osteoarthritis type (M16.11) Osteoarthritis of left hip, unspecified osteoarthritis type (M16.12)  Note:Surgical Plans: Left Total Hip Replacement - Anterior Approach  Disposition: Home  PCP: Dr. Sharlet Salina  IV TXA  Anesthesia Issues: None  Signed electronically by Ok Edwards, III PA-C

## 2016-10-14 NOTE — H&P (Unsigned)
Timothy Bentley DOB: 05-12-1951 Single / Language: English / Race: White Male Date of Admission:  10/15/2016  CC:  Left Hip pain History of Present Illness The patient is a 66 year old male who comes in  for a preoperative History and Physical. The patient is scheduled for a left total hip arthroplasty (anterior) to be performed by Dr. Dione Plover. Aluisio, MD at [facility] on 10/15/2016. The patient is a 66 year old male who presented for follow up of their hip. The patient is being followed for their bilateral osteoarthritis. Symptoms reported include: pain, stiffness and difficulty ambulating. The patient feels that they are doing well and report their pain level to be moderate. He states the pain changes with activity. He has more loss of motion especially on the rt. There is some pain in the lt. hip as well. [name] is being followed for bilateral hip osteoarthritis. He has stiffness, intermittent pain, worse with weightbearing, but still tolerable and he is able to get along with it at this point. He is just in for his recheck as recommended by Dr. Wynelle Link. His pain is in the anterior groin of both hips, worse on the right than the left. The patient feels that they are doing poorly and report their pain level to be moderate. Current treatment includes: home exercise program (stretching). The following medication has been used for pain control: none. AP and lateral views of the hip show end stage osteoarthritis in both hips with cycstic changes noted in the femoral heads and acetabulum. Large osteophyte formation mostly inferior about the joint. No fractures noted but starting to see some collapse of the femoral heads, left greater than right. He has reached a point where he would like to get the hip fixed. He wants to proceed with the left hip at this time. They have been treated conservatively in the past for the above stated problem and despite conservative measures, they continue to have  progressive pain and severe functional limitations and dysfunction. They have failed non-operative management including home exercise, medications. It is felt that they would benefit from undergoing total joint replacement. Risks and benefits of the procedure have been discussed with the patient and they elect to proceed with surgery. There are no active contraindications to surgery such as ongoing infection or rapidly progressive neurological disease.   Problem List/Past Medical Primary osteoarthritis of both hips (M16.0)  Osteoarthritis of right hip, unspecified osteoarthritis type (M16.11)  Heart murmur  Chondromalacia, patella (M22.40) [08/15/2002]: Tear, lateral meniscus, knee, current (836.1) [08/15/2002]: Pain in joint, shoulder region (M25.519) [02/02/2009]: Disorder, shoulder region NEC (726.2) [02/02/2009]: Diverticulosis  Kidney Stone   Allergies No Known Drug Allergies   Family History Heart Disease  Father. First Degree Relatives   Social History Not under pain contract  No history of drug/alcohol rehab  Number of flights of stairs before winded  greater than 5 Tobacco use  Never smoker. 06/09/2014 Tobacco / smoke exposure  06/09/2014: no Marital status  single Current work status  working full time Children  0 Exercise  Exercises daily; does individual sport, other and gym / [employer] Living situation  live alone Former drinker  06/09/2014: In the past drank beer, wine and hard liquor  Medication History No Current Medications  Past Surgical History  No pertinent past surgical history    Review of Systems General Not Present- Chills, Fatigue, Fever, Memory Loss, Night Sweats, Weight Gain and Weight Loss. Skin Not Present- Eczema, Hives, Itching, Lesions and Rash. HEENT  Not Present- Dentures, Double Vision, Headache, Hearing Loss, Tinnitus and Visual Loss. Respiratory Not Present- Allergies, Chronic Cough, Coughing up blood, Shortness  of breath at rest and Shortness of breath with exertion. Cardiovascular Not Present- Chest Pain, Difficulty Breathing Lying Down, Murmur, Palpitations, Racing/skipping heartbeats and Swelling. Gastrointestinal Not Present- Abdominal Pain, Bloody Stool, Constipation, Diarrhea, Difficulty Swallowing, Heartburn, Jaundice, Loss of appetitie, Nausea and Vomiting. Male Genitourinary Not Present- Blood in Urine, Discharge, Flank Pain, Incontinence, Painful Urination, Urgency, Urinary frequency, Urinary Retention, Urinating at Night and Weak urinary stream. Musculoskeletal Present- Joint Pain. Not Present- Back Pain, Joint Swelling, Morning Stiffness, Muscle Pain, Muscle Weakness and Spasms. Neurological Not Present- Blackout spells, Difficulty with balance, Dizziness, Paralysis, Tremor and Weakness. Psychiatric Not Present- Insomnia.  Vitals Weight: 160 lb Height: 69in Body Surface Area: 1.88 m Body Mass Index: 23.63 kg/m  Pulse: 68 (Regular)  BP: 142/80 (Sitting, Right Arm, Standard)   Physical Exam General Mental Status -Alert, cooperative and good historian. General Appearance-pleasant, Not in acute distress. Orientation-Oriented X3. Build & Nutrition-Well nourished and Well developed.  Head and Neck Head-normocephalic, atraumatic . Neck Global Assessment - supple, no bruit auscultated on the right, no bruit auscultated on the left.  Eye Vision-Wears corrective lenses. Pupil - Bilateral-Regular and Round. Motion - Bilateral-EOMI.  Chest and Lung Exam Auscultation Breath sounds - clear at anterior chest wall and clear at posterior chest wall. Adventitious sounds - No Adventitious sounds.  Cardiovascular Auscultation Rhythm - Regular rate and rhythm. Heart Sounds - S1 WNL and S2 WNL. Murmurs & Other Heart Sounds - Auscultation of the heart reveals - No Murmurs.  Abdomen Palpation/Percussion Tenderness - Abdomen is non-tender to palpation. Rigidity  (guarding) - Abdomen is soft. Auscultation Auscultation of the abdomen reveals - Bowel sounds normal.  Male Genitourinary Note: Not done, not pertinent to present illness   Musculoskeletal Note: he has very limited rotation of his hips. He has full extension of the hip. Further flexion to 115 degrees bilaterally. External rotation of about 20 degrees bilaterally and internal rotation to neutral bilaterally with discomfort in the anterior groin on internal rotation. Sensation and circulation are intact.  RADIOGRAPHS X-rays reveal advanced osteoarthritis of both hips with bone on bone superior weightbearing dome findings, cystic degeneration of both acetabular and femoral head, and inferior medial osteophytes.  Assessment & Plan Osteoarthritis of right hip, unspecified osteoarthritis type (M16.11) Osteoarthritis of left hip, unspecified osteoarthritis type (M16.12)  Note:Surgical Plans: Left Total Hip Replacement - Anterior Approach  Disposition: Home  PCP: Dr. Sharlet Salina  IV TXA  Anesthesia Issues: None  Signed electronically by Ok Edwards, III PA-C

## 2016-10-15 ENCOUNTER — Encounter (HOSPITAL_COMMUNITY)
Admission: RE | Disposition: A | Discharge status: Home Health | Payer: Self-pay | Source: Ambulatory Visit | Attending: Orthopedic Surgery

## 2016-10-15 ENCOUNTER — Inpatient Hospital Stay (HOSPITAL_COMMUNITY): Payer: BLUE CROSS/BLUE SHIELD

## 2016-10-15 ENCOUNTER — Inpatient Hospital Stay (HOSPITAL_COMMUNITY)
Admission: RE | Admit: 2016-10-15 | Discharge: 2016-10-16 | DRG: 470 | Disposition: A | Discharge status: Home Health | Payer: BLUE CROSS/BLUE SHIELD | Source: Ambulatory Visit | Attending: Orthopedic Surgery | Admitting: Orthopedic Surgery

## 2016-10-15 ENCOUNTER — Inpatient Hospital Stay (HOSPITAL_COMMUNITY): Payer: BLUE CROSS/BLUE SHIELD | Admitting: Anesthesiology

## 2016-10-15 ENCOUNTER — Encounter (HOSPITAL_COMMUNITY): Payer: Self-pay | Admitting: *Deleted

## 2016-10-15 DIAGNOSIS — R011 Cardiac murmur, unspecified: Secondary | ICD-10-CM | POA: Diagnosis present

## 2016-10-15 DIAGNOSIS — M160 Bilateral primary osteoarthritis of hip: Principal | ICD-10-CM | POA: Diagnosis present

## 2016-10-15 DIAGNOSIS — M169 Osteoarthritis of hip, unspecified: Secondary | ICD-10-CM | POA: Diagnosis present

## 2016-10-15 DIAGNOSIS — Z87442 Personal history of urinary calculi: Secondary | ICD-10-CM | POA: Diagnosis not present

## 2016-10-15 DIAGNOSIS — Z96649 Presence of unspecified artificial hip joint: Secondary | ICD-10-CM

## 2016-10-15 DIAGNOSIS — B182 Chronic viral hepatitis C: Secondary | ICD-10-CM | POA: Diagnosis present

## 2016-10-15 HISTORY — PX: TOTAL HIP ARTHROPLASTY: SHX124

## 2016-10-15 LAB — TYPE AND SCREEN
ABO/RH(D): O POS
Antibody Screen: NEGATIVE

## 2016-10-15 SURGERY — ARTHROPLASTY, HIP, TOTAL, ANTERIOR APPROACH
Anesthesia: Spinal | Site: Hip | Laterality: Left

## 2016-10-15 MED ORDER — PROMETHAZINE HCL 25 MG/ML IJ SOLN: 6.2500 mg | INTRAMUSCULAR | Status: DC | PRN

## 2016-10-15 MED ORDER — PROPOFOL 10 MG/ML IV BOLUS
INTRAVENOUS | Status: AC
  Filled 2016-10-15: qty 40

## 2016-10-15 MED ORDER — MORPHINE SULFATE (PF) 2 MG/ML IV SOLN: 1.0000 mg | INTRAVENOUS | Status: DC | PRN

## 2016-10-15 MED ORDER — METOCLOPRAMIDE HCL 5 MG/ML IJ SOLN: 5.0000 mg | Freq: Three times a day (TID) | INTRAMUSCULAR | Status: DC | PRN

## 2016-10-15 MED ORDER — BISACODYL 10 MG RE SUPP: 10.0000 mg | Freq: Every day | RECTAL | Status: DC | PRN

## 2016-10-15 MED ORDER — DEXTROSE 5 % IV SOLN
500.0000 mg | Freq: Four times a day (QID) | INTRAVENOUS | Status: DC | PRN
  Filled 2016-10-15: qty 5

## 2016-10-15 MED ORDER — OXYCODONE HCL 5 MG PO TABS
5.0000 mg | ORAL_TABLET | ORAL | Status: DC | PRN
  Administered 2016-10-15: 10 mg via ORAL
  Administered 2016-10-15 – 2016-10-16 (×4): 5 mg via ORAL
  Filled 2016-10-15 (×2): qty 2
  Filled 2016-10-15: qty 1
  Filled 2016-10-15: qty 2
  Filled 2016-10-15: qty 1

## 2016-10-15 MED ORDER — TRANEXAMIC ACID 1000 MG/10ML IV SOLN
1000.0000 mg | INTRAVENOUS | Status: AC
  Administered 2016-10-15: 1000 mg via INTRAVENOUS
  Filled 2016-10-15: qty 10

## 2016-10-15 MED ORDER — PHENOL 1.4 % MT LIQD
1.0000 | OROMUCOSAL | Status: DC | PRN
  Filled 2016-10-15: qty 177

## 2016-10-15 MED ORDER — FENTANYL CITRATE (PF) 100 MCG/2ML IJ SOLN
INTRAMUSCULAR | Status: DC | PRN
  Administered 2016-10-15: 25 ug via INTRAVENOUS
  Administered 2016-10-15: 50 ug via INTRAVENOUS
  Administered 2016-10-15: 25 ug via INTRAVENOUS

## 2016-10-15 MED ORDER — DEXAMETHASONE SODIUM PHOSPHATE 10 MG/ML IJ SOLN
10.0000 mg | Freq: Once | INTRAMUSCULAR | Status: AC
  Administered 2016-10-16: 10 mg via INTRAVENOUS
  Filled 2016-10-15: qty 1

## 2016-10-15 MED ORDER — ONDANSETRON HCL 4 MG/2ML IJ SOLN
INTRAMUSCULAR | Status: DC | PRN
  Administered 2016-10-15: 4 mg via INTRAVENOUS

## 2016-10-15 MED ORDER — CEFAZOLIN SODIUM-DEXTROSE 2-4 GM/100ML-% IV SOLN
2.0000 g | INTRAVENOUS | Status: AC
  Administered 2016-10-15: 2 g via INTRAVENOUS
  Filled 2016-10-15: qty 100

## 2016-10-15 MED ORDER — ACETAMINOPHEN 10 MG/ML IV SOLN
INTRAVENOUS | Status: AC
  Filled 2016-10-15: qty 100

## 2016-10-15 MED ORDER — ACETAMINOPHEN 650 MG RE SUPP: 650.0000 mg | Freq: Four times a day (QID) | RECTAL | Status: DC | PRN

## 2016-10-15 MED ORDER — PHENYLEPHRINE 40 MCG/ML (10ML) SYRINGE FOR IV PUSH (FOR BLOOD PRESSURE SUPPORT)
PREFILLED_SYRINGE | INTRAVENOUS | Status: AC
  Filled 2016-10-15: qty 10

## 2016-10-15 MED ORDER — ONDANSETRON HCL 4 MG/2ML IJ SOLN: 4.0000 mg | Freq: Four times a day (QID) | INTRAMUSCULAR | Status: DC | PRN

## 2016-10-15 MED ORDER — MEPERIDINE HCL 25 MG/ML IJ SOLN: 6.2500 mg | INTRAMUSCULAR | Status: DC | PRN

## 2016-10-15 MED ORDER — DOCUSATE SODIUM 100 MG PO CAPS
100.0000 mg | ORAL_CAPSULE | Freq: Two times a day (BID) | ORAL | Status: DC
  Administered 2016-10-15 – 2016-10-16 (×2): 100 mg via ORAL
  Filled 2016-10-15 (×2): qty 1

## 2016-10-15 MED ORDER — 0.9 % SODIUM CHLORIDE (POUR BTL) OPTIME
TOPICAL | Status: DC | PRN
  Administered 2016-10-15: 1000 mL

## 2016-10-15 MED ORDER — MENTHOL 3 MG MT LOZG: 1.0000 | LOZENGE | OROMUCOSAL | Status: DC | PRN

## 2016-10-15 MED ORDER — METHOCARBAMOL 500 MG PO TABS
500.0000 mg | ORAL_TABLET | Freq: Four times a day (QID) | ORAL | Status: DC | PRN
  Administered 2016-10-15: 500 mg via ORAL
  Filled 2016-10-15: qty 1

## 2016-10-15 MED ORDER — CEFAZOLIN SODIUM-DEXTROSE 2-4 GM/100ML-% IV SOLN
2.0000 g | Freq: Four times a day (QID) | INTRAVENOUS | Status: AC
  Administered 2016-10-15 – 2016-10-16 (×2): 2 g via INTRAVENOUS
  Filled 2016-10-15 (×2): qty 100

## 2016-10-15 MED ORDER — HYDROMORPHONE HCL 1 MG/ML IJ SOLN: 0.2500 mg | INTRAMUSCULAR | Status: DC | PRN

## 2016-10-15 MED ORDER — PHENYLEPHRINE 40 MCG/ML (10ML) SYRINGE FOR IV PUSH (FOR BLOOD PRESSURE SUPPORT)
PREFILLED_SYRINGE | INTRAVENOUS | Status: DC | PRN
  Administered 2016-10-15 (×5): 40 ug via INTRAVENOUS
  Administered 2016-10-15 (×2): 80 ug via INTRAVENOUS

## 2016-10-15 MED ORDER — CEFAZOLIN SODIUM-DEXTROSE 2-4 GM/100ML-% IV SOLN
INTRAVENOUS | Status: AC
  Filled 2016-10-15: qty 100

## 2016-10-15 MED ORDER — BUPIVACAINE HCL (PF) 0.25 % IJ SOLN
INTRAMUSCULAR | Status: DC | PRN
  Administered 2016-10-15: 30 mL

## 2016-10-15 MED ORDER — BUPIVACAINE HCL (PF) 0.25 % IJ SOLN
INTRAMUSCULAR | Status: AC
  Filled 2016-10-15: qty 30

## 2016-10-15 MED ORDER — TRANEXAMIC ACID 1000 MG/10ML IV SOLN
1000.0000 mg | Freq: Once | INTRAVENOUS | Status: AC
  Administered 2016-10-15: 1000 mg via INTRAVENOUS
  Filled 2016-10-15: qty 10

## 2016-10-15 MED ORDER — ONDANSETRON HCL 4 MG PO TABS: 4.0000 mg | ORAL_TABLET | Freq: Four times a day (QID) | ORAL | Status: DC | PRN

## 2016-10-15 MED ORDER — RIVAROXABAN 10 MG PO TABS
10.0000 mg | ORAL_TABLET | Freq: Every day | ORAL | Status: DC
  Administered 2016-10-16: 10 mg via ORAL
  Filled 2016-10-15: qty 1

## 2016-10-15 MED ORDER — ACETAMINOPHEN 10 MG/ML IV SOLN
1000.0000 mg | Freq: Once | INTRAVENOUS | Status: AC
  Administered 2016-10-15: 1000 mg via INTRAVENOUS
  Filled 2016-10-15: qty 100

## 2016-10-15 MED ORDER — PROPOFOL 10 MG/ML IV BOLUS
INTRAVENOUS | Status: AC
  Filled 2016-10-15: qty 20

## 2016-10-15 MED ORDER — METOCLOPRAMIDE HCL 5 MG PO TABS: 5.0000 mg | ORAL_TABLET | Freq: Three times a day (TID) | ORAL | Status: DC | PRN

## 2016-10-15 MED ORDER — PROPOFOL 10 MG/ML IV BOLUS
INTRAVENOUS | Status: DC | PRN
  Administered 2016-10-15: 30 mg via INTRAVENOUS

## 2016-10-15 MED ORDER — LACTATED RINGERS IV SOLN
INTRAVENOUS | Status: DC
  Administered 2016-10-15 (×4): via INTRAVENOUS

## 2016-10-15 MED ORDER — SODIUM CHLORIDE 0.9 % IV SOLN
INTRAVENOUS | Status: DC
  Administered 2016-10-15: 20:00:00 via INTRAVENOUS

## 2016-10-15 MED ORDER — DEXAMETHASONE SODIUM PHOSPHATE 10 MG/ML IJ SOLN
10.0000 mg | Freq: Once | INTRAMUSCULAR | Status: AC
  Administered 2016-10-15: 10 mg via INTRAVENOUS

## 2016-10-15 MED ORDER — POLYETHYLENE GLYCOL 3350 17 G PO PACK
17.0000 g | PACK | Freq: Every day | ORAL | Status: DC | PRN
  Filled 2016-10-15: qty 1

## 2016-10-15 MED ORDER — FLEET ENEMA 7-19 GM/118ML RE ENEM: 1.0000 | ENEMA | Freq: Once | RECTAL | Status: DC | PRN

## 2016-10-15 MED ORDER — BUPIVACAINE IN DEXTROSE 0.75-8.25 % IT SOLN
INTRATHECAL | Status: DC | PRN
  Administered 2016-10-15: 1.8 mL via INTRATHECAL

## 2016-10-15 MED ORDER — DIPHENHYDRAMINE HCL 12.5 MG/5ML PO ELIX: 12.5000 mg | ORAL_SOLUTION | ORAL | Status: DC | PRN

## 2016-10-15 MED ORDER — MIDAZOLAM HCL 2 MG/2ML IJ SOLN
INTRAMUSCULAR | Status: AC
  Filled 2016-10-15: qty 2

## 2016-10-15 MED ORDER — FENTANYL CITRATE (PF) 100 MCG/2ML IJ SOLN
INTRAMUSCULAR | Status: AC
  Filled 2016-10-15: qty 2

## 2016-10-15 MED ORDER — PROPOFOL 500 MG/50ML IV EMUL
INTRAVENOUS | Status: DC | PRN
  Administered 2016-10-15: 75 ug/kg/min via INTRAVENOUS

## 2016-10-15 MED ORDER — ACETAMINOPHEN 325 MG PO TABS: 650.0000 mg | ORAL_TABLET | Freq: Four times a day (QID) | ORAL | Status: DC | PRN

## 2016-10-15 MED ORDER — CHLORHEXIDINE GLUCONATE 4 % EX LIQD: 60.0000 mL | Freq: Once | CUTANEOUS | Status: DC

## 2016-10-15 MED ORDER — MIDAZOLAM HCL 5 MG/5ML IJ SOLN
INTRAMUSCULAR | Status: DC | PRN
  Administered 2016-10-15: 2 mg via INTRAVENOUS

## 2016-10-15 MED ORDER — TRANEXAMIC ACID 1000 MG/10ML IV SOLN: 1000.0000 mg | INTRAVENOUS | Status: DC

## 2016-10-15 MED ORDER — ACETAMINOPHEN 500 MG PO TABS
1000.0000 mg | ORAL_TABLET | Freq: Four times a day (QID) | ORAL | Status: AC
  Administered 2016-10-15 – 2016-10-16 (×4): 1000 mg via ORAL
  Filled 2016-10-15 (×4): qty 2

## 2016-10-15 MED ORDER — TRAMADOL HCL 50 MG PO TABS
50.0000 mg | ORAL_TABLET | Freq: Four times a day (QID) | ORAL | Status: DC | PRN
  Filled 2016-10-15: qty 2

## 2016-10-15 SURGICAL SUPPLY — 34 items
BAG DECANTER FOR FLEXI CONT (MISCELLANEOUS) ×2 IMPLANT
BAG SPEC THK2 15X12 ZIP CLS (MISCELLANEOUS)
BAG ZIPLOCK 12X15 (MISCELLANEOUS) IMPLANT
BLADE SAG 18X100X1.27 (BLADE) ×2 IMPLANT
CAPT HIP TOTAL 2 ×1 IMPLANT
CLOTH BEACON ORANGE TIMEOUT ST (SAFETY) ×2 IMPLANT
COVER PERINEAL POST (MISCELLANEOUS) ×2 IMPLANT
DECANTER SPIKE VIAL GLASS SM (MISCELLANEOUS) ×2 IMPLANT
DRAPE STERI IOBAN 125X83 (DRAPES) ×2 IMPLANT
DRAPE U-SHAPE 47X51 STRL (DRAPES) ×4 IMPLANT
DRSG ADAPTIC 3X8 NADH LF (GAUZE/BANDAGES/DRESSINGS) ×2 IMPLANT
DRSG MEPILEX BORDER 4X4 (GAUZE/BANDAGES/DRESSINGS) ×2 IMPLANT
DRSG MEPILEX BORDER 4X8 (GAUZE/BANDAGES/DRESSINGS) ×2 IMPLANT
DURAPREP 26ML APPLICATOR (WOUND CARE) ×2 IMPLANT
ELECT REM PT RETURN 9FT ADLT (ELECTROSURGICAL) ×2
ELECTRODE REM PT RTRN 9FT ADLT (ELECTROSURGICAL) ×1 IMPLANT
EVACUATOR 1/8 PVC DRAIN (DRAIN) ×2 IMPLANT
GLOVE BIO SURGEON STRL SZ7.5 (GLOVE) ×2 IMPLANT
GLOVE BIO SURGEON STRL SZ8 (GLOVE) ×4 IMPLANT
GLOVE BIOGEL PI IND STRL 8 (GLOVE) ×2 IMPLANT
GLOVE BIOGEL PI INDICATOR 8 (GLOVE) ×2
GOWN STRL REUS W/TWL LRG LVL3 (GOWN DISPOSABLE) ×2 IMPLANT
GOWN STRL REUS W/TWL XL LVL3 (GOWN DISPOSABLE) ×2 IMPLANT
PACK ANTERIOR HIP CUSTOM (KITS) ×2 IMPLANT
STRIP CLOSURE SKIN 1/2X4 (GAUZE/BANDAGES/DRESSINGS) ×2 IMPLANT
SUT ETHIBOND NAB CT1 #1 30IN (SUTURE) ×2 IMPLANT
SUT MNCRL AB 4-0 PS2 18 (SUTURE) ×2 IMPLANT
SUT VIC AB 2-0 CT1 27 (SUTURE) ×6
SUT VIC AB 2-0 CT1 TAPERPNT 27 (SUTURE) ×2 IMPLANT
SUT VLOC 180 0 24IN GS25 (SUTURE) ×2 IMPLANT
SYR 50ML LL SCALE MARK (SYRINGE) IMPLANT
TRAY FOLEY W/METER SILVER 16FR (SET/KITS/TRAYS/PACK) ×2 IMPLANT
WATER STERILE IRR 1000ML POUR (IV SOLUTION) ×2 IMPLANT
YANKAUER SUCT BULB TIP 10FT TU (MISCELLANEOUS) ×2 IMPLANT

## 2016-10-15 NOTE — Transfer of Care (Signed)
Immediate Anesthesia Transfer of Care Note  Patient: Timothy Bentley  Procedure(s) Performed: Procedure(s): LEFT TOTAL HIP ARTHROPLASTY ANTERIOR APPROACH (Left)  Patient Location: PACU  Anesthesia Type:Regional  Level of Consciousness: awake, alert  and oriented  Airway & Oxygen Therapy: Patient Spontanous Breathing and Patient connected to face mask oxygen  Post-op Assessment: Report given to RN and Post -op Vital signs reviewed and stable  Post vital signs: Reviewed and stable  Last Vitals:  Vitals:   10/15/16 1030  BP: (!) 151/92  Pulse: 72  Resp: 16  Temp: 36.5 C    Last Pain:  Vitals:   10/15/16 1030  TempSrc: Oral         Complications: No apparent anesthesia complications

## 2016-10-15 NOTE — H&P (View-Only) (Signed)
Timothy Bentley DOB: [birth year] Single / Language: English / Race: White Male Date of Admission;  10/15/2016 CC:  Left Hip Pain History of Present Illness The patient is a 66 year old male who comes in for a preoperative History and Physical. The patient is scheduled for a left total hip arthroplasty (anterior) to be performed by Dr. Dione Plover. Aluisio, MD at [facility] on 10/15/2016. The patient is a 66 year old male who presented for follow up of their hip. The patient is being followed for their bilateral osteoarthritis. Symptoms reported include: pain, stiffness and difficulty ambulating. The patient feels that they are doing well and report their pain level to be moderate. He states the pain changes with activity. He has more loss of motion especially on the rt. There is some pain in the lt. hip as well. Timothy Bentley is being followed for bilateral hip osteoarthritis. He has stiffness, intermittent pain, worse with weightbearing, but still tolerable and he is able to get along with it at this point. He is just in for his recheck as recommended by Dr. Wynelle Link. His pain is in the anterior groin of both hips, worse on the right than the left. The patient feels that they are doing poorly and report their pain level to be moderate. Current treatment includes: home exercise program (stretching). The following medication has been used for pain control: none. AP and lateral views of the hip show end stage osteoarthritis in both hips with cycstic changes noted in the femoral heads and acetabulum. Large osteophyte formation mostly inferior about the joint. No fractures noted but starting to see some collapse of the femoral heads, left greater than right. He has reached a point where he would like to get the hip fixed. He wants to proceed with the left hip at this time. They have been treated conservatively in the past for the above stated problem and despite conservative measures, they continue to have  progressive pain and severe functional limitations and dysfunction. They have failed non-operative management including home exercise, medications. It is felt that they would benefit from undergoing total joint replacement. Risks and benefits of the procedure have been discussed with the patient and they elect to proceed with surgery. There are no active contraindications to surgery such as ongoing infection or rapidly progressive neurological disease.  Problem List/Past Medical  Primary osteoarthritis of both hips (M16.0)  Osteoarthritis of right hip, unspecified osteoarthritis type (M16.11)  Heart murmur  Chondromalacia, patella (M22.40) [08/15/2002]: Tear, lateral meniscus, knee, current (836.1) [08/15/2002]: Pain in joint, shoulder region (M25.519) [02/02/2009]: Disorder, shoulder region NEC (726.2) [02/02/2009]: Diverticulosis  Kidney Stone  Allergies  No Known Drug Allergies   Family History Heart Disease  Father. First Degree Relatives   Social History  Not under pain contract  No history of drug/alcohol rehab  Number of flights of stairs before winded  greater than 5 Tobacco use  Never smoker. 06/09/2014 Tobacco / smoke exposure  06/09/2014: no Marital status  single Current work status  working full time Children  0 Exercise  Exercises daily; does individual sport, other and gym / [employer] Living situation  live alone Former drinker  06/09/2014: In the past drank beer, wine and hard liquor  Medication History No Current Medications  Past Surgical History  No pertinent past surgical history     Review of Systems General Not Present- Chills, Fatigue, Fever, Memory Loss, Night Sweats, Weight Gain and Weight Loss. Skin Not Present- Eczema, Hives, Itching, Lesions and Rash. HEENT  Not Present- Dentures, Double Vision, Headache, Hearing Loss, Tinnitus and Visual Loss. Respiratory Not Present- Allergies, Chronic Cough, Coughing up blood, Shortness  of breath at rest and Shortness of breath with exertion. Cardiovascular Not Present- Chest Pain, Difficulty Breathing Lying Down, Murmur, Palpitations, Racing/skipping heartbeats and Swelling. Gastrointestinal Not Present- Abdominal Pain, Bloody Stool, Constipation, Diarrhea, Difficulty Swallowing, Heartburn, Jaundice, Loss of appetitie, Nausea and Vomiting. Male Genitourinary Not Present- Blood in Urine, Discharge, Flank Pain, Incontinence, Painful Urination, Urgency, Urinary frequency, Urinary Retention, Urinating at Night and Weak urinary stream. Musculoskeletal Present- Joint Pain. Not Present- Back Pain, Joint Swelling, Morning Stiffness, Muscle Pain, Muscle Weakness and Spasms. Neurological Not Present- Blackout spells, Difficulty with balance, Dizziness, Paralysis, Tremor and Weakness. Psychiatric Not Present- Insomnia.  Vitals  Weight: 160 lb Height: 69in Body Surface Area: 1.88 m Body Mass Index: 23.63 kg/m  Pulse: 68 (Regular)  BP: 142/80 (Sitting, Right Arm, Standard)   Physical Exam  General Mental Status -Alert, cooperative and good historian. General Appearance-pleasant, Not in acute distress. Orientation-Oriented X3. Build & Nutrition-Well nourished and Well developed.  Head and Neck Head-normocephalic, atraumatic . Neck Global Assessment - supple, no bruit auscultated on the right, no bruit auscultated on the left.  Eye Vision-Wears corrective lenses. Pupil - Bilateral-Regular and Round. Motion - Bilateral-EOMI.  Chest and Lung Exam Auscultation Breath sounds - clear at anterior chest wall and clear at posterior chest wall. Adventitious sounds - No Adventitious sounds.  Cardiovascular Auscultation Rhythm - Regular rate and rhythm. Heart Sounds - S1 WNL and S2 WNL. Murmurs & Other Heart Sounds - Auscultation of the heart reveals - No Murmurs.  Abdomen Palpation/Percussion Tenderness - Abdomen is non-tender to palpation. Rigidity  (guarding) - Abdomen is soft. Auscultation Auscultation of the abdomen reveals - Bowel sounds normal.  Male Genitourinary Note: Not done, not pertinent to present illness   Musculoskeletal Note: he has very limited rotation of his hips. He has full extension of the hip. Further flexion to 115 degrees bilaterally. External rotation of about 20 degrees bilaterally and internal rotation to neutral bilaterally with discomfort in the anterior groin on internal rotation. Sensation and circulation are intact.  RADIOGRAPHS X-rays reveal advanced osteoarthritis of both hips with bone on bone superior weightbearing dome findings, cystic degeneration of both acetabular and femoral head, and inferior medial osteophytes.   Assessment & Plan Osteoarthritis of right hip, unspecified osteoarthritis type (M16.11) Osteoarthritis of left hip, unspecified osteoarthritis type (M16.12)  Note:Surgical Plans: Left Total Hip Replacement - Anterior Approach  Disposition: Home  PCP: Dr. Sharlet Salina  IV TXA  Anesthesia Issues: None  Signed electronically by Ok Edwards, III PA-C

## 2016-10-15 NOTE — Op Note (Signed)
OPERATIVE REPORT- TOTAL HIP ARTHROPLASTY   PREOPERATIVE DIAGNOSIS: Osteoarthritis of the Left hip.   POSTOPERATIVE DIAGNOSIS: Osteoarthritis of the Left  hip.   PROCEDURE: Left total hip arthroplasty, anterior approach.   SURGEON: Gaynelle Arabian, MD   ASSISTANT: Nehemiah Massed, PA-C  ANESTHESIA:  Spinal  ESTIMATED BLOOD LOSS:-350 ml   DRAINS: Hemovac x1.   COMPLICATIONS: None   CONDITION: PACU - hemodynamically stable.   BRIEF CLINICAL NOTE: Timothy Bentley is a 66 y.o. male who has advanced end-  stage arthritis of their Left  hip with progressively worsening pain and  dysfunction.The patient has failed nonoperative management and presents for  total hip arthroplasty.   PROCEDURE IN DETAIL: After successful administration of spinal  anesthetic, the traction boots for the [facility] bed were placed on both  feet and the patient was placed onto the [facility] bed, boots placed into the leg  holders. The Left hip was then isolated from the perineum with plastic  drapes and prepped and draped in the usual sterile fashion. ASIS and  greater trochanter were marked and a oblique incision was made, starting  at about 1 cm lateral and 2 cm distal to the ASIS and coursing towards  the anterior cortex of the femur. The skin was cut with a 10 blade  through subcutaneous tissue to the level of the fascia overlying the  tensor fascia lata muscle. The fascia was then incised in line with the  incision at the junction of the anterior third and posterior 2/3rd. The  muscle was teased off the fascia and then the interval between the TFL  and the rectus was developed. The Hohmann retractor was then placed at  the top of the femoral neck over the capsule. The vessels overlying the  capsule were cauterized and the fat on top of the capsule was removed.  A Hohmann retractor was then placed anterior underneath the rectus  femoris to give exposure to the entire anterior capsule. A T-shaped  capsulotomy  was performed. The edges were tagged and the femoral head  was identified.       Osteophytes are removed off the superior acetabulum.  The femoral neck was then cut in situ with an oscillating saw. Traction  was then applied to the left lower extremity utilizing the [facility]  traction. The femoral head was then removed. Retractors were placed  around the acetabulum and then circumferential removal of the labrum was  performed. Osteophytes were also removed. Reaming starts at 47 mm to  medialize and  Increased in 2 mm increments to 51 mm. We reamed in  approximately 40 degrees of abduction, 20 degrees anteversion. A 52 mm  pinnacle acetabular shell was then impacted in anatomic position under  fluoroscopic guidance with excellent purchase. We did not need to place  any additional dome screws. A 32 mm neutral + 4 marathon liner was then  placed into the acetabular shell.       The femoral lift was then placed along the lateral aspect of the femur  just distal to the vastus ridge. The leg was  externally rotated and capsule  was stripped off the inferior aspect of the femoral neck down to the  level of the lesser trochanter, this was done with electrocautery. The femur was lifted after this was performed. The  leg was then placed in an extended and adducted position essentially delivering the femur. We also removed the capsule superiorly and the piriformis from the piriformis fossa to  gain excellent exposure of the  proximal femur. Rongeur was used to remove some cancellous bone to get  into the lateral portion of the proximal femur for placement of the  initial starter reamer. The starter broaches was placed  the starter broach  and was shown to go down the center of the canal. Broaching  with the  Corail system was then performed starting at size 8, coursing  Up to size 12. A size 12 had excellent torsional and rotational  and axial stability. The trial standard offset neck was then placed  with  a 32 + 9 trial head. The hip was then reduced. We confirmed that  the stem was in the canal both on AP and lateral x-rays. It also has excellent sizing. The hip was reduced with outstanding stability through full extension and full external rotation.. AP pelvis was taken and the leg lengths were measured and found to be within 5 mm (Longer than right as the right femoral head is collapsed. Length was necessary for stability). Hip was then dislocated again and the femoral head and neck removed. The femoral broach was removed. Size 12 Corail stem with a standard offset  neck was then impacted into the femur following native anteversion. Has  excellent purchase in the canal. Excellent torsional and rotational and  axial stability. It is confirmed to be in the canal on AP and lateral  fluoroscopic views. The 36 + 9 ceramic head was placed and the hip  reduced with outstanding stability. Again AP pelvis was taken and it  confirmed that the leg lengths were unchanged. The wound was then copiously  irrigated with saline solution and the capsule reattached and repaired  with Ethibond suture. 30 ml of .25% Bupivicaine was  injected into the capsule and into the edge of the tensor fascia lata as well as subcutaneous tissue. The fascia overlying the tensor fascia lata was then closed with a running #1 V-Loc. Subcu was closed with interrupted 2-0 Vicryl and subcuticular running 4-0 Monocryl. Incision was cleaned  and dried. Steri-Strips and a bulky sterile dressing applied. Hemovac  drain was hooked to suction and then the patient was awakened and transported to  recovery in stable condition.        Please note that a surgical assistant was a medical necessity for this procedure to perform it in a safe and expeditious manner. Assistant was necessary to provide appropriate retraction of vital neurovascular structures and to prevent femoral fracture and allow for anatomic placement of the prosthesis.  Gaynelle Arabian, M.D.

## 2016-10-15 NOTE — Interval H&P Note (Signed)
History and Physical Interval Note:  10/15/2016 12:09 PM  Timothy Bentley  has presented today for surgery, with the diagnosis of LEFT HIP OA  The various methods of treatment have been discussed with the patient and family. After consideration of risks, benefits and other options for treatment, the patient has consented to  Procedure(s): LEFT TOTAL HIP ARTHROPLASTY ANTERIOR APPROACH (Left) as a surgical intervention .  The patient's history has been reviewed, patient examined, no change in status, stable for surgery.  I have reviewed the patient's chart and labs.  Questions were answered to the patient's satisfaction.     Gearlean Alf

## 2016-10-15 NOTE — Anesthesia Procedure Notes (Signed)
Spinal  Patient location during procedure: OR Start time: 10/15/2016 1:00 PM End time: 10/15/2016 1:08 PM Staffing Anesthesiologist: Nolon Nations Performed: anesthesiologist  Preanesthetic Checklist Completed: patient identified, site marked, surgical consent, pre-op evaluation, timeout performed, IV checked, risks and benefits discussed and monitors and equipment checked Spinal Block Patient position: sitting Prep: Betadine Patient monitoring: heart rate, continuous pulse ox and blood pressure Approach: left paramedian Location: L3-4 Injection technique: single-shot Needle Needle type: Sprotte  Needle gauge: 24 G Needle length: 9 cm Additional Notes Expiration date of kit checked and confirmed. Patient tolerated procedure well, without complications.

## 2016-10-15 NOTE — Anesthesia Preprocedure Evaluation (Signed)
Anesthesia Evaluation  Patient identified by MRN, date of birth, ID band Patient awake    Reviewed: Allergy & Precautions, NPO status , Patient's Chart, lab work & pertinent test results  Airway Mallampati: II  TM Distance: >3 FB Neck ROM: Full    Dental no notable dental hx.    Pulmonary neg pulmonary ROS,    Pulmonary exam normal breath sounds clear to auscultation       Cardiovascular negative cardio ROS Normal cardiovascular exam+ Valvular Problems/Murmurs  Rhythm:Regular Rate:Normal     Neuro/Psych negative neurological ROS  negative psych ROS   GI/Hepatic negative GI ROS, Neg liver ROS, (+) Hepatitis -  Endo/Other  negative endocrine ROS  Renal/GU Renal diseasenegative Renal ROS     Musculoskeletal  (+) Arthritis , Osteoarthritis,    Abdominal   Peds negative pediatric ROS (+)  Hematology negative hematology ROS (+)   Anesthesia Other Findings   Reproductive/Obstetrics negative OB ROS                             Anesthesia Physical Anesthesia Plan  ASA: II  Anesthesia Plan: Spinal   Post-op Pain Management:    Induction:   Airway Management Planned:   Additional Equipment:   Intra-op Plan:   Post-operative Plan:   Informed Consent: I have reviewed the patients History and Physical, chart, labs and discussed the procedure including the risks, benefits and alternatives for the proposed anesthesia with the patient or authorized representative who has indicated his/her understanding and acceptance.   Dental advisory given  Plan Discussed with: CRNA  Anesthesia Plan Comments:         Anesthesia Quick Evaluation

## 2016-10-15 NOTE — Anesthesia Postprocedure Evaluation (Signed)
Anesthesia Post Note  Patient: Timothy Bentley  Procedure(s) Performed: Procedure(s) (LRB): LEFT TOTAL HIP ARTHROPLASTY ANTERIOR APPROACH (Left)  Patient location during evaluation: PACU Anesthesia Type: Spinal Level of consciousness: awake and alert Pain management: pain level controlled Vital Signs Assessment: post-procedure vital signs reviewed and stable Respiratory status: spontaneous breathing and respiratory function stable Cardiovascular status: blood pressure returned to baseline and stable Postop Assessment: spinal receding Anesthetic complications: no       Last Vitals:  Vitals:   10/15/16 1700 10/15/16 1720  BP: (!) 153/89 (!) 152/87  Pulse: 64 68  Resp: 16 17  Temp: 36.4 C 36.4 C    Last Pain:  Vitals:   10/15/16 1700  TempSrc:   PainSc: 0-No pain                 Nolon Nations

## 2016-10-16 LAB — BASIC METABOLIC PANEL
ANION GAP: 5 (ref 5–15)
BUN: 20 mg/dL (ref 6–20)
CALCIUM: 8.1 mg/dL — AB (ref 8.9–10.3)
CO2: 26 mmol/L (ref 22–32)
Chloride: 100 mmol/L — ABNORMAL LOW (ref 101–111)
Creatinine, Ser: 0.71 mg/dL (ref 0.61–1.24)
Glucose, Bld: 162 mg/dL — ABNORMAL HIGH (ref 65–99)
Potassium: 4 mmol/L (ref 3.5–5.1)
SODIUM: 131 mmol/L — AB (ref 135–145)

## 2016-10-16 LAB — CBC
HCT: 38.1 % — ABNORMAL LOW (ref 39.0–52.0)
Hemoglobin: 13.4 g/dL (ref 13.0–17.0)
MCH: 30 pg (ref 26.0–34.0)
MCHC: 35.2 g/dL (ref 30.0–36.0)
MCV: 85.2 fL (ref 78.0–100.0)
PLATELETS: 142 10*3/uL — AB (ref 150–400)
RBC: 4.47 MIL/uL (ref 4.22–5.81)
RDW: 12.7 % (ref 11.5–15.5)
WBC: 13.9 10*3/uL — AB (ref 4.0–10.5)

## 2016-10-16 MED ORDER — TRAMADOL HCL 50 MG PO TABS
50.0000 mg | ORAL_TABLET | Freq: Four times a day (QID) | ORAL | 0 refills | Status: DC | PRN
Start: 2016-10-16 — End: 2017-07-22

## 2016-10-16 MED ORDER — OXYCODONE HCL 5 MG PO TABS
5.0000 mg | ORAL_TABLET | ORAL | 0 refills | Status: DC | PRN
Start: 2016-10-16 — End: 2017-07-22

## 2016-10-16 MED ORDER — METHOCARBAMOL 500 MG PO TABS: 500.0000 mg | ORAL_TABLET | Freq: Four times a day (QID) | ORAL | 0 refills | Status: DC | PRN

## 2016-10-16 MED ORDER — RIVAROXABAN 10 MG PO TABS
10.0000 mg | ORAL_TABLET | Freq: Every day | ORAL | 0 refills | Status: DC
Start: 2016-10-16 — End: 2017-07-22

## 2016-10-16 NOTE — Evaluation (Signed)
Physical Therapy Evaluation Patient Details Name: Timothy Bentley MRN: [id number] DOB: 31-Dec-1950 Today's Date: 10/16/2016   History of Present Illness  Pt s/p L THR  Clinical Impression  Pt s/p L THR presents with decreased L LE strength/ROM and post op pain limiting functional mobility.  Pt should progress well to dc home with family assist and HHPT follow up.    Follow Up Recommendations Home health PT    Equipment Recommendations  None recommended by PT    Recommendations for Other Services OT consult     Precautions / Restrictions Precautions Precautions: Fall Restrictions Weight Bearing Restrictions: No Other Position/Activity Restrictions: WBAT      Mobility  Bed Mobility Overal bed mobility: Needs Assistance Bed Mobility: Supine to Sit     Supine to sit: Min assist     General bed mobility comments: cues for sequence and use of R LE to self assist  Transfers Overall transfer level: Needs assistance Equipment used: Rolling walker (2 wheeled) Transfers: Sit to/from Stand Sit to Stand: Min assist;Min guard         General transfer comment: cues for LE management and use of UEs to self assist  Ambulation/Gait Ambulation/Gait assistance: Min assist;Min guard Ambulation Distance (Feet): 130 Feet Assistive device: Rolling walker (2 wheeled) Gait Pattern/deviations: Step-to pattern;Step-through pattern;Decreased step length - right;Decreased step length - left;Shuffle;Trunk flexed Gait velocity: decr Gait velocity interpretation: Below normal speed for age/gender General Gait Details: cues for posture, position from RW and initial sequence  Stairs            Wheelchair Mobility    Modified Rankin (Stroke Patients Only)       Balance                                             Pertinent Vitals/Pain Pain Assessment: 0-10 Pain Score: 2  Pain Location: L hip/inner thigh Pain Descriptors / Indicators:  Aching;Sore;Tightness Pain Intervention(s): Limited activity within patient's tolerance;Monitored during session;Premedicated before session;Ice applied    Home Living Family/patient expects to be discharged to:: Private residence Living Arrangements: Alone Available Help at Discharge: Friend(s) Type of Home: House Home Access: Stairs to enter Entrance Stairs-Rails: Psychiatric nurse of Steps: 3 Home Layout: One level Home Equipment: Environmental consultant - 2 wheels;Cane - single point      Prior Function Level of Independence: Independent               Hand Dominance        Extremity/Trunk Assessment   Upper Extremity Assessment Upper Extremity Assessment: Overall WFL for tasks assessed    Lower Extremity Assessment Lower Extremity Assessment: LLE deficits/detail LLE Deficits / Details: Strength at hip 2+/5 with AAROM at hip to 95 flex and 20 abd    Cervical / Trunk Assessment Cervical / Trunk Assessment: Normal  Communication   Communication: No difficulties  Cognition Arousal/Alertness: Awake/alert Behavior During Therapy: WFL for tasks assessed/performed Overall Cognitive Status: Within Functional Limits for tasks assessed                      General Comments      Exercises Total Joint Exercises Ankle Circles/Pumps: AROM;Both;15 reps;Supine Quad Sets: AROM;Both;10 reps;Supine Heel Slides: AAROM;Left;20 reps;Supine Hip ABduction/ADduction: AAROM;Left;15 reps;Supine   Assessment/Plan    PT Assessment Patient needs continued PT services  PT Problem List Decreased strength;Decreased range of motion;Decreased  activity tolerance;Decreased mobility;Decreased knowledge of use of DME;Decreased knowledge of precautions;Pain          PT Treatment Interventions DME instruction;Gait training;Stair training;Functional mobility training;Therapeutic activities;Therapeutic exercise;Patient/family education    PT Goals (Current goals can be found in the  Care Plan section)  Acute Rehab PT Goals Patient Stated Goal: Regain IND PT Goal Formulation: With patient Time For Goal Achievement: 10/18/16 Potential to Achieve Goals: Good    Frequency 7X/week   Barriers to discharge        Co-evaluation               End of Session Equipment Utilized During Treatment: Gait belt Activity Tolerance: Patient tolerated treatment well Patient left: in chair;with call bell/phone within reach Nurse Communication: Mobility status         Time: [id number] PT Time Calculation (min) (ACUTE ONLY): 38 min   Charges:   PT Evaluation $PT Eval Low Complexity: 1 Procedure PT Treatments $Gait Training: 8-22 mins $Therapeutic Exercise: 8-22 mins   PT G Codes:        Timothy Bentley November 01, 2016, 12:25 PM

## 2016-10-16 NOTE — Evaluation (Signed)
Occupational Therapy Evaluation Patient Details Name: Timothy Bentley MRN: [id number] DOB: [birth year] Today's Date: 10/16/2016    History of Present Illness Pt s/p L THR   Clinical Impression   Patient evaluated by Occupational Therapy with no further acute OT needs identified. All education has been completed and the patient has no further questions.Pt demonstrates good safety awareness.   See below for any follow-up Occupational Therapy or equipment needs. OT is signing off. Thank you for this referral.      Follow Up Recommendations  No OT follow up;Supervision - Intermittent    Equipment Recommendations  None recommended by OT    Recommendations for Other Services       Precautions / Restrictions Precautions Precautions: Fall Restrictions Weight Bearing Restrictions: No Other Position/Activity Restrictions: WBAT      Mobility           General bed mobility comments: cues for sequence and use of R LE to self assist  Transfers Overall transfer level: Needs assistance Equipment used: Rolling walker (2 wheeled) Transfers: Sit to/from [employer] Sit to Stand: Min assist;supervision  Stand pivot transfers: Supervision       General transfer comment: cues for LE management and use of UEs to self assist    Balance                                            ADL Overall ADL's : Needs assistance/impaired Eating/Feeding: Independent   Grooming: Wash/dry hands;Wash/dry face;Oral care;Brushing hair;Supervision/safety;Standing   Upper Body Bathing: Set up;Sitting   Lower Body Bathing: Supervison/ safety;Sit to/from stand   Upper Body Dressing : Set up;Sitting   Lower Body Dressing: Supervision/safety;Sit to/from stand   Toilet Transfer: Supervision/safety;Ambulation;RW;Comfort height toilet   Toileting- Clothing Manipulation and Hygiene: Supervision/safety;Sit to/from stand   Tub/ Shower Transfer: Walk-in  shower;Supervision/safety;Ambulation;3 in 1;Rolling walker Tub/Shower Transfer Details (indicate cue type and reason): Pt instructed in use of 3in1 in shower  Functional mobility during ADLs: Supervision/safety;Rolling walker General ADL Comments: Reviewed use of walker bag/basket, removing or securing all area rugs, and safety with ADLs      Vision     Perception     Praxis      Pertinent Vitals/Pain Pain Assessment: 0-10 Pain Score: 2  Pain Location: L hip/inner thigh Pain Descriptors / Indicators: Aching;Sore;Tightness Pain Intervention(s): Limited activity within patient's tolerance;Monitored during session;Premedicated before session;Ice applied     Hand Dominance     Extremity/Trunk Assessment Upper Extremity Assessment Upper Extremity Assessment: Overall WFL for tasks assessed   Lower Extremity Assessment - defer to PT     Cervical / Trunk Assessment Cervical / Trunk Assessment: Normal   Communication Communication Communication: No difficulties   Cognition Arousal/Alertness: Awake/alert Behavior During Therapy: WFL for tasks assessed/performed Overall Cognitive Status: Within Functional Limits for tasks assessed                     General Comments       Exercises      Shoulder Instructions      Home Living Family/patient expects to be discharged to:: Private residence Living Arrangements: Alone Available Help at Discharge: Friend(s) Type of Home: House Home Access: Stairs to enter [employer] of Steps: 3 Entrance Stairs-Rails: Right;Left Home Layout: One level     Bathroom Shower/Tub: Occupational psychologist: Standard  Home Equipment: [place] - 2 wheels;Cane - single point          Prior Functioning/Environment Level of Independence: Independent        Comments: worked full time         OT Problem List: Decreased knowledge of precautions;Decreased knowledge of use of DME or AE   OT  Treatment/Interventions:      OT Goals(Current goals can be found in the care plan section) Acute Rehab OT Goals Patient Stated Goal: Regain IND OT Goal Formulation: All assessment and education complete, DC therapy  OT Frequency:     Barriers to D/C:            Co-evaluation              End of Session Equipment Utilized During Treatment: Rolling walker Nurse Communication: Mobility status  Activity Tolerance: Patient tolerated treatment well Patient left: in chair;with call bell/phone within reach   Time: 1233-1258 OT Time Calculation (min): 25 min Charges:  OT General Charges $OT Visit: 1 Procedure OT Evaluation $OT Eval Low Complexity: 1 Procedure OT Treatments $Self Care/Home Management : 8-22 mins G-Codes:    Bianka Liberati M 11-09-16, 4:00 PM

## 2016-10-16 NOTE — Discharge Instructions (Addendum)
Dr. Gaynelle Arabian Total Joint Specialist [facility] [address], [place], [place] [address] [phone]      ANTERIOR APPROACH TOTAL HIP REPLACEMENT POSTOPERATIVE DIRECTIONS   Hip Rehabilitation, Guidelines Following Surgery  The results of a hip operation are greatly improved after range of motion and muscle strengthening exercises. Follow all safety measures which are given to protect your hip. If any of these exercises cause increased pain or swelling in your joint, decrease the amount until you are comfortable again. Then slowly increase the exercises. Call your caregiver if you have problems or questions.   HOME CARE INSTRUCTIONS  Remove items at home which could result in a fall. This includes throw rugs or furniture in walking pathways.   ICE to the affected hip every three hours for 30 minutes at a time and then as needed for pain and swelling.  Continue to use ice on the hip for pain and swelling from surgery. You may notice swelling that will progress down to the foot and ankle.  This is normal after surgery.  Elevate the leg when you are not up walking on it.    Continue to use the breathing machine which will help keep your temperature down.  It is common for your temperature to cycle up and down following surgery, especially at night when you are not up moving around and exerting yourself.  The breathing machine keeps your lungs expanded and your temperature down.   DIET You may resume your previous home diet once your are discharged from the hospital.  DRESSING / WOUND CARE / SHOWERING You may shower 3 days after surgery, but keep the wounds dry during showering.  You may use an occlusive plastic wrap (Press'n Seal for example), NO SOAKING/SUBMERGING IN THE BATHTUB.  If the bandage gets wet, change with a clean dry gauze.  If the incision gets wet, pat the wound dry with a clean towel. You may start showering once you are discharged home but  do not submerge the incision under water. Just pat the incision dry and apply a dry gauze dressing on daily. Change the surgical dressing daily and reapply a dry dressing each time.  ACTIVITY Walk with your walker as instructed. Use walker as long as suggested by your caregivers. Avoid periods of inactivity such as sitting longer than an hour when not asleep. This helps prevent blood clots.  You may resume a sexual relationship in one month or when given the OK by your doctor.  You may return to work once you are cleared by your doctor.  Do not drive a car for 6 weeks or until released by you surgeon.  Do not drive while taking narcotics.  WEIGHT BEARING Weight bearing as tolerated with assist device (walker, cane, etc) as directed, use it as long as suggested by your surgeon or therapist, typically at least 4-6 weeks.  POSTOPERATIVE CONSTIPATION PROTOCOL Constipation - defined medically as fewer than three stools per week and severe constipation as less than one stool per week.  One of the most common issues patients have following surgery is constipation.  Even if you have a regular bowel pattern at home, your normal regimen is likely to be disrupted due to multiple reasons following surgery.  Combination of anesthesia, postoperative narcotics, change in appetite and fluid intake all can affect your bowels.  In order to avoid complications following surgery, here are some recommendations in order to help you during your recovery period.  Colace (docusate) -  Pick up an over-the-counter form of Colace or another stool softener and take twice a day as long as you are requiring postoperative pain medications.  Take with a full glass of water daily.  If you experience loose stools or diarrhea, hold the colace until you stool forms back up.  If your symptoms do not get better within 1 week or if they get worse, check with your doctor.  Dulcolax (bisacodyl) - Pick up over-the-counter and take as  directed by the product packaging as needed to assist with the movement of your bowels.  Take with a full glass of water.  Use this product as needed if not relieved by Colace only.   MiraLax (polyethylene glycol) - Pick up over-the-counter to have on hand.  MiraLax is a solution that will increase the amount of water in your bowels to assist with bowel movements.  Take as directed and can mix with a glass of water, juice, soda, coffee, or tea.  Take if you go more than two days without a movement. Do not use MiraLax more than once per day. Call your doctor if you are still constipated or irregular after using this medication for 7 days in a row.  If you continue to have problems with postoperative constipation, please contact the office for further assistance and recommendations.  If you experience "the worst abdominal pain ever" or develop nausea or vomiting, please contact the office immediatly for further recommendations for treatment.  ITCHING  If you experience itching with your medications, try taking only a single pain pill, or even half a pain pill at a time.  You can also use Benadryl over the counter for itching or also to help with sleep.   TED HOSE STOCKINGS Wear the elastic stockings on both legs for three weeks following surgery during the day but you may remove then at night for sleeping.  MEDICATIONS See your medication summary on the After Visit Summary that the nursing staff will review with you prior to discharge.  You may have some home medications which will be placed on hold until you complete the course of blood thinner medication.  It is important for you to complete the blood thinner medication as prescribed by your surgeon.  Continue your approved medications as instructed at time of discharge.  PRECAUTIONS If you experience chest pain or shortness of breath - call 911 immediately for transfer to the hospital emergency department.  If you develop a fever greater that  101 F, purulent drainage from wound, increased redness or drainage from wound, foul odor from the wound/dressing, or calf pain - CONTACT YOUR SURGEON.                                                   FOLLOW-UP APPOINTMENTS Make sure you keep all of your appointments after your operation with your surgeon and caregivers. You should call the office at the above phone number and make an appointment for approximately two weeks after the date of your surgery or on the date instructed by your surgeon outlined in the "After Visit Summary".  RANGE OF MOTION AND STRENGTHENING EXERCISES  These exercises are designed to help you keep full movement of your hip joint. Follow your caregiver's or physical therapist's instructions. Perform all exercises about fifteen times, three times per day or as directed. Exercise both  hips, even if you have had only one joint replacement. These exercises can be done on a training (exercise) mat, on the floor, on a table or on a bed. Use whatever works the best and is most comfortable for you. Use music or television while you are exercising so that the exercises are a pleasant break in your day. This will make your life better with the exercises acting as a break in routine you can look forward to.  Lying on your back, slowly slide your foot toward your buttocks, raising your knee up off the floor. Then slowly slide your foot back down until your leg is straight again.  Lying on your back spread your legs as far apart as you can without causing discomfort.  Lying on your side, raise your upper leg and foot straight up from the floor as far as is comfortable. Slowly lower the leg and repeat.  Lying on your back, tighten up the muscle in the front of your thigh (quadriceps muscles). You can do this by keeping your leg straight and trying to raise your heel off the floor. This helps strengthen the largest muscle supporting your knee.  Lying on your back, tighten up the muscles of your  buttocks both with the legs straight and with the knee bent at a comfortable angle while keeping your heel on the floor.   IF YOU ARE TRANSFERRED TO A SKILLED REHAB FACILITY If the patient is transferred to a skilled rehab facility following release from the hospital, a list of the current medications will be sent to the facility for the patient to continue.  When discharged from the skilled rehab facility, please have the facility set up the patient's [place] prior to being released. Also, the skilled facility will be responsible for providing the patient with their medications at time of release from the facility to include their pain medication, the muscle relaxants, and their blood thinner medication. If the patient is still at the rehab facility at time of the two week follow up appointment, the skilled rehab facility will also need to assist the patient in arranging follow up appointment in our office and any transportation needs.  MAKE SURE YOU:  Understand these instructions.  Get help right away if you are not doing well or get worse.    Pick up stool softner and laxative for home use following surgery while on pain medications. Do not submerge incision under water. Please use good hand washing techniques while changing dressing each day. May shower starting three days after surgery. Please use a clean towel to pat the incision dry following showers. Continue to use ice for pain and swelling after surgery. Do not use any lotions or creams on the incision until instructed by your surgeon.  Take Xarelto for two and a half more weeks, then discontinue Xarelto. Once the patient has completed the blood thinner regimen, then take a Baby 81 mg Aspirin daily for three more weeks.  Information on my medicine - XARELTO (Rivaroxaban)  This medication education was reviewed with me or my healthcare representative as part of my discharge preparation.  The pharmacist that  spoke with me during my hospital stay was:  Modena Morrow, PharmD Candidate  Why was Xarelto prescribed for you? Xarelto was prescribed for you to reduce the risk of blood clots forming after orthopedic surgery. The medical term for these abnormal blood clots is venous thromboembolism (VTE).  What do you need to know about xarelto ?  Take your Xarelto ONCE DAILY at the same time every day. You may take it either with or without food.  If you have difficulty swallowing the tablet whole, you may crush it and mix in applesauce just prior to taking your dose.  Take Xarelto exactly as prescribed by your doctor and DO NOT stop taking Xarelto without talking to the doctor who prescribed the medication.  Stopping without other VTE prevention medication to take the place of Xarelto may increase your risk of developing a clot.  After discharge, you should have regular check-up appointments with your healthcare provider that is prescribing your Xarelto.    What do you do if you miss a dose? If you miss a dose, take it as soon as you remember on the same day then continue your regularly scheduled once daily regimen the next day. Do not take two doses of Xarelto on the same day.   Important Safety Information A possible side effect of Xarelto is bleeding. You should call your healthcare provider right away if you experience any of the following: ? Bleeding from an injury or your nose that does not stop. ? Unusual colored urine (red or dark brown) or unusual colored stools (red or black). ? Unusual bruising for unknown reasons. ? A serious fall or if you hit your head (even if there is no bleeding).  Some medicines may interact with Xarelto and might increase your risk of bleeding while on Xarelto. To help avoid this, consult your healthcare provider or pharmacist prior to using any new prescription or non-prescription medications, including herbals, vitamins, non-steroidal anti-inflammatory  drugs (NSAIDs) and supplements.  This website has more information on Xarelto: [email or web address]

## 2016-10-16 NOTE — Discharge Summary (Signed)
Physician Discharge Summary   Patient ID: Timothy Bentley MRN: [id number] DOB/AGE: [birth year] 66 y.o.  Admit date: 10/15/2016 Discharge date: 10-16-2016  Primary Diagnosis:  Osteoarthritis of the Left hip.  Admission Diagnoses:  Past Medical History:  Diagnosis Date  . Anal condyloma   . Arthritis    both hips  . Diverticulosis    Dr Collene Mares  . Heart murmur   . Hepatitis C    Genotype 23 in 2000; seen @ [facility], treatment declined  . History of kidney stones   . Nephrolithiasis 2002   X 1  . Thrombocytopenia ([place]) 2010   Platelet count 99,000   Discharge Diagnoses:   Principal Problem:   OA (osteoarthritis) of hip  Estimated body mass index is 23.48 kg/m as calculated from the following:   Height as of this encounter: 5' 9"  (1.753 m).   Weight as of this encounter: 72.1 kg (159 lb).  Procedure(s) (LRB): LEFT TOTAL HIP ARTHROPLASTY ANTERIOR APPROACH (Left)   Consults: None  HPI: Timothy Bentley is a 66 y.o. male who has advanced end-  stage arthritis of their Left  hip with progressively worsening pain and  dysfunction.The patient has failed nonoperative management and presents for  total hip arthroplasty.   Laboratory Data: Admission on 10/15/2016  Component Date Value Ref Range Status  . WBC 10/16/2016 13.9* 4.0 - 10.5 K/uL Final  . RBC 10/16/2016 4.47  4.22 - 5.81 MIL/uL Final  . Hemoglobin 10/16/2016 13.4  13.0 - 17.0 g/dL Final  . HCT 10/16/2016 38.1* 39.0 - 52.0 % Final  . MCV 10/16/2016 85.2  78.0 - 100.0 fL Final  . MCH 10/16/2016 30.0  26.0 - 34.0 pg Final  . MCHC 10/16/2016 35.2  30.0 - 36.0 g/dL Final  . RDW 10/16/2016 12.7  11.5 - 15.5 % Final  . Platelets 10/16/2016 142* 150 - 400 K/uL Final  . Sodium 10/16/2016 131* 135 - 145 mmol/L Final  . Potassium 10/16/2016 4.0  3.5 - 5.1 mmol/L Final  . Chloride 10/16/2016 100* 101 - 111 mmol/L Final  . CO2 10/16/2016 26  22 - 32 mmol/L Final  . Glucose, Bld 10/16/2016 162* 65 - 99 mg/dL Final  . BUN  10/16/2016 20  6 - 20 mg/dL Final  . Creatinine, Ser 10/16/2016 0.71  0.61 - 1.24 mg/dL Final  . Calcium 10/16/2016 8.1* 8.9 - 10.3 mg/dL Final  . GFR calc non Af Amer 10/16/2016 >60  >60 mL/min Final  . GFR calc Af Amer 10/16/2016 >60  >60 mL/min Final   Comment: (NOTE) The eGFR has been calculated using the CKD EPI equation. This calculation has not been validated in all clinical situations. eGFR's persistently <60 mL/min signify possible Chronic Kidney Disease.   Georgiann Hahn gap 10/16/2016 5  5 - 15 Final  Hospital Outpatient Visit on 10/10/2016  Component Date Value Ref Range Status  . MRSA, PCR 10/10/2016 NEGATIVE  NEGATIVE Final  . Staphylococcus aureus 10/10/2016 POSITIVE* NEGATIVE Final   Comment:        The Xpert SA Assay (FDA approved for NASAL specimens in patients over 105 years of age), is one component of a comprehensive surveillance program.  Test performance has been validated by [facility] for patients greater than or equal to 23 year old. It is not intended to diagnose infection nor to guide or monitor treatment.   Marland Kitchen aPTT 10/10/2016 31  24 - 36 seconds Final  . WBC 10/10/2016 5.7  4.0 - 10.5 K/uL Final  .  RBC 10/10/2016 5.27  4.22 - 5.81 MIL/uL Final  . Hemoglobin 10/10/2016 16.2  13.0 - 17.0 g/dL Final  . HCT 10/10/2016 46.0  39.0 - 52.0 % Final  . MCV 10/10/2016 87.3  78.0 - 100.0 fL Final  . MCH 10/10/2016 30.7  26.0 - 34.0 pg Final  . MCHC 10/10/2016 35.2  30.0 - 36.0 g/dL Final  . RDW 10/10/2016 13.0  11.5 - 15.5 % Final  . Platelets 10/10/2016 159  150 - 400 K/uL Final  . Sodium 10/10/2016 138  135 - 145 mmol/L Final  . Potassium 10/10/2016 4.0  3.5 - 5.1 mmol/L Final  . Chloride 10/10/2016 101  101 - 111 mmol/L Final  . CO2 10/10/2016 28  22 - 32 mmol/L Final  . Glucose, Bld 10/10/2016 85  65 - 99 mg/dL Final  . BUN 10/10/2016 22* 6 - 20 mg/dL Final  . Creatinine, Ser 10/10/2016 0.69  0.61 - 1.24 mg/dL Final  . Calcium 10/10/2016 9.6  8.9 - 10.3  mg/dL Final  . Total Protein 10/10/2016 7.2  6.5 - 8.1 g/dL Final  . Albumin 10/10/2016 4.5  3.5 - 5.0 g/dL Final  . AST 10/10/2016 52* 15 - 41 U/L Final  . ALT 10/10/2016 76* 17 - 63 U/L Final  . Alkaline Phosphatase 10/10/2016 68  38 - 126 U/L Final  . Total Bilirubin 10/10/2016 0.8  0.3 - 1.2 mg/dL Final  . GFR calc non Af Amer 10/10/2016 >60  >60 mL/min Final  . GFR calc Af Amer 10/10/2016 >60  >60 mL/min Final   Comment: (NOTE) The eGFR has been calculated using the CKD EPI equation. This calculation has not been validated in all clinical situations. eGFR's persistently <60 mL/min signify possible Chronic Kidney Disease.   . Anion gap 10/10/2016 9  5 - 15 Final  . Prothrombin Time 10/10/2016 13.5  11.4 - 15.2 seconds Final  . INR 10/10/2016 1.02   Final  . ABO/RH(D) 10/15/2016 O POS   Final  . Antibody Screen 10/15/2016 NEG   Final  . Sample Expiration 10/15/2016 10/18/2016   Final  . Extend sample reason 10/15/2016 NO TRANSFUSIONS OR PREGNANCY IN THE PAST 3 MONTHS   Final  . Color, Urine 10/10/2016 YELLOW  YELLOW Final  . APPearance 10/10/2016 CLEAR  CLEAR Final  . Specific Gravity, Urine 10/10/2016 1.017  1.005 - 1.030 Final  . pH 10/10/2016 5.0  5.0 - 8.0 Final  . Glucose, UA 10/10/2016 NEGATIVE  NEGATIVE mg/dL Final  . Hgb urine dipstick 10/10/2016 NEGATIVE  NEGATIVE Final  . Bilirubin Urine 10/10/2016 NEGATIVE  NEGATIVE Final  . Ketones, ur 10/10/2016 NEGATIVE  NEGATIVE mg/dL Final  . Protein, ur 10/10/2016 NEGATIVE  NEGATIVE mg/dL Final  . Nitrite 10/10/2016 NEGATIVE  NEGATIVE Final  . Leukocytes, UA 10/10/2016 NEGATIVE  NEGATIVE Final  . ABO/RH(D) 10/10/2016 O POS   Final  Office Visit on 09/11/2016  Component Date Value Ref Range Status  . Color, UA 09/11/2016 Dark yellow   Final  . Clarity, UA 09/11/2016 clody   Final  . Glucose, UA 09/11/2016 negative   Final  . Bilirubin, UA 09/11/2016 negative   Final  . Ketones, UA 09/11/2016 1+   Final  . Spec Grav, UA  09/11/2016 >=1.030   Final  . Blood, UA 09/11/2016 3+   Final  . pH, UA 09/11/2016 6.0   Final  . Protein, UA 09/11/2016 trace   Final  . Urobilinogen, UA 09/11/2016 negative   Final  . Nitrite,  UA 09/11/2016 negative   Final  . Leukocytes, UA 09/11/2016 Negative  Negative Final  Appointment on 09/11/2016  Component Date Value Ref Range Status  . Organism ID, Bacteria 09/13/2016 NO GROWTH   Final     X-Rays:Dg Abd 1 View  Result Date: 09/22/2016 CLINICAL DATA:  Known left UPJ stone EXAM: ABDOMEN - 1 VIEW COMPARISON:  09/15/2016, 09/12/2016 FINDINGS: Scattered large and small bowel gas is noted. The previously seen left UPJ stone is less well visualized on the current exam. There is a calcification over the L2 vertebral body. No other definitive calcifications are noted. Prostatic calcifications are seen. Significant degenerative changes of lumbar spine and bilateral hip joints are noted. IMPRESSION: Small calcification in the expected region of the known UPJ stone. Electronically Signed   By: Inez Catalina M.D.   On: 09/22/2016 07:43   Dg Pelvis Portable  Result Date: 10/15/2016 CLINICAL DATA:  Post left total hip replacement EXAM: PORTABLE PELVIS 1-2 VIEWS COMPARISON:  None. FINDINGS: Changes of left hip replacement. Normal AP alignment. No hardware or bony complicating feature. Soft tissue drain in place. Advanced degenerative changes in the right hip. IMPRESSION: Left hip replacement.  No hardware complicating feature. Electronically Signed   By: Rolm Baptise M.D.   On: 10/15/2016 15:29   Dg C-arm 1-60 Min-no Report  Result Date: 10/15/2016 There is no Radiologist interpretation  for this exam.  US Abdomen Complete W/elastography  Result Date: 10/02/2016 CLINICAL DATA:  Chronic hepatitis-C. EXAM: ULTRASOUND ABDOMEN ULTRASOUND HEPATIC ELASTOGRAPHY TECHNIQUE: Sonography of the upper abdomen was performed. In addition, ultrasound elastography evaluation of the liver was performed. A region  of interest was placed within the right lobe of the liver. Following application of a compressive sonographic pulse, shear waves were detected in the adjacent hepatic tissue and the shear wave velocity was calculated. Multiple assessments were performed at the selected site. Median shear wave velocity is correlated to a Metavir fibrosis score. COMPARISON:  09/12/2016 unenhanced CT abdomen/ pelvis. FINDINGS: ULTRASOUND ABDOMEN Gallbladder: No gallstones or wall thickening visualized. No sonographic Murphy sign noted by sonographer. Common bile duct: Diameter: 5 mm Liver: Liver parenchymal echogenicity and echotexture are within normal limits. No liver surface irregularity. No liver mass demonstrated. IVC: No abnormality visualized. Pancreas: Visualized portion unremarkable. Spleen: Size and appearance within normal limits. Right Kidney: Length: 10.8 cm. Echogenicity within normal limits. No mass or hydronephrosis visualized. Left Kidney: Length: 11.5 cm. Moderate left hydronephrosis, not appreciably changed since 09/12/2016. Simple 1.8 cm lower left renal cyst. Normal left renal parenchymal echogenicity and thickness. No left renal mass demonstrated. No left renal stones demonstrated. Abdominal aorta: No aneurysm visualized. Other findings: None. ULTRASOUND HEPATIC ELASTOGRAPHY Device: Siemens Helix VTQ Patient position: Left Lateral Decubitus Transducer 6C1 Number of measurements: 10 Hepatic segment:  8 Median velocity:   1.59  m/sec IQR: 0.11 IQR/Median velocity ratio: 0.07 Corresponding Metavir fibrosis score:  F2 + some F3 Risk of fibrosis: Moderate Limitations of exam: None Pertinent findings noted on other imaging exams:  None Please note that abnormal shear wave velocities may also be identified in clinical settings other than with hepatic fibrosis, such as: acute hepatitis, elevated right heart and central venous pressures including use of beta blockers, veno-occlusive disease (Budd-Chiari), infiltrative  processes such as mastocytosis/amyloidosis/infiltrative tumor, extrahepatic cholestasis, in the post-prandial state, and liver transplantation. Correlation with patient history, laboratory data, and clinical condition recommended. IMPRESSION: ULTRASOUND ABDOMEN: 1. Moderate left hydronephrosis, stable since 09/12/2016 CT study, suggesting persistent left obstructive uropathy due to  the left ureteral stone visualized on the 09/12/2016 CT study. 2. Otherwise normal abdominal sonogram. No macroscopic evidence of cirrhosis. No liver mass. No secondary findings of portal hypertension. ULTRASOUND HEPATIC ELASTOGRAPHY: Median hepatic shear wave velocity is calculated at 1.59 m/sec. Corresponding Metavir fibrosis score is  F2 + some F3. Risk of fibrosis is Moderate. Follow-up: Additional testing appropriate. Electronically Signed   By: Ilona Sorrel M.D.   On: 10/02/2016 13:25    EKG: Orders placed or performed in visit on 05/06/12  . EKG 12-Lead     Hospital Course: Patient was admitted to [facility] and taken to the OR and underwent the above state procedure without complications.  Patient tolerated the procedure well and was later transferred to the recovery room and then to the orthopaedic floor for postoperative care.  They were given PO and IV analgesics for pain control following their surgery.  They were given 24 hours of postoperative antibiotics of  Anti-infectives    Start     Dose/Rate Route Frequency Ordered Stop   10/15/16 1930  ceFAZolin (ANCEF) IVPB 2g/100 mL premix     2 g 200 mL/hr over 30 Minutes Intravenous Every 6 hours 10/15/16 1731 10/16/16 0206   10/15/16 1028  ceFAZolin (ANCEF) IVPB 2g/100 mL premix     2 g 200 mL/hr over 30 Minutes Intravenous On call to O.R. 10/15/16 1028 10/15/16 1311     and started on DVT prophylaxis in the form of Xarelto.   PT and OT were ordered for total hip protocol.  The patient was allowed to be WBAT with therapy. Discharge planning was  consulted to help with postop disposition and equipment needs.  Patient had a good night on the evening of surgery.  They started to get up OOB with therapy on day one.  Hemovac drain was pulled without difficulty.   Dressing was checked and was clean and dry.  Patient was seen in rounds by Dr. Wynelle Link on POD 1 and was felt to be ready to go home later that same day.  Discharge home with home health Diet - Regular diet Follow up - in 2 weeks Activity - WBAT Disposition - Home Condition Upon Discharge - Good D/C Meds - See DC Summary DVT Prophylaxis - Xarelto  Discharge Instructions    Call MD / Call 911    Complete by:  As directed    If you experience chest pain or shortness of breath, CALL 911 and be transported to the hospital emergency room.  If you develope a fever above 101 F, pus (white drainage) or increased drainage or redness at the wound, or calf pain, call your surgeon's office.   Change dressing    Complete by:  As directed    You may change your dressing dressing daily with sterile 4 x 4 inch gauze dressing and paper tape.  Do not submerge the incision under water.   Constipation Prevention    Complete by:  As directed    Drink plenty of fluids.  Prune juice may be helpful.  You may use a stool softener, such as Colace (over the counter) 100 mg twice a day.  Use MiraLax (over the counter) for constipation as needed.   Diet general    Complete by:  As directed    Discharge instructions    Complete by:  As directed    Pick up stool softner and laxative for home use following surgery while on pain medications. Do not submerge incision under  water. Please use good hand washing techniques while changing dressing each day. May shower starting three days after surgery. Please use a clean towel to pat the incision dry following showers. Continue to use ice for pain and swelling after surgery. Do not use any lotions or creams on the incision until instructed by your  surgeon.  Wear both TED hose on both legs during the day every day for three weeks, but may have off at night at home.  Postoperative Constipation Protocol  Constipation - defined medically as fewer than three stools per week and severe constipation as less than one stool per week.  One of the most common issues patients have following surgery is constipation.  Even if you have a regular bowel pattern at home, your normal regimen is likely to be disrupted due to multiple reasons following surgery.  Combination of anesthesia, postoperative narcotics, change in appetite and fluid intake all can affect your bowels.  In order to avoid complications following surgery, here are some recommendations in order to help you during your recovery period.  Colace (docusate) - Pick up an over-the-counter form of Colace or another stool softener and take twice a day as long as you are requiring postoperative pain medications.  Take with a full glass of water daily.  If you experience loose stools or diarrhea, hold the colace until you stool forms back up.  If your symptoms do not get better within 1 week or if they get worse, check with your doctor.  Dulcolax (bisacodyl) - Pick up over-the-counter and take as directed by the product packaging as needed to assist with the movement of your bowels.  Take with a full glass of water.  Use this product as needed if not relieved by Colace only.   MiraLax (polyethylene glycol) - Pick up over-the-counter to have on hand.  MiraLax is a solution that will increase the amount of water in your bowels to assist with bowel movements.  Take as directed and can mix with a glass of water, juice, soda, coffee, or tea.  Take if you go more than two days without a movement. Do not use MiraLax more than once per day. Call your doctor if you are still constipated or irregular after using this medication for 7 days in a row.  If you continue to have problems with postoperative  constipation, please contact the office for further assistance and recommendations.  If you experience "the worst abdominal pain ever" or develop nausea or vomiting, please contact the office immediatly for further recommendations for treatment.   Take Xarelto for two and a half more weeks, then discontinue Xarelto. Once the patient has completed the blood thinner regimen, then take a Baby 81 mg Aspirin daily for three more weeks.    Do not sit on low chairs, stoools or toilet seats, as it may be difficult to get up from low surfaces    Complete by:  As directed    Driving restrictions    Complete by:  As directed    No driving until released by the physician.   Increase activity slowly as tolerated    Complete by:  As directed    Lifting restrictions    Complete by:  As directed    No lifting until released by the physician.   Patient may shower    Complete by:  As directed    You may shower without a dressing once there is no drainage.  Do not wash over the wound.  If drainage remains, do not shower until drainage stops.   TED hose    Complete by:  As directed    Use stockings (TED hose) for 3 weeks on both leg(s).  You may remove them at night for sleeping.   Weight bearing as tolerated    Complete by:  As directed    Laterality:  left   Extremity:  Lower     Allergies as of 10/16/2016   No Known Allergies     Medication List    STOP taking these medications   acetaminophen-codeine 300-30 MG tablet Commonly known as:  TYLENOL #3   HYDROcodone-homatropine 5-1.5 MG/5ML syrup Commonly known as:  HYCODAN     TAKE these medications   methocarbamol 500 MG tablet Commonly known as:  ROBAXIN Take 1 tablet (500 mg total) by mouth every 6 (six) hours as needed for muscle spasms.   oxyCODONE 5 MG immediate release tablet Commonly known as:  Oxy IR/ROXICODONE Take 1-2 tablets (5-10 mg total) by mouth every 4 (four) hours as needed for moderate pain or severe pain.    predniSONE 20 MG tablet Commonly known as:  DELTASONE Take 2 tablets (40 mg total) by mouth daily with breakfast.   rivaroxaban 10 MG Tabs tablet Commonly known as:  XARELTO Take 1 tablet (10 mg total) by mouth daily with breakfast. Take Xarelto for two and a half more weeks, then discontinue Xarelto. Once the patient has completed the blood thinner regimen, then take a Baby 81 mg Aspirin daily for three more weeks.   Sofosbuvir-Velpatasvir 400-100 MG Tabs Commonly known as:  EPCLUSA Take 1 tablet by mouth daily.   tamsulosin 0.4 MG Caps capsule Commonly known as:  FLOMAX Take 1 capsule (0.4 mg total) by mouth daily.   traMADol 50 MG tablet Commonly known as:  ULTRAM Take 1-2 tablets (50-100 mg total) by mouth every 6 (six) hours as needed for moderate pain.      Follow-up Information    Gearlean Alf, MD. Schedule an appointment as soon as possible for a visit on 10/28/2016.   Specialty:  Orthopedic Surgery Contact information: [address] [place] [address] [phone]           Signed: Arlee Muslim, PA-C Orthopaedic Surgery 10/16/2016, 7:40 AM

## 2016-10-16 NOTE — Care Management Note (Signed)
Case Management Note  Patient Details  Name: Timothy Bentley MRN: [id number] Date of Birth: Nov 27, 1950  Subjective/Objective:                  Osteoarthritis of the [place] Action/Plan: Discharge planning Expected Discharge Date:  10/16/16               Expected Discharge Plan:  [place]  In-House Referral:     Discharge planning Services  CM Consult  Post Acute Care Choice:  Home Health Choice offered to:  Patient  DME Arranged:  3-N-1 DME Agency:  [place]:  PT [place] Agency:  [facility] ([facility])  Status of Service:  Completed, signed off  If discussed at [employer] of Stay Meetings, dates discussed:    Additional Comments: CM met w pt in room to offer choice of home health agency.  Pt chooses [facility] for [place].  Referral given to [facility] rep, Tim.  Cm notified [place] DME rep, Larene Beach to please deliver a 3n1 to room prior to discharge.  No other CM needs were communicated. Dellie Catholic, RN 10/16/2016, 11:59 AM

## 2016-10-16 NOTE — Progress Notes (Signed)
Physical Therapy Treatment Patient Details Name: Timothy Bentley MRN: [id number] DOB: [birth year] Today's Date: 10/16/2016    History of Present Illness Pt s/p L THR    PT Comments    Pt progressing well with mobility.  Reviewed therex, stairs and car transfers.  Follow Up Recommendations  Home health PT     Equipment Recommendations  None recommended by PT    Recommendations for Other Services OT consult     Precautions / Restrictions Precautions Precautions: Fall Restrictions Weight Bearing Restrictions: No Other Position/Activity Restrictions: WBAT    Mobility  Bed Mobility Overal bed mobility: Needs Assistance Bed Mobility: Supine to Sit;Sit to Supine     Supine to sit: Supervision Sit to supine: Supervision      Transfers Overall transfer level: Needs assistance Equipment used: Rolling walker (2 wheeled) Transfers: Sit to/from Stand Sit to Stand: Supervision Stand pivot transfers: Supervision       General transfer comment: cues for LE management and use of UEs to self assist  Ambulation/Gait Ambulation/Gait assistance: Min guard;Supervision Ambulation Distance (Feet): 500 Feet Assistive device: Rolling walker (2 wheeled) Gait Pattern/deviations: Step-to pattern;Step-through pattern;Decreased step length - right;Decreased step length - left;Shuffle;Trunk flexed Gait velocity: decr Gait velocity interpretation: Below normal speed for age/gender General Gait Details: min cues for posture, position from RW and initial sequence   Stairs Stairs: Yes   Stair Management: One rail Left;Step to pattern;Forwards;With cane Number of Stairs: 8 General stair comments: cues for sequence and foot/cane placement  Wheelchair Mobility    Modified Rankin (Stroke Patients Only)       Balance                                    Cognition Arousal/Alertness: Awake/alert Behavior During Therapy: WFL for tasks assessed/performed Overall Cognitive  Status: Within Functional Limits for tasks assessed                      Exercises Total Joint Exercises Ankle Circles/Pumps: AROM;Both;15 reps;Supine Quad Sets: AROM;Both;10 reps;Supine Heel Slides: AAROM;Left;20 reps;Supine Hip ABduction/ADduction: AAROM;Left;15 reps;Supine    General Comments        Pertinent Vitals/Pain Pain Assessment: 0-10 Pain Score: 2  Pain Location: L hip/inner thigh Pain Descriptors / Indicators: Aching;Sore;Tightness Pain Intervention(s): Limited activity within patient's tolerance;Monitored during session;Premedicated before session;Ice applied    Home Living Family/patient expects to be discharged to:: Private residence Living Arrangements: Alone Available Help at Discharge: Friend(s) Type of Home: House Home Access: Stairs to enter Entrance Stairs-Rails: Right;Left Home Layout: One level Home Equipment: Environmental consultant - 2 wheels;Cane - single point      Prior Function Level of Independence: Independent      Comments: worked full time    PT Goals (current goals can now be found in the care plan section) Acute Rehab PT Goals Patient Stated Goal: Regain IND PT Goal Formulation: With patient Time For Goal Achievement: 10/18/16 Potential to Achieve Goals: Good Progress towards PT goals: Progressing toward goals    Frequency    7X/week      PT Plan Current plan remains appropriate    Co-evaluation             End of Session Equipment Utilized During Treatment: Gait belt Activity Tolerance: Patient tolerated treatment well Patient left: in chair;with call bell/phone within reach     Time: 1331-1410 PT Time Calculation (min) (ACUTE ONLY): 39 min  Charges:  $  Gait Training: 8-22 mins $Therapeutic Exercise: 8-22 mins $Therapeutic Activity: 8-22 mins                    G Codes:      Lakira Ogando 11/03/2016, 4:07 PM

## 2016-10-16 NOTE — Progress Notes (Signed)
   Subjective: 1 Day Post-Op Procedure(s) (LRB): LEFT TOTAL HIP ARTHROPLASTY ANTERIOR APPROACH (Left) Patient reports pain as mild.   Patient seen in rounds by Dr. Wynelle Link. Patient is well, but has had some minor complaints of pain in the hip, requiring pain medications We will start therapy today.  If they do well with therapy and meets all goals, then will allow home later this afternoon following therapy. Plan is to go Home following therapy after hospital stay.  Objective: Vital signs in last 24 hours: Temp:  [97.3 F (36.3 C)-98.2 F (36.8 C)] 97.8 F (36.6 C) (01/11 0615) Pulse Rate:  [60-82] 78 (01/11 0615) Resp:  [11-20] 18 (01/11 0615) BP: (121-157)/(70-92) 157/90 (01/11 0615) SpO2:  [97 %-100 %] 99 % (01/11 0615) Weight:  [72.1 kg (159 lb)] 72.1 kg (159 lb) (01/10 1046)  Intake/Output from previous day:  Intake/Output Summary (Last 24 hours) at 10/16/16 0729 Last data filed at 10/16/16 0700  Gross per 24 hour  Intake          4256.67 ml  Output             3650 ml  Net           606.67 ml    Intake/Output this shift: No intake/output data recorded.  Labs:  Recent Labs  10/16/16 0423  HGB 13.4    Recent Labs  10/16/16 0423  WBC 13.9*  RBC 4.47  HCT 38.1*  PLT 142*    Recent Labs  10/16/16 0423  NA 131*  K 4.0  CL 100*  CO2 26  BUN 20  CREATININE 0.71  GLUCOSE 162*  CALCIUM 8.1*   No results for input(s): LABPT, INR in the last 72 hours.  EXAM General - Patient is Alert, Appropriate and Oriented Extremity - Neurovascular intact Sensation intact distally Dorsiflexion/Plantar flexion intact Dressing - dressing C/D/I Motor Function - intact, moving foot and toes well on exam.  Hemovac pulled without difficulty.  Past Medical History:  Diagnosis Date  . Anal condyloma   . Arthritis    both hips  . Diverticulosis    Dr Collene Mares  . Heart murmur   . Hepatitis C    Genotype 23 in 2000; seen @ [facility], treatment declined  . History  of kidney stones   . Nephrolithiasis 2002   X 1  . Thrombocytopenia ([place]) 2010   Platelet count 99,000    Assessment/Plan: 1 Day Post-Op Procedure(s) (LRB): LEFT TOTAL HIP ARTHROPLASTY ANTERIOR APPROACH (Left) Principal Problem:   OA (osteoarthritis) of hip  Estimated body mass index is 23.48 kg/m as calculated from the following:   Height as of this encounter: 5\' 9"  (1.753 m).   Weight as of this encounter: 72.1 kg (159 lb). Up with therapy Discharge home with home health  DVT Prophylaxis - Xarelto Weight Bearing As Tolerated left Leg Hemovac Pulled Begin Therapy  If meets goals and able to go home: Up with therapy Discharge home with home health Diet - Regular diet Follow up - in 2 weeks Activity - WBAT Disposition - Home Condition Upon Discharge - Good D/C Meds - See DC Summary DVT Prophylaxis - [place], PA-C Orthopaedic Surgery 10/16/2016, 7:29 AM

## 2016-10-17 LAB — HEPATITIS C RNA QUANTITATIVE
HCV Quantitative Log: 6.25 {Log} — ABNORMAL HIGH (ref <1.18)
HCV Quantitative: 1790809 IU/mL — ABNORMAL HIGH (ref <15)

## 2016-10-28 ENCOUNTER — Telehealth: Payer: Self-pay | Admitting: Pharmacy Technician

## 2016-10-28 NOTE — Telephone Encounter (Signed)
Nurse Timothy Bentley stated Timothy Bentley got a call from [place] and wants to start [name] in the spring.  I let the pharmacy know.

## 2016-12-02 ENCOUNTER — Encounter: Payer: Self-pay | Admitting: Internal Medicine

## 2016-12-02 ENCOUNTER — Ambulatory Visit (INDEPENDENT_AMBULATORY_CARE_PROVIDER_SITE_OTHER): Payer: BLUE CROSS/BLUE SHIELD | Admitting: Internal Medicine

## 2016-12-02 VITALS — BP 179/100 | HR 83 | Temp 98.1°F | Ht 70.0 in | Wt 161.0 lb

## 2016-12-02 DIAGNOSIS — B182 Chronic viral hepatitis C: Secondary | ICD-10-CM | POA: Diagnosis not present

## 2016-12-02 DIAGNOSIS — K7400 Hepatic fibrosis, unspecified: Secondary | ICD-10-CM

## 2016-12-02 DIAGNOSIS — D696 Thrombocytopenia, unspecified: Secondary | ICD-10-CM

## 2016-12-02 LAB — COMPLETE METABOLIC PANEL WITH GFR
ALBUMIN: 4.2 g/dL (ref 3.6–5.1)
ALK PHOS: 87 U/L (ref 40–115)
ALT: 62 U/L — AB (ref 9–46)
AST: 49 U/L — AB (ref 10–35)
BILIRUBIN TOTAL: 0.7 mg/dL (ref 0.2–1.2)
BUN: 22 mg/dL (ref 7–25)
CO2: 28 mmol/L (ref 20–31)
CREATININE: 0.7 mg/dL (ref 0.70–1.25)
Calcium: 9.2 mg/dL (ref 8.6–10.3)
Chloride: 105 mmol/L (ref 98–110)
GFR, Est African American: >89 mL/min (ref >=60)
GLUCOSE: 87 mg/dL (ref 65–99)
Potassium: 4.5 mmol/L (ref 3.5–5.3)
SODIUM: 140 mmol/L (ref 135–146)
TOTAL PROTEIN: 6.7 g/dL (ref 6.1–8.1)

## 2016-12-02 LAB — HIV ANTIBODY (ROUTINE TESTING W REFLEX): HIV 1&2 Ab, 4th Generation: NONREACTIVE

## 2016-12-02 NOTE — Progress Notes (Signed)
HPI: Timothy Bentley is a 66 y.o. male who is here to discuss his hep C treatment again.  Lab Results  Component Value Date   HCVGENOTYPE 2 02/14/2015    Allergies: No Known Allergies  Vitals: Temp: 98.1 F (36.7 C) (02/27 1033) Temp Source: Oral (02/27 1033) BP: 179/100 (02/27 1033) Pulse Rate: 83 (02/27 1033)  Past Medical History: Past Medical History:  Diagnosis Date  . Anal condyloma   . Arthritis    both hips  . Diverticulosis    Dr Collene Mares  . Heart murmur   . Hepatitis C    Genotype 23 in 2000; seen @ [facility], treatment declined  . History of kidney stones   . Nephrolithiasis 2002   X 1  . Thrombocytopenia ([place]) 2010   Platelet count 99,000    Social History: Social History   Social History  . Marital status: Single    Spouse name: N/A  . Number of children: N/A  . Years of education: N/A   Occupational History  . [employer]    Social History Main Topics  . Smoking status: Never Smoker  . Smokeless tobacco: Never Used  . Alcohol use No  . Drug use: No     Comment: History-smoked pot   . Sexual activity: Not Asked   Other Topics Concern  . None   Social History Narrative   Regular exercise- yes, tennis,yoga,running, swimming, weights          Labs: No results found for: HIV1RNAQUANT, HIV1RNAVL, CD4TABS, HEPBSAB, HEPBSAG, HCVAB  Lab Results  Component Value Date   HCVGENOTYPE 2 02/14/2015    Hepatitis C RNA quantitative Latest Ref Rng & Units 10/14/2016 02/14/2015 09/05/2014  HCV Quantitative <15 IU/mL 1,790,809(H) [id number]) [id number])  HCV Quantitative Log <1.18 log 10 6.25(H) 6.22(H) 6.88(H)    AST (U/L)  Date Value  10/10/2016 52 (H)  06/20/2016 58 (H)  05/21/2015 45 (H)   ALT (U/L)  Date Value  10/10/2016 76 (H)  06/20/2016 73 (H)  05/21/2015 63 (H)   INR (no units)  Date Value  10/10/2016 1.02  09/17/2007 1.1    CrCl: CrCl cannot be calculated (Patient's most recent lab result is older than the maximum 21 days  allowed.).  Fibrosis Score: F2/3 as assessed by ARFI  Child-Pugh Score: Class A  Previous Treatment Regimen: None  Assessment:  Timothy Bentley was approved for his Epclusa last month but he wanted to wait until his surgery is over. He is a very anxious person. Dr. Linus Salmons pulled me aside to talk to him again about starting meds. I spent a good 20 minutes talking to him about why he should start treatment now or this PA will be expired. He is now convinced that he should start the treatment rather than keep putting it off. Timothy Bentley's pharmacy is going have to fill his Epclusa since he has [place]. We'll f/u with him after 2wks of starting therapy.   Recommendations:  Eplcusa 1 PO qday x 68mo F/u after 2 wks of starting therapy F/u with for cure visit  Autumnrose Yore, Pharm.D., BCPS, AAHIVP Clinical Infectious [facility] 12/02/2016, 1:52 PM

## 2016-12-03 DIAGNOSIS — K7400 Hepatic fibrosis, unspecified: Secondary | ICD-10-CM | POA: Insufficient documentation

## 2016-12-03 LAB — HEPATITIS A ANTIBODY, TOTAL: Hep A Total Ab: REACTIVE — AB

## 2016-12-03 LAB — HEPATITIS B CORE ANTIBODY, TOTAL: HEP B C TOTAL AB: NONREACTIVE

## 2016-12-03 LAB — HEPATITIS B SURFACE ANTIBODY,QUALITATIVE

## 2016-12-03 LAB — HEPATITIS B SURFACE ANTIGEN: Hepatitis B Surface Ag: NEGATIVE

## 2016-12-03 NOTE — Assessment & Plan Note (Signed)
No cirrhosis on ultrasound.  I think this is unrelated to liver disease.

## 2016-12-03 NOTE — Assessment & Plan Note (Signed)
Discussed results with the patient and the need to treat the hepatitis C, avoid alcohol.

## 2016-12-03 NOTE — Assessment & Plan Note (Addendum)
I again discussed treatment and he is actually approved and will call today to start Epclusa 12 weeks.  He will call if his approval had expired.  He will follow up with PharmD in 1-2 weeks, lab in 4 weeks.  Labs today as well including hepatitis B surface Ag.  Has genotype 2.    25 minutes spent including 15 min face to face discussing treatment and benefits, side effects.

## 2016-12-03 NOTE — Progress Notes (Signed)
   Subjective:    Patient ID: Timothy Bentley, male    DOB: Aug 31, 1951, 66 y.o.   MRN: [id number]  HPI Here to discuss treatment again for hepatitis C.  He admits to being very anxious about treatment and bad outcomes with treatment.  He has already been approved for treatment.  Had elastography and is F2/3, no cirrhosis on ultrasound.    Review of Systems  Constitutional: Negative for fatigue.  Gastrointestinal: Negative for diarrhea.  Neurological: Negative for dizziness and headaches.       Objective:   Physical Exam        Assessment & Plan:

## 2016-12-05 ENCOUNTER — Encounter: Payer: Self-pay | Admitting: Pharmacy Technician

## 2016-12-31 ENCOUNTER — Ambulatory Visit (INDEPENDENT_AMBULATORY_CARE_PROVIDER_SITE_OTHER): Payer: BLUE CROSS/BLUE SHIELD | Admitting: Pharmacist

## 2016-12-31 DIAGNOSIS — B182 Chronic viral hepatitis C: Secondary | ICD-10-CM | POA: Diagnosis not present

## 2016-12-31 NOTE — Progress Notes (Signed)
HPI: Timothy Bentley is a 66 y.o. male who presents to the [facility] today for follow-up of his Hep C infection.  He has genotype 2, F2, and started [place] on 12/11/16.   Lab Results  Component Value Date   HCVGENOTYPE 2 02/14/2015    Allergies: No Known Allergies  Past Medical History: Past Medical History:  Diagnosis Date  . Anal condyloma   . Arthritis    both hips  . Diverticulosis    Dr Collene Mares  . Heart murmur   . Hepatitis C    Genotype 23 in 2000; seen @ [facility], treatment declined  . History of kidney stones   . Nephrolithiasis 2002   X 1  . Thrombocytopenia ([place]) 2010   Platelet count 99,000    Social History: Social History   Social History  . Marital status: Single    Spouse name: N/A  . Number of children: N/A  . Years of education: N/A   Occupational History  . [employer]    Social History Main Topics  . Smoking status: Never Smoker  . Smokeless tobacco: Never Used  . Alcohol use No  . Drug use: No     Comment: History-smoked pot   . Sexual activity: Not on file   Other Topics Concern  . Not on file   Social History Narrative   Regular exercise- yes, tennis,yoga,running, swimming, weights          Labs: Hep B S Ab (no units)  Date Value  12/02/2016 INDETER (A)   Hepatitis B Surface Ag (no units)  Date Value  12/02/2016 NEGATIVE    Lab Results  Component Value Date   HCVGENOTYPE 2 02/14/2015    Hepatitis C RNA quantitative Latest Ref Rng & Units 10/14/2016 02/14/2015 09/05/2014  HCV Quantitative <15 IU/mL 1,790,809(H) 0,076,226(J) 3,354,562(B)  HCV Quantitative Log <1.18 log 10 6.25(H) 6.22(H) 6.88(H)    AST (U/L)  Date Value  12/02/2016 49 (H)  10/10/2016 52 (H)  06/20/2016 58 (H)   ALT (U/L)  Date Value  12/02/2016 62 (H)  10/10/2016 76 (H)  06/20/2016 73 (H)   INR (no units)  Date Value  10/10/2016 1.02  09/17/2007 1.1    CrCl: CrCl cannot be calculated (Patient's most recent lab result is older than the  maximum 21 days allowed.).  Fibrosis Score: F2 as assessed by elastography   Previous Treatment Regimen: Tells me he took interferon "back in the day" but did not tolerate and stopped  Assessment: Yostin is here today to follow-up on his Hep C. He started [place] ~1 month ago.  He tells me he is having no issues tolerating the medication.  He was super nervous about starting treatment and states he put it off for years, but he is very excited to be doing so well now and to be 1 month into treatment.  I spent a good bit of time going over Hep C and ways to transmit the disease and his course of treatment.  Also spent time going over his F score and what that means for him.  He tells me he has not missed a single dose and takes it at 8am every morning.  He was asking about his Hep A and Hep B labs.  He was worried that "reactive" on the Hep A antibody meant he had Hep A. I explained to him that means he was vaccinated before and he has antibodies to the virus and is good to go. His Hep  B labs - neg surface antigen and indeterminate surface antibody, non reactive core antibody.  I explained that he was most likely vaccinated before but we would check the antibody again. Will probably restart his vaccine series soon. Will bring him back at EOT. Will make cure visit at EOT.    Plans: - Continue Epclusa x 12 weeks - Hep C VL and Hep B S Ab today - F/u with pharmacy at EOT on 6/6 at 2pm  Cassie L. Kuppelweiser, PharmD, [facility] for Infectious Disease 12/31/2016, 2:41 PM

## 2017-01-01 LAB — HEPATITIS B SURFACE ANTIBODY,QUALITATIVE

## 2017-01-02 LAB — HEPATITIS C RNA QUANTITATIVE
HCV QUANT: NOT DETECTED [IU]/mL
HCV Quantitative Log: <1.18 Log IU/mL

## 2017-01-07 ENCOUNTER — Encounter: Payer: Self-pay | Admitting: Internal Medicine

## 2017-01-07 ENCOUNTER — Ambulatory Visit (INDEPENDENT_AMBULATORY_CARE_PROVIDER_SITE_OTHER): Payer: BLUE CROSS/BLUE SHIELD | Admitting: Pharmacist

## 2017-01-07 ENCOUNTER — Ambulatory Visit: Payer: BLUE CROSS/BLUE SHIELD

## 2017-01-07 DIAGNOSIS — Z23 Encounter for immunization: Secondary | ICD-10-CM

## 2017-01-07 DIAGNOSIS — B182 Chronic viral hepatitis C: Secondary | ICD-10-CM

## 2017-01-07 NOTE — Progress Notes (Signed)
Timothy Bentley just came in to start his Hep B vaccine series.  Administered the 1st one and made an appt for 1 month for his 2nd one.

## 2017-01-14 ENCOUNTER — Other Ambulatory Visit: Payer: Self-pay | Admitting: Internal Medicine

## 2017-01-14 NOTE — Telephone Encounter (Signed)
Does the patient needs refills? Started 12/11/16 per note and picked up today for this month and according to Rx should have 1 more fill. Please advise.

## 2017-01-14 NOTE — Telephone Encounter (Signed)
Lord yes! It looks like [facility] sent in for 28 days and that's it. Haha, I will send in refills to [facility]!

## 2017-02-04 ENCOUNTER — Ambulatory Visit: Payer: BLUE CROSS/BLUE SHIELD | Admitting: Internal Medicine

## 2017-02-11 ENCOUNTER — Ambulatory Visit (INDEPENDENT_AMBULATORY_CARE_PROVIDER_SITE_OTHER): Payer: BLUE CROSS/BLUE SHIELD | Admitting: Pharmacist

## 2017-02-11 DIAGNOSIS — Z23 Encounter for immunization: Secondary | ICD-10-CM

## 2017-02-11 DIAGNOSIS — B182 Chronic viral hepatitis C: Secondary | ICD-10-CM | POA: Diagnosis not present

## 2017-02-11 NOTE — Progress Notes (Signed)
HPI: Timothy Bentley is a 66 y.o. male who presents to the [facility] for his 2nd Hep B shot.   Lab Results  Component Value Date   HCVGENOTYPE 2 02/14/2015    Allergies: No Known Allergies  Past Medical History: Past Medical History:  Diagnosis Date  . Anal condyloma   . Arthritis    both hips  . Diverticulosis    Dr Collene Mares  . Heart murmur   . Hepatitis C    Genotype 23 in 2000; seen @ [facility], treatment declined  . History of kidney stones   . Nephrolithiasis 2002   X 1  . Thrombocytopenia ([place]) 2010   Platelet count 99,000    Social History: Social History   Social History  . Marital status: Single    Spouse name: N/A  . Number of children: N/A  . Years of education: N/A   Occupational History  . [employer]    Social History Main Topics  . Smoking status: Never Smoker  . Smokeless tobacco: Never Used  . Alcohol use No  . Drug use: No     Comment: History-smoked pot   . Sexual activity: Not on file   Other Topics Concern  . Not on file   Social History Narrative   Regular exercise- yes, tennis,yoga,running, swimming, weights          Labs: Hep B S Ab (no units)  Date Value  12/31/2016 INDETER (A)   Hepatitis B Surface Ag (no units)  Date Value  12/02/2016 NEGATIVE    Lab Results  Component Value Date   HCVGENOTYPE 2 02/14/2015    Hepatitis C RNA quantitative Latest Ref Rng & Units 12/31/2016 10/14/2016 02/14/2015 09/05/2014  HCV Quantitative NOT DETECTED IU/mL <15 NOT DETECTED 1,790,809(H) 7,619,509(T) 2,671,245(Y)  HCV Quantitative Log NOT DETECTED Log IU/mL <1.18 NOT DETECTED 6.25(H) 6.22(H) 6.88(H)    AST (U/L)  Date Value  12/02/2016 49 (H)  10/10/2016 52 (H)  06/20/2016 58 (H)   ALT (U/L)  Date Value  12/02/2016 62 (H)  10/10/2016 76 (H)  06/20/2016 73 (H)   INR (no units)  Date Value  10/10/2016 1.02  09/17/2007 1.1    CrCl: CrCl cannot be calculated (Patient's most recent lab result is older than the maximum  21 days allowed.).  Fibrosis Score: F2 as assessed by elastography   Child-Pugh Score: A  Previous Treatment Regimen: Tells me he took interferon "back in the day" but did not tolerate and stopped  Assessment: Timothy Bentley is here today for his 2nd Hep B shot.  He is on Epclusa, just starting his 3rd month. He is doing quite well.  He is really excited about almost being done and how easy it was to take the medication. No side effects and no missed doses.  I gave him his 2nd Hep B shot.  He's coming back next month for EOT visit and Hep C VL.  His early on treatment VL was undetectable. He will need his cure visits and 3rd Hep B vaccine appt made in June when he comes back.    Plans: - 2nd Hep B vaccine - Finish out 3rd month of Epclusa - EOT visit 6/6 at 2pm  Cassie L. Kuppelweiser, PharmD, [facility] for Infectious Disease 02/11/2017, 2:06 PM

## 2017-03-11 ENCOUNTER — Ambulatory Visit (INDEPENDENT_AMBULATORY_CARE_PROVIDER_SITE_OTHER): Payer: BLUE CROSS/BLUE SHIELD | Admitting: Pharmacist

## 2017-03-11 DIAGNOSIS — B182 Chronic viral hepatitis C: Secondary | ICD-10-CM | POA: Diagnosis not present

## 2017-03-11 NOTE — Progress Notes (Signed)
HPI: Timothy Bentley is a 66 y.o. male who presents to the [facility] for EOT Hep C follow-up.  He has genotype 2, F2 fibrosis, and finished 12 weeks of Epclusa at the end of May.   Lab Results  Component Value Date   HCVGENOTYPE 2 02/14/2015    Allergies: No Known Allergies  Past Medical History: Past Medical History:  Diagnosis Date  . Anal condyloma   . Arthritis    both hips  . Diverticulosis    Dr Collene Mares  . Heart murmur   . Hepatitis C    Genotype 23 in 2000; seen @ [facility], treatment declined  . History of kidney stones   . Nephrolithiasis 2002   X 1  . Thrombocytopenia ([place]) 2010   Platelet count 99,000    Social History: Social History   Social History  . Marital status: Single    Spouse name: N/A  . Number of children: N/A  . Years of education: N/A   Occupational History  . [employer]    Social History Main Topics  . Smoking status: Never Smoker  . Smokeless tobacco: Never Used  . Alcohol use No  . Drug use: No     Comment: History-smoked pot   . Sexual activity: Not on file   Other Topics Concern  . Not on file   Social History Narrative   Regular exercise- yes, tennis,yoga,running, swimming, weights          Labs: Hep B S Ab (no units)  Date Value  12/31/2016 INDETER (A)   Hepatitis B Surface Ag (no units)  Date Value  12/02/2016 NEGATIVE    Lab Results  Component Value Date   HCVGENOTYPE 2 02/14/2015    Hepatitis C RNA quantitative Latest Ref Rng & Units 12/31/2016 10/14/2016 02/14/2015 09/05/2014  HCV Quantitative NOT DETECTED IU/mL <15 NOT DETECTED 1,790,809(H) 2,025,427(C) 6,237,628(B)  HCV Quantitative Log NOT DETECTED Log IU/mL <1.18 NOT DETECTED 6.25(H) 6.22(H) 6.88(H)    AST (U/L)  Date Value  12/02/2016 49 (H)  10/10/2016 52 (H)  06/20/2016 58 (H)   ALT (U/L)  Date Value  12/02/2016 62 (H)  10/10/2016 76 (H)  06/20/2016 73 (H)   INR (no units)  Date Value  10/10/2016 1.02  09/17/2007 1.1     CrCl: CrCl cannot be calculated (Patient's most recent lab result is older than the maximum 21 days allowed.).  Fibrosis Score: F2 as assessed by ARFI  Child-Pugh Score: A  Previous Treatment Regimen: Tells me he took interferon "back in the day" but did not tolerate and stopped  Assessment: [name] is here today to follow-up for his Hep C infection.  He completed the entire 12 weeks of Epclusa with no issues. He did not miss any doses and tolerated it very well.  He finished around the end of May. I congratulated him.  His early on treatment viral load was undetectable already.  He will get another Hep C VL done today.  I will make his cure visits with [place].     Plans: - EOT Hep C VL today - SVR12 lab visit 9/26 at 2pm - Cure visit and 3rd Hep B with me 10/3 at 3:30pm  Richetta Cubillos L. Debera Sterba, PharmD, [facility] for Infectious Disease 03/11/2017, 2:16 PM

## 2017-03-13 LAB — HEPATITIS C RNA QUANTITATIVE
HCV QUANT: NOT DETECTED [IU]/mL
HCV Quantitative Log: <1.18 Log IU/mL

## 2017-05-04 ENCOUNTER — Encounter: Payer: Self-pay | Admitting: Internal Medicine

## 2017-06-27 ENCOUNTER — Emergency Department (HOSPITAL_COMMUNITY)
Admission: EM | Admit: 2017-06-27 | Discharge: 2017-06-27 | Disposition: A | Discharge status: Home / Self Care | Payer: BLUE CROSS/BLUE SHIELD | Attending: Emergency Medicine | Admitting: Emergency Medicine

## 2017-06-27 ENCOUNTER — Encounter (HOSPITAL_COMMUNITY): Payer: Self-pay | Admitting: Emergency Medicine

## 2017-06-27 DIAGNOSIS — R109 Unspecified abdominal pain: Secondary | ICD-10-CM | POA: Diagnosis present

## 2017-06-27 DIAGNOSIS — Z5321 Procedure and treatment not carried out due to patient leaving prior to being seen by health care provider: Secondary | ICD-10-CM | POA: Diagnosis not present

## 2017-06-27 NOTE — ED Triage Notes (Signed)
Patient c/o sudden onset of right flank and RLQ pain with N/V this morning. Hx kidney stones. Reports sx mimic previous stones. Denies pain at this time. Reports taking 5mg  Oxycodone.

## 2017-07-01 ENCOUNTER — Other Ambulatory Visit: Payer: BLUE CROSS/BLUE SHIELD

## 2017-07-01 DIAGNOSIS — B182 Chronic viral hepatitis C: Secondary | ICD-10-CM

## 2017-07-01 LAB — COMPLETE METABOLIC PANEL WITH GFR
AG RATIO: 2 (calc) (ref 1.0–2.5)
ALBUMIN MSPROF: 4.1 g/dL (ref 3.6–5.1)
ALT: 14 U/L (ref 9–46)
AST: 17 U/L (ref 10–35)
Alkaline phosphatase (APISO): 65 U/L (ref 40–115)
BUN: 22 mg/dL (ref 7–25)
CALCIUM: 9.1 mg/dL (ref 8.6–10.3)
CO2: 30 mmol/L (ref 20–32)
CREATININE: 0.77 mg/dL (ref 0.70–1.25)
Chloride: 104 mmol/L (ref 98–110)
GFR, EST AFRICAN AMERICAN: 111 mL/min/{1.73_m2} (ref >=60)
GFR, EST NON AFRICAN AMERICAN: 96 mL/min/{1.73_m2} (ref >=60)
GLOBULIN: 2.1 g/dL (ref 1.9–3.7)
Glucose, Bld: 111 mg/dL — ABNORMAL HIGH (ref 65–99)
Potassium: 4 mmol/L (ref 3.5–5.3)
Sodium: 140 mmol/L (ref 135–146)
Total Bilirubin: 0.7 mg/dL (ref 0.2–1.2)
Total Protein: 6.2 g/dL (ref 6.1–8.1)

## 2017-07-03 LAB — HEPATITIS C RNA QUANTITATIVE
HCV Quantitative Log: <1.18 Log IU/mL
HCV RNA, PCR, QN: NOT DETECTED [IU]/mL

## 2017-07-07 ENCOUNTER — Encounter: Payer: Self-pay | Admitting: Internal Medicine

## 2017-07-08 ENCOUNTER — Ambulatory Visit (INDEPENDENT_AMBULATORY_CARE_PROVIDER_SITE_OTHER): Payer: BLUE CROSS/BLUE SHIELD | Admitting: Pharmacist

## 2017-07-08 DIAGNOSIS — B182 Chronic viral hepatitis C: Secondary | ICD-10-CM | POA: Diagnosis not present

## 2017-07-08 DIAGNOSIS — Z23 Encounter for immunization: Secondary | ICD-10-CM | POA: Diagnosis not present

## 2017-07-08 NOTE — Progress Notes (Signed)
HPI: Timothy Bentley is a 66 y.o. male who presents today to the [facility] for his Hep C cure visit.  Lab Results  Component Value Date   HCVGENOTYPE 2 02/14/2015    Allergies: No Known Allergies  Past Medical History: Past Medical History:  Diagnosis Date  . Anal condyloma   . Arthritis    both hips  . Diverticulosis    Dr Collene Mares  . Heart murmur   . Hepatitis C    Genotype 23 in 2000; seen @ [facility], treatment declined  . History of kidney stones   . Nephrolithiasis 2002   X 1  . Thrombocytopenia ([place]) 2010   Platelet count 99,000    Social History: Social History   Social History  . Marital status: Single    Spouse name: N/A  . Number of children: N/A  . Years of education: N/A   Occupational History  . [employer]    Social History Main Topics  . Smoking status: Never Smoker  . Smokeless tobacco: Never Used  . Alcohol use No  . Drug use: No     Comment: History-smoked pot   . Sexual activity: Not on file   Other Topics Concern  . Not on file   Social History Narrative   Regular exercise- yes, tennis,yoga,running, swimming, weights          Labs: Hep B S Ab (no units)  Date Value  12/31/2016 INDETER (A)   Hepatitis B Surface Ag (no units)  Date Value  12/02/2016 NEGATIVE    Lab Results  Component Value Date   HCVGENOTYPE 2 02/14/2015    Hepatitis C RNA quantitative Latest Ref Rng & Units 07/01/2017 03/11/2017 12/31/2016 10/14/2016 02/14/2015  HCV Quantitative NOT DETECTED IU/mL - <15 NOT DETECTED <15 NOT DETECTED 1,790,809(H) 7,062,376(E)  HCV Quantitative Log NOT DETECT Log IU/mL <1.18 NOT DETECTED <1.18 NOT DETECTED <1.18 NOT DETECTED 6.25(H) 6.22(H)    AST (U/L)  Date Value  07/01/2017 17  12/02/2016 49 (H)  10/10/2016 52 (H)   ALT (U/L)  Date Value  07/01/2017 14  12/02/2016 62 (H)  10/10/2016 76 (H)   INR (no units)  Date Value  10/10/2016 1.02  09/17/2007 1.1    CrCl: CrCl cannot be calculated (Unknown ideal  weight.).  Fibrosis Score: F2 as assessed by ARFI   Child-Pugh Score: A  Previous Treatment Regimen: Interferon he thinks  Assessment: Timothy Bentley is here today for his SVR12 Hep C cure visit. He completed 12 weeks of Epclusa with no issues back in May. His cure HCV RNA was undetectable, so he is cured.  I finished his Hep B vaccine series today and also gave him a flu shot.    Plans: - Cured of Hep C - 3rd Hep B vaccine and flu shot today - RTC PRN  Timothy Bentley, PharmD, [facility] for Infectious Disease 07/08/2017, 3:56 PM

## 2017-07-08 NOTE — Addendum Note (Signed)
Addended by: Darletta Moll on: 07/08/2017 03:59 PM   Modules accepted: Orders

## 2017-07-09 ENCOUNTER — Encounter: Payer: Self-pay | Admitting: Internal Medicine

## 2017-07-09 NOTE — Telephone Encounter (Signed)
It really is!

## 2017-07-22 ENCOUNTER — Other Ambulatory Visit (INDEPENDENT_AMBULATORY_CARE_PROVIDER_SITE_OTHER): Payer: BLUE CROSS/BLUE SHIELD

## 2017-07-22 ENCOUNTER — Encounter: Payer: Self-pay | Admitting: Internal Medicine

## 2017-07-22 ENCOUNTER — Ambulatory Visit (INDEPENDENT_AMBULATORY_CARE_PROVIDER_SITE_OTHER): Payer: BLUE CROSS/BLUE SHIELD | Admitting: Internal Medicine

## 2017-07-22 VITALS — BP 150/90 | HR 63 | Temp 97.9°F | Ht 70.0 in | Wt 169.0 lb

## 2017-07-22 DIAGNOSIS — Z0000 Encounter for general adult medical examination without abnormal findings: Secondary | ICD-10-CM

## 2017-07-22 LAB — LIPID PANEL
CHOLESTEROL: 140 mg/dL (ref 0–200)
HDL: 40 mg/dL (ref >39.00)
LDL Cholesterol: 81 mg/dL (ref 0–99)
NONHDL: 100.04
Total CHOL/HDL Ratio: 4
Triglycerides: 97 mg/dL (ref 0.0–149.0)
VLDL: 19.4 mg/dL (ref 0.0–40.0)

## 2017-07-22 NOTE — Progress Notes (Signed)
   Subjective:    Patient ID: Timothy Bentley, male    DOB: 1950-10-08, 66 y.o.   MRN: [id number]  HPI The patient is a 66 YO man coming in for wellness. No new concerns. Treated hep c and hip replacement this year that both went well.   PMH, [facility], social history reviewed and updated.   Review of Systems  Constitutional: Negative.   HENT: Negative.   Eyes: Negative.   Respiratory: Negative for cough, chest tightness and shortness of breath.   Cardiovascular: Negative for chest pain, palpitations and leg swelling.  Gastrointestinal: Negative for abdominal distention, abdominal pain, constipation, diarrhea, nausea and vomiting.  Musculoskeletal: Negative.   Skin: Negative.   Neurological: Negative.   Psychiatric/Behavioral: Negative.       Objective:   Physical Exam  Constitutional: He is oriented to person, place, and time. He appears well-developed and well-nourished.  HENT:  Head: Normocephalic and atraumatic.  Eyes: EOM are normal.  Neck: Normal range of motion.  Cardiovascular: Normal rate and regular rhythm.   Pulmonary/Chest: Effort normal and breath sounds normal. No respiratory distress. He has no wheezes. He has no rales.  Abdominal: Soft. Bowel sounds are normal. He exhibits no distension. There is no tenderness. There is no rebound.  Musculoskeletal: He exhibits no edema.  Neurological: He is alert and oriented to person, place, and time. Coordination normal.  Skin: Skin is warm and dry.  Psychiatric: He has a normal mood and affect.   Vitals:   07/22/17 1100  BP: (!) 150/90  Pulse: 63  Temp: 97.9 F (36.6 C)  TempSrc: Oral  SpO2: 99%  Weight: 169 lb (76.7 kg)  Height: 5\' 10"  (1.778 m)      Assessment & Plan:

## 2017-07-22 NOTE — Assessment & Plan Note (Signed)
Flu shot up to date, tetanus up to date. HIV screening done and just completed hep c treatment. Colonoscopy up to date. Counseled about sun safety and mole surveillance. Given screening recommendations.

## 2017-07-22 NOTE — Patient Instructions (Signed)
We will check the labs today and send you the results on mychart.    Health Maintenance, Male A healthy lifestyle and preventive care is important for your health and wellness. Ask your health care provider about what schedule of regular examinations is right for you. What should I know about weight and diet? Eat a Healthy Diet  Eat plenty of vegetables, fruits, whole grains, low-fat dairy products, and lean protein.  Do not eat a lot of foods high in solid fats, added sugars, or salt.  Maintain a Healthy Weight Regular exercise can help you achieve or maintain a healthy weight. You should:  Do at least 150 minutes of exercise each week. The exercise should increase your heart rate and make you sweat (moderate-intensity exercise).  Do strength-training exercises at least twice a week.  Watch Your Levels of Cholesterol and Blood Lipids  Have your blood tested for lipids and cholesterol every 5 years starting at 64 years of age. If you are at high risk for heart disease, you should start having your blood tested when you are 64 years old. You may need to have your cholesterol levels checked more often if: ? Your lipid or cholesterol levels are high. ? You are older than 64 years of age. ? You are at high risk for heart disease.  What should I know about cancer screening? Many types of cancers can be detected early and may often be prevented. Lung Cancer  You should be screened every year for lung cancer if: ? You are a current smoker who has smoked for at least 30 years. ? You are a former smoker who has quit within the past 15 years.  Talk to your health care provider about your screening options, when you should start screening, and how often you should be screened.  Colorectal Cancer  Routine colorectal cancer screening usually begins at 64 years of age and should be repeated every 5-10 years until you are 64 years old. You may need to be screened more often if early forms of  precancerous polyps or small growths are found. Your health care provider may recommend screening at an earlier age if you have risk factors for colon cancer.  Your health care provider may recommend using home test kits to check for hidden blood in the stool.  A small camera at the end of a tube can be used to examine your colon (sigmoidoscopy or colonoscopy). This checks for the earliest forms of colorectal cancer.  Prostate and Testicular Cancer  Depending on your age and overall health, your health care provider may do certain tests to screen for prostate and testicular cancer.  Talk to your health care provider about any symptoms or concerns you have about testicular or prostate cancer.  Skin Cancer  Check your skin from head to toe regularly.  Tell your health care provider about any new moles or changes in moles, especially if: ? There is a change in a mole's size, shape, or color. ? You have a mole that is larger than a pencil eraser.  Always use sunscreen. Apply sunscreen liberally and repeat throughout the day.  Protect yourself by wearing long sleeves, pants, a wide-brimmed hat, and sunglasses when outside.  What should I know about heart disease, diabetes, and high blood pressure?  If you are 46-45 years of age, have your blood pressure checked every 3-5 years. If you are 67 years of age or older, have your blood pressure checked every year. You should have  your blood pressure measured twice-once when you are at a hospital or clinic, and once when you are not at a hospital or clinic. Record the average of the two measurements. To check your blood pressure when you are not at a hospital or clinic, you can use: ? An automated blood pressure machine at a pharmacy. ? A home blood pressure monitor.  Talk to your health care provider about your target blood pressure.  If you are between 45-79 years old, ask your health care provider if you should take aspirin to prevent heart  disease.  Have regular diabetes screenings by checking your fasting blood sugar level. ? If you are at a normal weight and have a low risk for diabetes, have this test once every three years after the age of 45. ? If you are overweight and have a high risk for diabetes, consider being tested at a younger age or more often.  A one-time screening for abdominal aortic aneurysm (AAA) by ultrasound is recommended for men aged 65-75 years who are current or former smokers. What should I know about preventing infection? Hepatitis B If you have a higher risk for hepatitis B, you should be screened for this virus. Talk with your health care provider to find out if you are at risk for hepatitis B infection. Hepatitis C Blood testing is recommended for:  Everyone born from 1945 through 1965.  Anyone with known risk factors for hepatitis C.  Sexually Transmitted Diseases (STDs)  You should be screened each year for STDs including gonorrhea and chlamydia if: ? You are sexually active and are younger than 64 years of age. ? You are older than 64 years of age and your health care provider tells you that you are at risk for this type of infection. ? Your sexual activity has changed since you were last screened and you are at an increased risk for chlamydia or gonorrhea. Ask your health care provider if you are at risk.  Talk with your health care provider about whether you are at high risk of being infected with HIV. Your health care provider may recommend a prescription medicine to help prevent HIV infection.  What else can I do?  Schedule regular health, dental, and eye exams.  Stay current with your vaccines (immunizations).  Do not use any tobacco products, such as cigarettes, chewing tobacco, and e-cigarettes. If you need help quitting, ask your health care provider.  Limit alcohol intake to no more than 2 drinks per day. One drink equals 12 ounces of beer, 5 ounces of wine, or 1 ounces of  hard liquor.  Do not use street drugs.  Do not share needles.  Ask your health care provider for help if you need support or information about quitting drugs.  Tell your health care provider if you often feel depressed.  Tell your health care provider if you have ever been abused or do not feel safe at home. This information is not intended to replace advice given to you by your health care provider. Make sure you discuss any questions you have with your health care provider. Document Released: 03/20/2008 Document Revised: 05/21/2016 Document Reviewed: 06/26/2015 Elsevier Interactive Patient Education  2018 [employer]  

## 2018-05-11 DIAGNOSIS — M160 Bilateral primary osteoarthritis of hip: Secondary | ICD-10-CM | POA: Diagnosis not present

## 2018-05-11 DIAGNOSIS — Z96643 Presence of artificial hip joint, bilateral: Secondary | ICD-10-CM | POA: Diagnosis not present

## 2018-05-11 DIAGNOSIS — Z471 Aftercare following joint replacement surgery: Secondary | ICD-10-CM | POA: Diagnosis not present

## 2018-06-28 ENCOUNTER — Encounter: Payer: Self-pay | Admitting: Internal Medicine

## 2018-07-19 IMAGING — CT CT RENAL STONE PROTOCOL
2 of 4 series · 16 of 46 positions shown, 18 images · non-contrast
Comparison: Report 08/09/2000 CT

CLINICAL DATA: Left flank pain since [REDACTED] with microscopic
hematuria.

EXAM:
CT ABDOMEN AND PELVIS WITHOUT CONTRAST
TECHNIQUE: Multidetector CT imaging of the abdomen and pelvis was performed
following the standard protocol without IV contrast.

[Series 2: stone study 5.0 i30f 1 · axial · 0.69mm/px · z∈[-486,-80]mm · 13 of 89 slices shown, 15 images]
[im 4/89  soft-tissue]
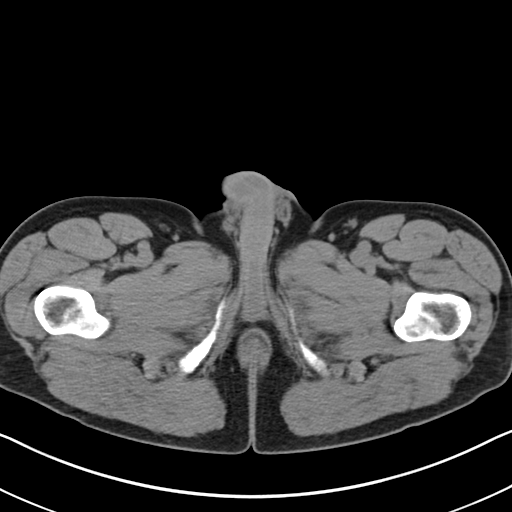
[im 4/89  bone]
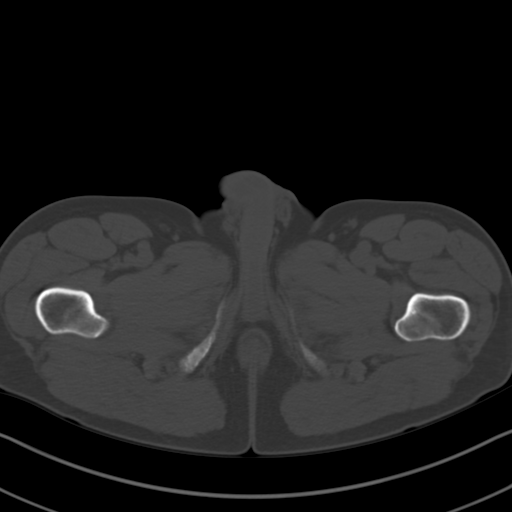
[im 12/89  soft-tissue]
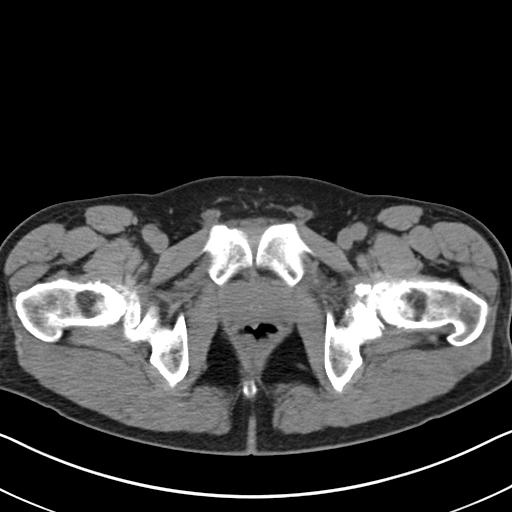
[im 19/89  soft-tissue]
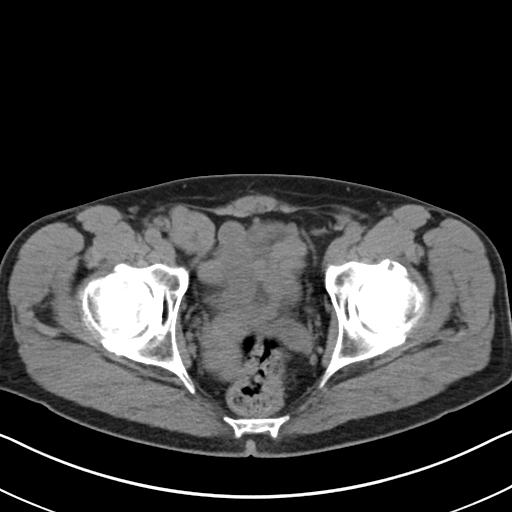
[im 26/89  soft-tissue]
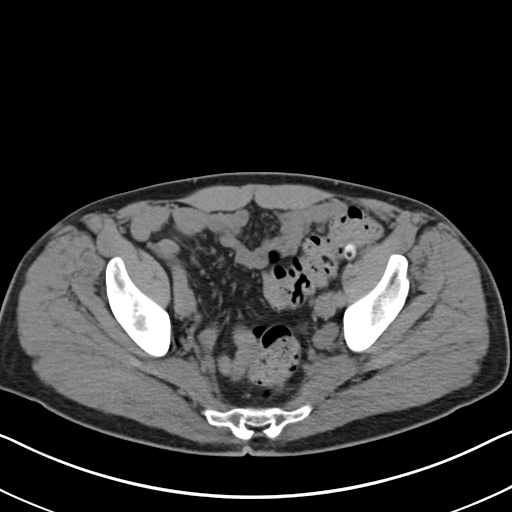
[im 30/89  soft-tissue]
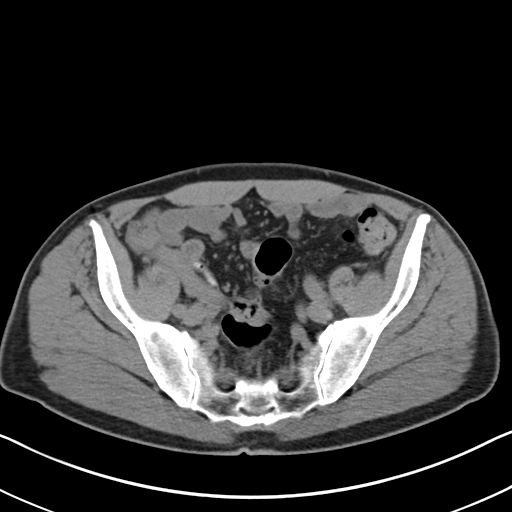
[im 37/89  soft-tissue]
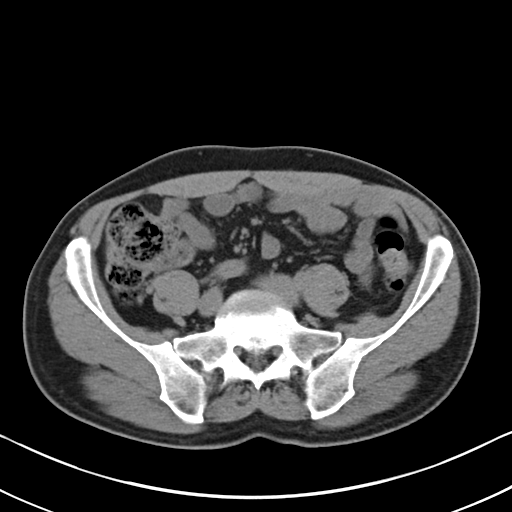
[im 45/89  soft-tissue]
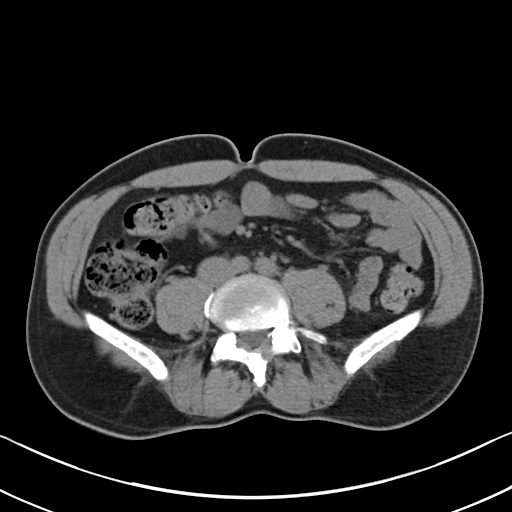
[im 52/89  soft-tissue]
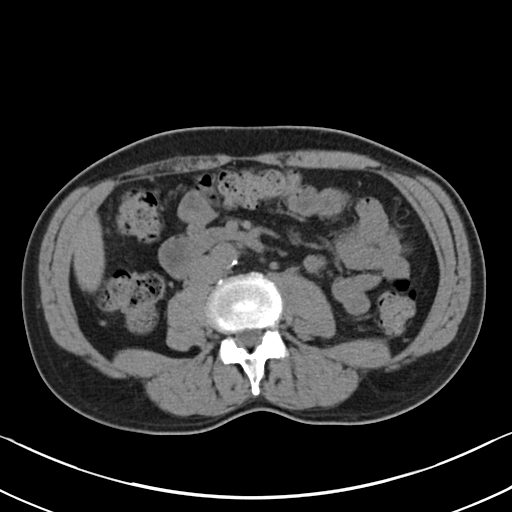
[im 59/89  soft-tissue]
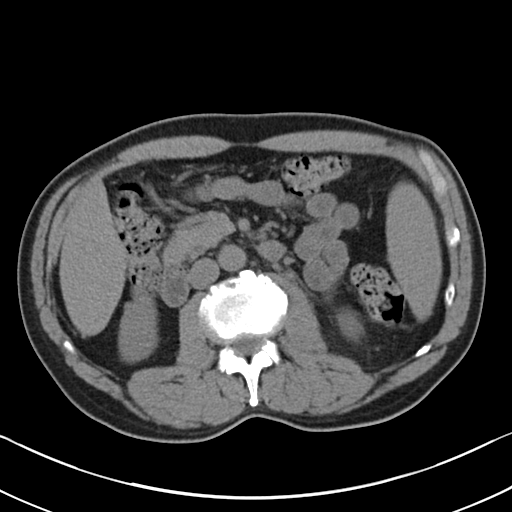
[im 59/89  bone]
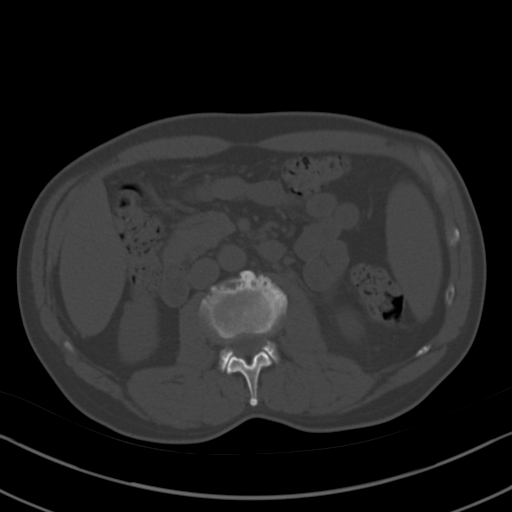
[im 63/89  soft-tissue]
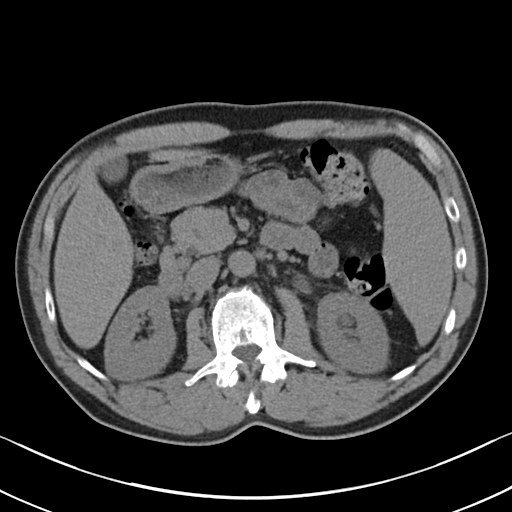
[im 70/89  soft-tissue]
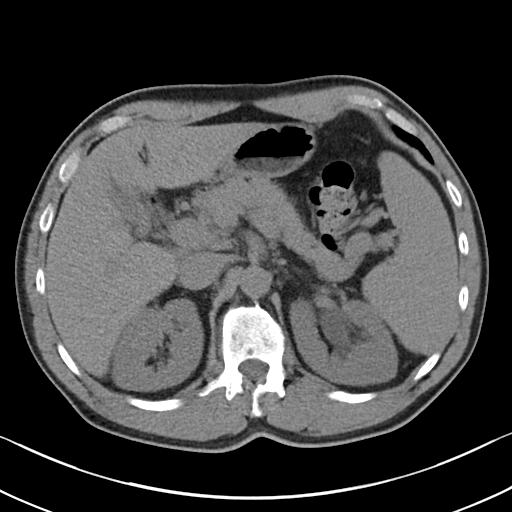
[im 78/89  soft-tissue]
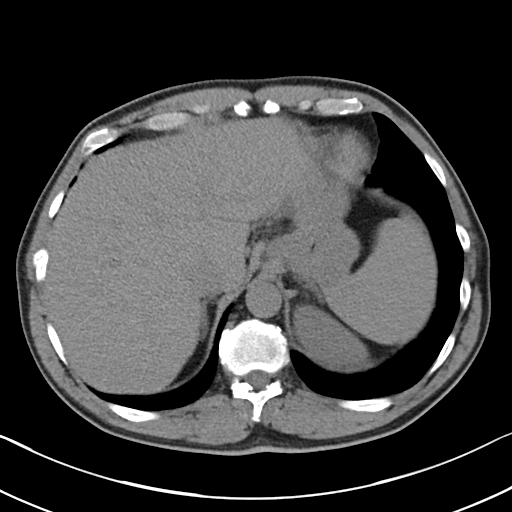
[im 85/89  soft-tissue]
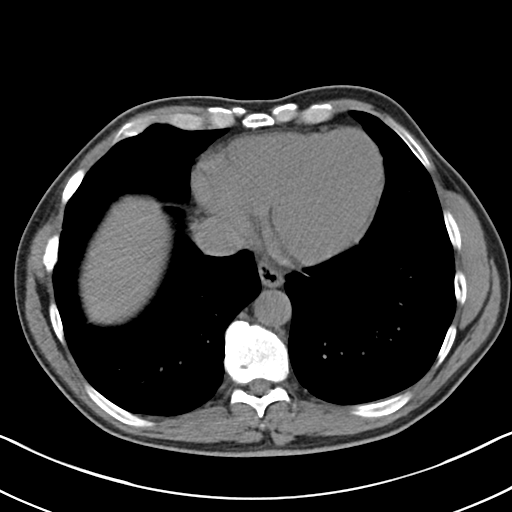

[Series 5: coronal soft tissue · coronal · 0.68mm/px · 3 of 79 slices shown]
[im 27/79  soft-tissue]
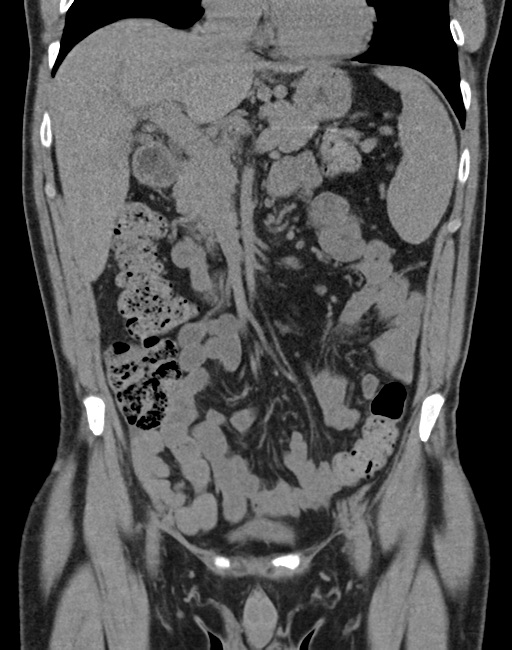
[im 35/79  soft-tissue]
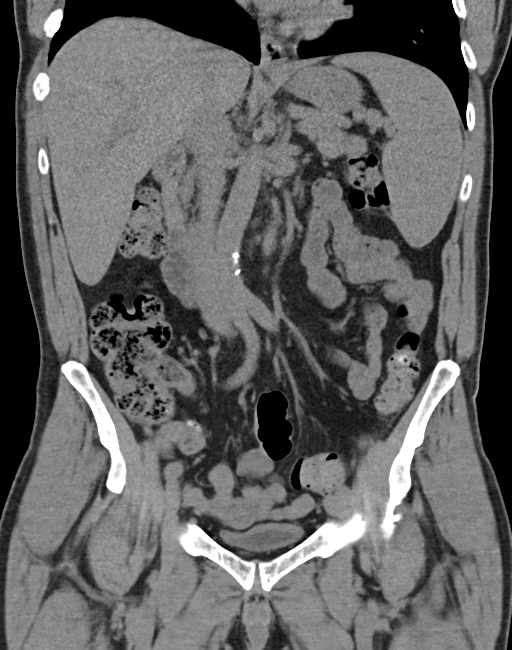
[im 44/79  soft-tissue]
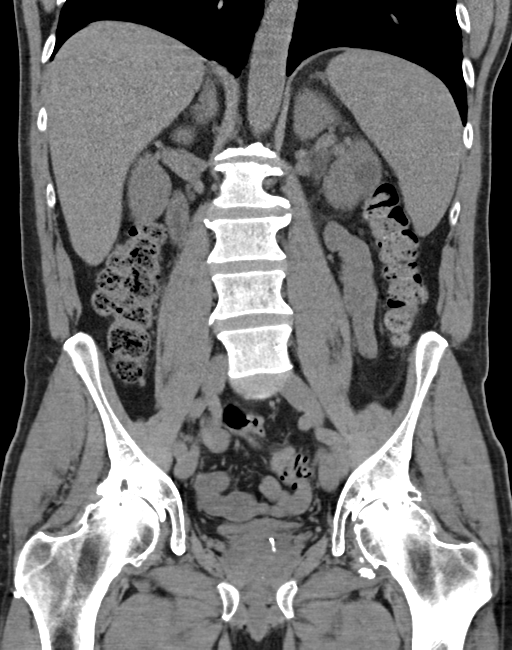

[16 of 46 positions shown; findings below may reference images not displayed]

FINDINGS: LOWER CHEST: Lung bases are clear. Included heart size is normal. No
pericardial effusion.

HEPATOBILIARY: Liver and gallbladder are normal.

PANCREAS: Normal.

SPLEEN: Normal.

ADRENALS/URINARY TRACT: Kidneys are orthotopic. There is a proximal
left ureteral stone causing mild to moderate hydroureteronephrosis
measuring 9 x 7 x 4 mm. No additional renal calculi are noted.
Urinary bladder is partially distended and unremarkable. Normal
adrenal glands.

STOMACH/BOWEL: The stomach, small and large bowel are normal in
course and caliber without inflammatory changes. Normal slightly
opacified appearing appendix. Diverticulosis noted along the sigmoid
colon. Moderate colonic stool burden and retention.

VASCULAR/LYMPHATIC: Minimal aortic atherosclerosis without aneurysm.
No lymphadenopathy by CT size criteria.

REPRODUCTIVE: Normal.

OTHER: No intraperitoneal free fluid or free air.

MUSCULOSKELETAL: Nonacute. L1-2 degenerative disc disease with disc
space narrowing. Inferior endplate Schmorl's node at T10.
IMPRESSION: Proximal left ureteral stone measuring 9 x 7 x 4 mm in craniocaudad
by AP by transverse dimension resulting in mild-to-moderate left
hydroureteronephrosis.

Diverticulosis without acute diverticulitis. Moderate colonic stool
retention.

## 2018-07-27 DIAGNOSIS — Z23 Encounter for immunization: Secondary | ICD-10-CM | POA: Diagnosis not present

## 2018-07-27 DIAGNOSIS — L570 Actinic keratosis: Secondary | ICD-10-CM | POA: Diagnosis not present

## 2018-07-27 DIAGNOSIS — L814 Other melanin hyperpigmentation: Secondary | ICD-10-CM | POA: Diagnosis not present

## 2018-07-27 DIAGNOSIS — D2271 Melanocytic nevi of right lower limb, including hip: Secondary | ICD-10-CM | POA: Diagnosis not present

## 2018-07-27 DIAGNOSIS — L821 Other seborrheic keratosis: Secondary | ICD-10-CM | POA: Diagnosis not present

## 2018-07-27 DIAGNOSIS — D2272 Melanocytic nevi of left lower limb, including hip: Secondary | ICD-10-CM | POA: Diagnosis not present

## 2018-07-27 DIAGNOSIS — D225 Melanocytic nevi of trunk: Secondary | ICD-10-CM | POA: Diagnosis not present

## 2018-07-28 ENCOUNTER — Encounter: Payer: Self-pay | Admitting: Internal Medicine

## 2018-07-28 ENCOUNTER — Ambulatory Visit (INDEPENDENT_AMBULATORY_CARE_PROVIDER_SITE_OTHER): Payer: Medicare Other | Admitting: Internal Medicine

## 2018-07-28 ENCOUNTER — Other Ambulatory Visit (INDEPENDENT_AMBULATORY_CARE_PROVIDER_SITE_OTHER): Payer: Medicare Other

## 2018-07-28 VITALS — BP 142/82 | HR 73 | Temp 97.7°F | Ht 70.0 in | Wt 172.0 lb

## 2018-07-28 DIAGNOSIS — Z23 Encounter for immunization: Secondary | ICD-10-CM

## 2018-07-28 DIAGNOSIS — Z0000 Encounter for general adult medical examination without abnormal findings: Secondary | ICD-10-CM

## 2018-07-28 DIAGNOSIS — B182 Chronic viral hepatitis C: Secondary | ICD-10-CM

## 2018-07-28 DIAGNOSIS — D696 Thrombocytopenia, unspecified: Secondary | ICD-10-CM

## 2018-07-28 LAB — COMPREHENSIVE METABOLIC PANEL
ALT: 18 U/L (ref 0–53)
AST: 18 U/L (ref 0–37)
Albumin: 4.5 g/dL (ref 3.5–5.2)
Alkaline Phosphatase: 62 U/L (ref 39–117)
BILIRUBIN TOTAL: 0.9 mg/dL (ref 0.2–1.2)
BUN: 28 mg/dL — ABNORMAL HIGH (ref 6–23)
CALCIUM: 9 mg/dL (ref 8.4–10.5)
CHLORIDE: 105 meq/L (ref 96–112)
CO2: 29 meq/L (ref 19–32)
Creatinine, Ser: 0.88 mg/dL (ref 0.40–1.50)
GFR: 92.31 mL/min (ref >60.00)
GLUCOSE: 103 mg/dL — AB (ref 70–99)
Potassium: 4.2 mEq/L (ref 3.5–5.1)
Sodium: 140 mEq/L (ref 135–145)
Total Protein: 6.6 g/dL (ref 6.0–8.3)

## 2018-07-28 LAB — CBC
HEMATOCRIT: 47.3 % (ref 39.0–52.0)
Hemoglobin: 16.6 g/dL (ref 13.0–17.0)
MCHC: 35.1 g/dL (ref 30.0–36.0)
MCV: 88.6 fl (ref 78.0–100.0)
Platelets: 119 10*3/uL — ABNORMAL LOW (ref 150.0–400.0)
RBC: 5.34 Mil/uL (ref 4.22–5.81)
RDW: 13 % (ref 11.5–15.5)
WBC: 4.3 10*3/uL (ref 4.0–10.5)

## 2018-07-28 LAB — PSA: PSA: 0.49 ng/mL (ref 0.10–4.00)

## 2018-07-28 LAB — LIPID PANEL
CHOL/HDL RATIO: 3
Cholesterol: 131 mg/dL (ref 0–200)
HDL: 43.7 mg/dL (ref >39.00)
LDL CALC: 77 mg/dL (ref 0–99)
NONHDL: 87.79
TRIGLYCERIDES: 52 mg/dL (ref 0.0–149.0)
VLDL: 10.4 mg/dL (ref 0.0–40.0)

## 2018-07-28 MED ORDER — ZOSTER VAC RECOMB ADJUVANTED 50 MCG/0.5ML IM SUSR
0.5000 mL | Freq: Once | INTRAMUSCULAR | 1 refills | Status: AC
Start: 2018-07-28 — End: 2018-07-28

## 2018-07-28 NOTE — Assessment & Plan Note (Signed)
Has been cured with treatment.

## 2018-07-28 NOTE — Progress Notes (Signed)
   Subjective:    Patient ID: Timothy Bentley, male    DOB: 1951/02/15, 67 y.o.   MRN: [id number]  HPI Here for welcome to medicare wellness, no new complaints. Please see A/P for status and treatment of chronic medical problems.   Diet: heart healthy Physical activity: sedentary Depression/mood screen: negative Hearing: intact to whispered voice Visual acuity: grossly normal, performs annual eye exam  ADLs: capable Fall risk: none Home safety: good Cognitive evaluation: intact to orientation, naming, recall and repetition EOL planning: adv directives discussed, not in place  I have personally reviewed and have noted 1. The patient's medical and social history - reviewed today no changes 2. Their use of alcohol, tobacco or illicit drugs 3. Their current medications and supplements 4. The patient's functional ability including ADL's, fall risks, home safety risks and hearing or visual impairment. 5. Diet and physical activities 6. Evidence for depression or mood disorders 7. Care team reviewed and updated (available in snapshot)  Review of Systems  Constitutional: Negative.   HENT: Negative.   Eyes: Negative.   Respiratory: Negative for cough, chest tightness and shortness of breath.   Cardiovascular: Negative for chest pain, palpitations and leg swelling.  Gastrointestinal: Negative for abdominal distention, abdominal pain, constipation, diarrhea, nausea and vomiting.  Musculoskeletal: Negative.   Skin: Negative.   Neurological: Negative.   Psychiatric/Behavioral: Negative.       Objective:   Physical Exam  Constitutional: He is oriented to person, place, and time. He appears well-developed and well-nourished.  HENT:  Head: Normocephalic and atraumatic.  Eyes: EOM are normal.  Neck: Normal range of motion.  Cardiovascular: Normal rate and regular rhythm.  Pulmonary/Chest: Effort normal and breath sounds normal. No respiratory distress. He has no wheezes. He has no rales.    Abdominal: Soft. Bowel sounds are normal. He exhibits no distension. There is no tenderness. There is no rebound.  Musculoskeletal: He exhibits no edema.  Neurological: He is alert and oriented to person, place, and time. Coordination normal.  Skin: Skin is warm and dry.  Psychiatric: He has a normal mood and affect.   Vitals:   07/28/18 0812  BP: (!) 142/82  Pulse: 73  Temp: 97.7 F (36.5 C)  TempSrc: Oral  SpO2: 96%  Weight: 172 lb (78 kg)  Height: 5\' 10"  (1.778 m)   EKG: Rate 61, sinus, axis normal, interval normal, LVH voltage, no st or t wave changes, no significant change from 2013    Assessment & Plan:  Prevnar 13 given at visit

## 2018-07-28 NOTE — Patient Instructions (Signed)
We have done the EKG which is normal.  We have given you the pneumonia shot today.   We have given you the prescription for the shingles vaccine to take to the pharmacy.    Health Maintenance, Male A healthy lifestyle and preventive care is important for your health and wellness. Ask your health care provider about what schedule of regular examinations is right for you. What should I know about weight and diet? Eat a Healthy Diet  Eat plenty of vegetables, fruits, whole grains, low-fat dairy products, and lean protein.  Do not eat a lot of foods high in solid fats, added sugars, or salt.  Maintain a Healthy Weight Regular exercise can help you achieve or maintain a healthy weight. You should:  Do at least 150 minutes of exercise each week. The exercise should increase your heart rate and make you sweat (moderate-intensity exercise).  Do strength-training exercises at least twice a week.  Watch Your Levels of Cholesterol and Blood Lipids  Have your blood tested for lipids and cholesterol every 5 years starting at 65 years of age. If you are at high risk for heart disease, you should start having your blood tested when you are 65 years old. You may need to have your cholesterol levels checked more often if: ? Your lipid or cholesterol levels are high. ? You are older than 65 years of age. ? You are at high risk for heart disease.  What should I know about cancer screening? Many types of cancers can be detected early and may often be prevented. Lung Cancer  You should be screened every year for lung cancer if: ? You are a current smoker who has smoked for at least 30 years. ? You are a former smoker who has quit within the past 15 years.  Talk to your health care provider about your screening options, when you should start screening, and how often you should be screened.  Colorectal Cancer  Routine colorectal cancer screening usually begins at 65 years of age and should be  repeated every 5-10 years until you are 65 years old. You may need to be screened more often if early forms of precancerous polyps or small growths are found. Your health care provider may recommend screening at an earlier age if you have risk factors for colon cancer.  Your health care provider may recommend using home test kits to check for hidden blood in the stool.  A small camera at the end of a tube can be used to examine your colon (sigmoidoscopy or colonoscopy). This checks for the earliest forms of colorectal cancer.  Prostate and Testicular Cancer  Depending on your age and overall health, your health care provider may do certain tests to screen for prostate and testicular cancer.  Talk to your health care provider about any symptoms or concerns you have about testicular or prostate cancer.  Skin Cancer  Check your skin from head to toe regularly.  Tell your health care provider about any new moles or changes in moles, especially if: ? There is a change in a mole's size, shape, or color. ? You have a mole that is larger than a pencil eraser.  Always use sunscreen. Apply sunscreen liberally and repeat throughout the day.  Protect yourself by wearing long sleeves, pants, a wide-brimmed hat, and sunglasses when outside.  What should I know about heart disease, diabetes, and high blood pressure?  If you are 52-29 years of age, have your blood pressure checked every  3-5 years. If you are 75 years of age or older, have your blood pressure checked every year. You should have your blood pressure measured twice-once when you are at a hospital or clinic, and once when you are not at a hospital or clinic. Record the average of the two measurements. To check your blood pressure when you are not at a hospital or clinic, you can use: ? An automated blood pressure machine at a pharmacy. ? A home blood pressure monitor.  Talk to your health care provider about your target blood  pressure.  If you are between 75-71 years old, ask your health care provider if you should take aspirin to prevent heart disease.  Have regular diabetes screenings by checking your fasting blood sugar level. ? If you are at a normal weight and have a low risk for diabetes, have this test once every three years after the age of 77. ? If you are overweight and have a high risk for diabetes, consider being tested at a younger age or more often.  A one-time screening for abdominal aortic aneurysm (AAA) by ultrasound is recommended for men aged 25-75 years who are current or former smokers. What should I know about preventing infection? Hepatitis B If you have a higher risk for hepatitis B, you should be screened for this virus. Talk with your health care provider to find out if you are at risk for hepatitis B infection. Hepatitis C Blood testing is recommended for:  Everyone born from [birth year] through 1965.  Anyone with known risk factors for hepatitis C.  Sexually Transmitted Diseases (STDs)  You should be screened each year for STDs including gonorrhea and chlamydia if: ? You are sexually active and are younger than 65 years of age. ? You are older than 65 years of age and your health care provider tells you that you are at risk for this type of infection. ? Your sexual activity has changed since you were last screened and you are at an increased risk for chlamydia or gonorrhea. Ask your health care provider if you are at risk.  Talk with your health care provider about whether you are at high risk of being infected with HIV. Your health care provider may recommend a prescription medicine to help prevent HIV infection.  What else can I do?  Schedule regular health, dental, and eye exams.  Stay current with your vaccines (immunizations).  Do not use any tobacco products, such as cigarettes, chewing tobacco, and e-cigarettes. If you need help quitting, ask your health care  provider.  Limit alcohol intake to no more than 2 drinks per day. One drink equals 12 ounces of beer, 5 ounces of wine, or 1 ounces of hard liquor.  Do not use street drugs.  Do not share needles.  Ask your health care provider for help if you need support or information about quitting drugs.  Tell your health care provider if you often feel depressed.  Tell your health care provider if you have ever been abused or do not feel safe at home. This information is not intended to replace advice given to you by your health care provider. Make sure you discuss any questions you have with your health care provider. Document Released: 03/20/2008 Document Revised: 05/21/2016 Document Reviewed: 06/26/2015 Elsevier Interactive Patient Education  [employer].

## 2018-07-28 NOTE — Assessment & Plan Note (Signed)
Checking CBC.  °

## 2018-07-28 NOTE — Assessment & Plan Note (Signed)
Flu shot up to date. Pneumonia given prevnar 13 today. Shingrix rx given. Tetanus up to date. Colonoscopy up to date. Counseled about sun safety and mole surveillance. Counseled about the dangers of distracted driving. Given 10 year screening recommendations.

## 2018-08-11 DIAGNOSIS — M25572 Pain in left ankle and joints of left foot: Secondary | ICD-10-CM | POA: Diagnosis not present

## 2019-08-01 ENCOUNTER — Encounter: Payer: Self-pay | Admitting: Internal Medicine

## 2019-08-01 ENCOUNTER — Ambulatory Visit (INDEPENDENT_AMBULATORY_CARE_PROVIDER_SITE_OTHER): Payer: Medicare Other | Admitting: Internal Medicine

## 2019-08-01 ENCOUNTER — Other Ambulatory Visit: Payer: Self-pay

## 2019-08-01 ENCOUNTER — Other Ambulatory Visit (INDEPENDENT_AMBULATORY_CARE_PROVIDER_SITE_OTHER): Payer: Medicare Other

## 2019-08-01 VITALS — BP 160/100 | HR 66 | Temp 98.4°F | Ht 70.0 in | Wt 172.0 lb

## 2019-08-01 DIAGNOSIS — Z23 Encounter for immunization: Secondary | ICD-10-CM | POA: Diagnosis not present

## 2019-08-01 DIAGNOSIS — K649 Unspecified hemorrhoids: Secondary | ICD-10-CM

## 2019-08-01 DIAGNOSIS — R030 Elevated blood-pressure reading, without diagnosis of hypertension: Secondary | ICD-10-CM

## 2019-08-01 DIAGNOSIS — Z0000 Encounter for general adult medical examination without abnormal findings: Secondary | ICD-10-CM | POA: Diagnosis not present

## 2019-08-01 DIAGNOSIS — B182 Chronic viral hepatitis C: Secondary | ICD-10-CM

## 2019-08-01 LAB — LIPID PANEL
Cholesterol: 151 mg/dL (ref 0–200)
HDL: 46.5 mg/dL (ref >39.00)
LDL Cholesterol: 87 mg/dL (ref 0–99)
NonHDL: 104.83
Total CHOL/HDL Ratio: 3
Triglycerides: 88 mg/dL (ref 0.0–149.0)
VLDL: 17.6 mg/dL (ref 0.0–40.0)

## 2019-08-01 LAB — COMPREHENSIVE METABOLIC PANEL
ALT: 18 U/L (ref 0–53)
AST: 19 U/L (ref 0–37)
Albumin: 4.6 g/dL (ref 3.5–5.2)
Alkaline Phosphatase: 60 U/L (ref 39–117)
BUN: 21 mg/dL (ref 6–23)
CO2: 27 mEq/L (ref 19–32)
Calcium: 9.3 mg/dL (ref 8.4–10.5)
Chloride: 105 mEq/L (ref 96–112)
Creatinine, Ser: 0.83 mg/dL (ref 0.40–1.50)
GFR: 92.63 mL/min (ref >60.00)
Glucose, Bld: 98 mg/dL (ref 70–99)
Potassium: 3.9 mEq/L (ref 3.5–5.1)
Sodium: 139 mEq/L (ref 135–145)
Total Bilirubin: 1 mg/dL (ref 0.2–1.2)
Total Protein: 6.9 g/dL (ref 6.0–8.3)

## 2019-08-01 LAB — CBC
HCT: 47.4 % (ref 39.0–52.0)
Hemoglobin: 16.6 g/dL (ref 13.0–17.0)
MCHC: 35 g/dL (ref 30.0–36.0)
MCV: 88.2 fl (ref 78.0–100.0)
Platelets: 136 10*3/uL — ABNORMAL LOW (ref 150.0–400.0)
RBC: 5.38 Mil/uL (ref 4.22–5.81)
RDW: 13.1 % (ref 11.5–15.5)
WBC: 5.3 10*3/uL (ref 4.0–10.5)

## 2019-08-01 MED ORDER — HYDROCORTISONE (PERIANAL) 2.5 % EX CREA: 1.0000 "application " | TOPICAL_CREAM | Freq: Two times a day (BID) | CUTANEOUS | 0 refills | Status: DC

## 2019-08-01 NOTE — Patient Instructions (Addendum)
We have sent in the cream to use twice a day for 2 weeks.   The goal for low sodium is around 3000 mg   Health Maintenance, Male Adopting a healthy lifestyle and getting preventive care are important in promoting health and wellness. Ask your health care provider about:  The right schedule for you to have regular tests and exams.  Things you can do on your own to prevent diseases and keep yourself healthy. What should I know about diet, weight, and exercise? Eat a healthy diet   Eat a diet that includes plenty of vegetables, fruits, low-fat dairy products, and lean protein.  Do not eat a lot of foods that are high in solid fats, added sugars, or sodium. Maintain a healthy weight Body mass index (BMI) is a measurement that can be used to identify possible weight problems. It estimates body fat based on height and weight. Your health care provider can help determine your BMI and help you achieve or maintain a healthy weight. Get regular exercise Get regular exercise. This is one of the most important things you can do for your health. Most adults should:  Exercise for at least 150 minutes each week. The exercise should increase your heart rate and make you sweat (moderate-intensity exercise).  Do strengthening exercises at least twice a week. This is in addition to the moderate-intensity exercise.  Spend less time sitting. Even light physical activity can be beneficial. Watch cholesterol and blood lipids Have your blood tested for lipids and cholesterol at 66 years of age, then have this test every 5 years. You may need to have your cholesterol levels checked more often if:  Your lipid or cholesterol levels are high.  You are older than 66 years of age.  You are at high risk for heart disease. What should I know about cancer screening? Many types of cancers can be detected early and may often be prevented. Depending on your health history and family history, you may need to have  cancer screening at various ages. This may include screening for:  Colorectal cancer.  Prostate cancer.  Skin cancer.  Lung cancer. What should I know about heart disease, diabetes, and high blood pressure? Blood pressure and heart disease  High blood pressure causes heart disease and increases the risk of stroke. This is more likely to develop in people who have high blood pressure readings, are of African descent, or are overweight.  Talk with your health care provider about your target blood pressure readings.  Have your blood pressure checked: ? Every 3-5 years if you are 33-81 years of age. ? Every year if you are 49 years old or older.  If you are between the ages of 47 and 44 and are a current or former smoker, ask your health care provider if you should have a one-time screening for abdominal aortic aneurysm (AAA). Diabetes Have regular diabetes screenings. This checks your fasting blood sugar level. Have the screening done:  Once every three years after age 2 if you are at a normal weight and have a low risk for diabetes.  More often and at a younger age if you are overweight or have a high risk for diabetes. What should I know about preventing infection? Hepatitis B If you have a higher risk for hepatitis B, you should be screened for this virus. Talk with your health care provider to find out if you are at risk for hepatitis B infection. Hepatitis C Blood testing is recommended for:  Everyone born from [birth year] through 1963.  Anyone with known risk factors for hepatitis C. Sexually transmitted infections (STIs)  You should be screened each year for STIs, including gonorrhea and chlamydia, if: ? You are sexually active and are younger than 66 years of age. ? You are older than 66 years of age and your health care provider tells you that you are at risk for this type of infection. ? Your sexual activity has changed since you were last screened, and you are at increased  risk for chlamydia or gonorrhea. Ask your health care provider if you are at risk.  Ask your health care provider about whether you are at high risk for HIV. Your health care provider may recommend a prescription medicine to help prevent HIV infection. If you choose to take medicine to prevent HIV, you should first get tested for HIV. You should then be tested every 3 months for as long as you are taking the medicine. Follow these instructions at home: Lifestyle  Do not use any products that contain nicotine or tobacco, such as cigarettes, e-cigarettes, and chewing tobacco. If you need help quitting, ask your health care provider.  Do not use street drugs.  Do not share needles.  Ask your health care provider for help if you need support or information about quitting drugs. Alcohol use  Do not drink alcohol if your health care provider tells you not to drink.  If you drink alcohol: ? Limit how much you have to 0-2 drinks a day. ? Be aware of how much alcohol is in your drink. In the [place] one drink equals one 12 oz bottle of beer (355 mL), one 5 oz glass of wine (148 mL), or one 1 oz glass of hard liquor (44 mL). General instructions  Schedule regular health, dental, and eye exams.  Stay current with your vaccines.  Tell your health care provider if: ? You often feel depressed. ? You have ever been abused or do not feel safe at home. Summary  Adopting a healthy lifestyle and getting preventive care are important in promoting health and wellness.  Follow your health care provider's instructions about healthy diet, exercising, and getting tested or screened for diseases.  Follow your health care provider's instructions on monitoring your cholesterol and blood pressure. This information is not intended to replace advice given to you by your health care provider. Make sure you discuss any questions you have with your health care provider. Document Released: 03/20/2008 Document  Revised: 09/15/2018 Document Reviewed: 09/15/2018 Elsevier Patient Education  2020 [employer].

## 2019-08-01 NOTE — Progress Notes (Signed)
Subjective:   Patient ID: Timothy Bentley, male    DOB: June 04, 1951, 68 y.o.   MRN: [id number]  HPI Here for medicare wellness, no new complaints. Please see A/P for status and treatment of chronic medical problems.   HPI #2: Here for follow up hep c (s/p treatment more than 2 years ago, denies current itching, jaundice, pain) and blood pressure (running 130s/80s at home, denies headaches or chest pains, not taking meds), and new hemorrhoid (new in the last year, not itching or burning or bleeding, denies constipation or diarrhea).   Diet: heart healthy Physical activity: sedentary Depression/mood screen: negative Hearing: intact to whispered voice Visual acuity: grossly normal, performs annual eye exam  ADLs: capable Fall risk: none Home safety: good Cognitive evaluation: intact to orientation, naming, recall and repetition EOL planning: adv directives discussed    Office Visit from 08/01/2019 in [facility]  PHQ-2 Total Score  0      I have personally reviewed and have noted 1. The patient's medical and social history - reviewed today no changes 2. Their use of alcohol, tobacco or illicit drugs 3. Their current medications and supplements 4. The patient's functional ability including ADL's, fall risks, home safety risks and hearing or visual impairment. 5. Diet and physical activities 6. Evidence for depression or mood disorders 7. Care team reviewed and updated  Patient Care Team: Hoyt Koch, MD as PCP - General (Internal Medicine) Past Medical History:  Diagnosis Date  . Anal condyloma   . Arthritis    both hips  . Diverticulosis    Dr Collene Mares  . Heart murmur   . Hepatitis C    Genotype 23 in 2000; seen @ [facility], treatment declined  . History of kidney stones   . Nephrolithiasis 2002   X 1  . Thrombocytopenia ([place]) 2010   Platelet count 99,000   Past Surgical History:  Procedure Laterality Date  . COLONOSCOPY  2004   Tics   . excision anal condyloma  02/05/12   Dr Zella Richer  . FLEXIBLE SIGMOIDOSCOPY  2012   Dr Collene Mares for evaluation of venereal warts  . TOTAL HIP ARTHROPLASTY Left 10/15/2016   Procedure: LEFT TOTAL HIP ARTHROPLASTY ANTERIOR APPROACH;  Surgeon: Gaynelle Arabian, MD;  Location: [facility];  Service: Orthopedics;  Laterality: Left;  . venereal wart resection  2012   Dr Zella Richer  . WISDOM TOOTH EXTRACTION     Family History  Problem Relation Age of Onset  . Meniere's disease Father   . Alcohol abuse Father   . Coronary artery disease Father        S/P CABG , > 55  . Lung cancer Maternal Uncle        smoker  . Diabetes Neg Hx   . Stroke Neg Hx   . Colon cancer Neg Hx     Review of Systems  Constitutional: Negative.   HENT: Negative.   Eyes: Negative.   Respiratory: Negative for cough, chest tightness and shortness of breath.   Cardiovascular: Negative for chest pain, palpitations and leg swelling.  Gastrointestinal: Negative for abdominal distention, abdominal pain, constipation, diarrhea, nausea and vomiting.  Musculoskeletal: Negative.   Skin: Negative.   Neurological: Negative.   Psychiatric/Behavioral: Negative.     Objective:  Physical Exam Constitutional:      Appearance: He is well-developed.  HENT:     Head: Normocephalic and atraumatic.  Neck:     Musculoskeletal: Normal range of motion.  Cardiovascular:  Rate and Rhythm: Normal rate and regular rhythm.  Pulmonary:     Effort: Pulmonary effort is normal. No respiratory distress.     Breath sounds: Normal breath sounds. No wheezing or rales.  Abdominal:     General: Bowel sounds are normal. There is no distension.     Palpations: Abdomen is soft.     Tenderness: There is no abdominal tenderness. There is no rebound.  Skin:    General: Skin is warm and dry.  Neurological:     Mental Status: He is alert and oriented to person, place, and time.     Coordination: Coordination normal.     Vitals:   08/01/19 1303   BP: (!) 160/100  Pulse: 66  Temp: 98.4 F (36.9 C)  TempSrc: Oral  SpO2: 98%  Weight: 172 lb (78 kg)  Height: 5\' 10"  (1.778 m)    Assessment & Plan:  Flu and pneumonia 23 given at visit

## 2019-08-02 DIAGNOSIS — D2271 Melanocytic nevi of right lower limb, including hip: Secondary | ICD-10-CM | POA: Diagnosis not present

## 2019-08-02 DIAGNOSIS — D2272 Melanocytic nevi of left lower limb, including hip: Secondary | ICD-10-CM | POA: Diagnosis not present

## 2019-08-02 DIAGNOSIS — D225 Melanocytic nevi of trunk: Secondary | ICD-10-CM | POA: Diagnosis not present

## 2019-08-02 DIAGNOSIS — L810 Postinflammatory hyperpigmentation: Secondary | ICD-10-CM | POA: Diagnosis not present

## 2019-08-02 DIAGNOSIS — R030 Elevated blood-pressure reading, without diagnosis of hypertension: Secondary | ICD-10-CM | POA: Insufficient documentation

## 2019-08-02 DIAGNOSIS — L814 Other melanin hyperpigmentation: Secondary | ICD-10-CM | POA: Diagnosis not present

## 2019-08-02 DIAGNOSIS — Z23 Encounter for immunization: Secondary | ICD-10-CM | POA: Diagnosis not present

## 2019-08-02 DIAGNOSIS — L821 Other seborrheic keratosis: Secondary | ICD-10-CM | POA: Diagnosis not present

## 2019-08-02 DIAGNOSIS — K649 Unspecified hemorrhoids: Secondary | ICD-10-CM | POA: Insufficient documentation

## 2019-08-02 NOTE — Assessment & Plan Note (Signed)
BP running high here but normal at home. Has known white coat hypertension and he is asked to continue monitoring at home. EKG 2019 without LVH or other changes.

## 2019-08-02 NOTE — Assessment & Plan Note (Signed)
After treatment and checking quantitative hep c and if negative will change to past problem.

## 2019-08-02 NOTE — Assessment & Plan Note (Signed)
Rx anusol to use to help minimize size.

## 2019-08-02 NOTE — Assessment & Plan Note (Signed)
Flu shot given. Pneumonia given 23 to complete series. Shingrix counseled. Tetanus due 2025. Colonoscopy due 2026. Counseled about sun safety and mole surveillance. Counseled about the dangers of distracted driving. Given 10 year screening recommendations.

## 2019-08-04 LAB — HEPATITIS C RNA QUANTITATIVE
HCV Quantitative Log: <1.18 Log IU/mL
HCV RNA, PCR, QN: <15 IU/mL

## 2019-08-11 ENCOUNTER — Other Ambulatory Visit: Payer: Self-pay

## 2019-08-11 DIAGNOSIS — Z20822 Contact with and (suspected) exposure to covid-19: Secondary | ICD-10-CM

## 2019-08-12 LAB — NOVEL CORONAVIRUS, NAA: SARS-CoV-2, NAA: NOT DETECTED

## 2019-08-16 DIAGNOSIS — Z20828 Contact with and (suspected) exposure to other viral communicable diseases: Secondary | ICD-10-CM | POA: Diagnosis not present

## 2019-08-31 DIAGNOSIS — L820 Inflamed seborrheic keratosis: Secondary | ICD-10-CM | POA: Diagnosis not present

## 2019-09-29 ENCOUNTER — Ambulatory Visit: Payer: Medicare Other | Attending: Internal Medicine

## 2019-09-29 DIAGNOSIS — Z20822 Contact with and (suspected) exposure to covid-19: Secondary | ICD-10-CM

## 2019-10-01 LAB — NOVEL CORONAVIRUS, NAA: SARS-CoV-2, NAA: NOT DETECTED

## 2019-12-05 DIAGNOSIS — Z23 Encounter for immunization: Secondary | ICD-10-CM | POA: Diagnosis not present

## 2019-12-14 DIAGNOSIS — H2513 Age-related nuclear cataract, bilateral: Secondary | ICD-10-CM | POA: Diagnosis not present

## 2019-12-16 ENCOUNTER — Encounter: Payer: Self-pay | Admitting: Internal Medicine

## 2019-12-19 ENCOUNTER — Encounter: Payer: Self-pay | Admitting: Internal Medicine

## 2019-12-19 ENCOUNTER — Encounter (INDEPENDENT_AMBULATORY_CARE_PROVIDER_SITE_OTHER): Payer: Medicare Other | Admitting: Ophthalmology

## 2019-12-19 DIAGNOSIS — H34832 Tributary (branch) retinal vein occlusion, left eye, with macular edema: Secondary | ICD-10-CM

## 2019-12-19 DIAGNOSIS — H2513 Age-related nuclear cataract, bilateral: Secondary | ICD-10-CM

## 2019-12-19 DIAGNOSIS — H43813 Vitreous degeneration, bilateral: Secondary | ICD-10-CM | POA: Diagnosis not present

## 2019-12-20 ENCOUNTER — Ambulatory Visit: Payer: Medicare Other | Admitting: Internal Medicine

## 2019-12-20 ENCOUNTER — Ambulatory Visit (INDEPENDENT_AMBULATORY_CARE_PROVIDER_SITE_OTHER): Payer: Medicare Other | Admitting: Internal Medicine

## 2019-12-20 ENCOUNTER — Encounter: Payer: Self-pay | Admitting: Internal Medicine

## 2019-12-20 ENCOUNTER — Other Ambulatory Visit: Payer: Self-pay

## 2019-12-20 VITALS — BP 182/96 | HR 61 | Temp 98.8°F | Ht 70.0 in | Wt 168.4 lb

## 2019-12-20 DIAGNOSIS — R7301 Impaired fasting glucose: Secondary | ICD-10-CM | POA: Diagnosis not present

## 2019-12-20 DIAGNOSIS — H3560 Retinal hemorrhage, unspecified eye: Secondary | ICD-10-CM | POA: Diagnosis not present

## 2019-12-20 DIAGNOSIS — R030 Elevated blood-pressure reading, without diagnosis of hypertension: Secondary | ICD-10-CM | POA: Diagnosis not present

## 2019-12-20 LAB — HEMOGLOBIN A1C: Hgb A1c MFr Bld: 4.9 % (ref 4.6–6.5)

## 2019-12-20 NOTE — Patient Instructions (Signed)
The blood pressure goal is 140/90.    DASH Eating Plan DASH stands for "Dietary Approaches to Stop Hypertension." The DASH eating plan is a healthy eating plan that has been shown to reduce high blood pressure (hypertension). It may also reduce your risk for type 2 diabetes, heart disease, and stroke. The DASH eating plan may also help with weight loss. What are tips for following this plan?  General guidelines  Avoid eating more than 3000 mg (milligrams) of salt (sodium) a day. If you have hypertension, you may need to reduce your sodium intake to 1,500 mg a day.  Limit alcohol intake to no more than 1 drink a day for nonpregnant women and 2 drinks a day for men. One drink equals 12 oz of beer, 5 oz of wine, or 1 oz of hard liquor.  Work with your health care provider to maintain a healthy body weight or to lose weight. Ask what an ideal weight is for you.  Get at least 30 minutes of exercise that causes your heart to beat faster (aerobic exercise) most days of the week. Activities may include walking, swimming, or biking.  Work with your health care provider or diet and nutrition specialist (dietitian) to adjust your eating plan to your individual calorie needs. Reading food labels   Check food labels for the amount of sodium per serving. Choose foods with less than 5 percent of the Daily Value of sodium. Generally, foods with less than 300 mg of sodium per serving fit into this eating plan.  To find whole grains, look for the word "whole" as the first word in the ingredient list. Shopping  Buy products labeled as "low-sodium" or "no salt added."  Buy fresh foods. Avoid canned foods and premade or frozen meals. Cooking  Avoid adding salt when cooking. Use salt-free seasonings or herbs instead of table salt or sea salt. Check with your health care provider or pharmacist before using salt substitutes.  Do not fry foods. Cook foods using healthy methods such as baking, boiling,  grilling, and broiling instead.  Cook with heart-healthy oils, such as olive, canola, soybean, or sunflower oil. Meal planning  Eat a balanced diet that includes: ? 5 or more servings of fruits and vegetables each day. At each meal, try to fill half of your plate with fruits and vegetables. ? Up to 6-8 servings of whole grains each day. ? Less than 6 oz of lean meat, poultry, or fish each day. A 3-oz serving of meat is about the same size as a deck of cards. One egg equals 1 oz. ? 2 servings of low-fat dairy each day. ? A serving of nuts, seeds, or beans 5 times each week. ? Heart-healthy fats. Healthy fats called Omega-3 fatty acids are found in foods such as flaxseeds and coldwater fish, like sardines, salmon, and mackerel.  Limit how much you eat of the following: ? Canned or prepackaged foods. ? Food that is high in trans fat, such as fried foods. ? Food that is high in saturated fat, such as fatty meat. ? Sweets, desserts, sugary drinks, and other foods with added sugar. ? Full-fat dairy products.  Do not salt foods before eating.  Try to eat at least 2 vegetarian meals each week.  Eat more home-cooked food and less restaurant, buffet, and fast food.  When eating at a restaurant, ask that your food be prepared with less salt or no salt, if possible. What foods are recommended? The items listed may not  be a complete list. Talk with your dietitian about what dietary choices are best for you. Grains Whole-grain or whole-wheat bread. Whole-grain or whole-wheat pasta. Brown rice. [place]. Bulgur. Whole-grain and low-sodium cereals. Pita bread. Low-fat, low-sodium crackers. Whole-wheat flour tortillas. Vegetables Fresh or frozen vegetables (raw, steamed, roasted, or grilled). Low-sodium or reduced-sodium tomato and vegetable juice. Low-sodium or reduced-sodium tomato sauce and tomato paste. Low-sodium or reduced-sodium canned vegetables. Fruits All fresh, dried, or frozen  fruit. Canned fruit in natural juice (without added sugar). Meat and other protein foods Skinless chicken or [place]. Ground chicken or [place]. Pork with fat trimmed off. Fish and seafood. Egg whites. Dried beans, peas, or lentils. Unsalted nuts, nut butters, and seeds. Unsalted canned beans. Lean cuts of beef with fat trimmed off. Low-sodium, lean deli meat. Dairy Low-fat (1%) or fat-free (skim) milk. Fat-free, low-fat, or reduced-fat cheeses. Nonfat, low-sodium ricotta or cottage cheese. Low-fat or nonfat yogurt. Low-fat, low-sodium cheese. Fats and oils Soft margarine without trans fats. Vegetable oil. Low-fat, reduced-fat, or light mayonnaise and salad dressings (reduced-sodium). Canola, safflower, olive, soybean, and sunflower oils. Avocado. Seasoning and other foods Herbs. Spices. Seasoning mixes without salt. Unsalted popcorn and pretzels. Fat-free sweets. What foods are not recommended? The items listed may not be a complete list. Talk with your dietitian about what dietary choices are best for you. Grains Baked goods made with fat, such as croissants, muffins, or some breads. Dry pasta or rice meal packs. Vegetables Creamed or fried vegetables. Vegetables in a cheese sauce. Regular canned vegetables (not low-sodium or reduced-sodium). Regular canned tomato sauce and paste (not low-sodium or reduced-sodium). Regular tomato and vegetable juice (not low-sodium or reduced-sodium). Angie Fava. Olives. Fruits Canned fruit in a light or heavy syrup. Fried fruit. Fruit in cream or butter sauce. Meat and other protein foods Fatty cuts of meat. Ribs. Fried meat. Berniece Salines. Sausage. Bologna and other processed lunch meats. Salami. Fatback. Hotdogs. Bratwurst. Salted nuts and seeds. Canned beans with added salt. Canned or smoked fish. Whole eggs or egg yolks. Chicken or [place] with skin. Dairy Whole or 2% milk, cream, and half-and-half. Whole or full-fat cream cheese. Whole-fat or sweetened yogurt. Full-fat  cheese. Nondairy creamers. Whipped toppings. Processed cheese and cheese spreads. Fats and oils Butter. Stick margarine. Lard. Shortening. Ghee. Bacon fat. Tropical oils, such as coconut, palm kernel, or palm oil. Seasoning and other foods Salted popcorn and pretzels. Onion salt, garlic salt, seasoned salt, table salt, and sea salt. Worcestershire sauce. Tartar sauce. Barbecue sauce. Teriyaki sauce. Soy sauce, including reduced-sodium. Steak sauce. Canned and packaged gravies. Fish sauce. Oyster sauce. Cocktail sauce. Horseradish that you find on the shelf. Ketchup. Mustard. Meat flavorings and tenderizers. Bouillon cubes. Hot sauce and Tabasco sauce. Premade or packaged marinades. Premade or packaged taco seasonings. Relishes. Regular salad dressings. Where to find more information:  National Heart, Lung, and [place]: https://wilson-eaton.com/  American Heart Association: www.heart.org Summary  The DASH eating plan is a healthy eating plan that has been shown to reduce high blood pressure (hypertension). It may also reduce your risk for type 2 diabetes, heart disease, and stroke.  With the DASH eating plan, you should limit salt (sodium) intake to 2,300 mg a day. If you have hypertension, you may need to reduce your sodium intake to 1,500 mg a day.  When on the DASH eating plan, aim to eat more fresh fruits and vegetables, whole grains, lean proteins, low-fat dairy, and heart-healthy fats.  Work with your health care provider or diet and nutrition  specialist (dietitian) to adjust your eating plan to your individual calorie needs. This information is not intended to replace advice given to you by your health care provider. Make sure you discuss any questions you have with your health care provider. Document Revised: 09/04/2017 Document Reviewed: 09/15/2016 Elsevier Patient Education  2020 [employer].

## 2019-12-20 NOTE — Progress Notes (Signed)
   Subjective:   Patient ID: Timothy Bentley, male    DOB: 09-07-1951, 69 y.o.   MRN: [id number]  HPI The patient is a 69 YO man coming in for concerns about blood pressure. Not checking at home lately but in prior times was running around 130-140s/90ish at home. Denies headaches or chest pains. Had eye exam recently with signs of retinal hemorrhage and will be having a procedure for this. He was advised to talk to his doctor about BP and diabetes potential.  Does not take blood pressure medication. Denies change in diet or exercise. Weight is is stable over the last several months. Does have high sodium diet.   Review of Systems  Constitutional: Negative.   HENT: Negative.   Eyes: Negative.   Respiratory: Negative for cough, chest tightness and shortness of breath.   Cardiovascular: Negative for chest pain, palpitations and leg swelling.  Gastrointestinal: Negative for abdominal distention, abdominal pain, constipation, diarrhea, nausea and vomiting.  Musculoskeletal: Negative.   Skin: Negative.   Neurological: Negative.   Psychiatric/Behavioral: Negative.     Objective:  Physical Exam Constitutional:      Appearance: He is well-developed.  HENT:     Head: Normocephalic and atraumatic.  Cardiovascular:     Rate and Rhythm: Normal rate and regular rhythm.  Pulmonary:     Effort: Pulmonary effort is normal. No respiratory distress.     Breath sounds: Normal breath sounds. No wheezing or rales.  Abdominal:     General: Bowel sounds are normal. There is no distension.     Palpations: Abdomen is soft.     Tenderness: There is no abdominal tenderness. There is no rebound.  Musculoskeletal:     Cervical back: Normal range of motion.  Skin:    General: Skin is warm and dry.  Neurological:     Mental Status: He is alert and oriented to person, place, and time.     Coordination: Coordination normal.     Vitals:   12/20/19 1333  BP: (!) 182/96  Pulse: 61  Temp: 98.8 F (37.1 C)    TempSrc: Oral  SpO2: 98%  Weight: 168 lb 6 oz (76.4 kg)  Height: 5\' 10"  (1.778 m)    This visit occurred during the SARS-CoV-2 public health emergency.  Safety protocols were in place, including screening questions prior to the visit, additional usage of staff PPE, and extensive cleaning of exam room while observing appropriate contact time as indicated for disinfecting solutions.   Assessment & Plan:

## 2019-12-21 NOTE — Assessment & Plan Note (Signed)
With BP elevated today, he will monitor at home as previously normal at home. With signs of retinal hemorrhage he will start DASH diet. Already with regular exercise. If no improvement with DASH diet agrees to BP medication. Checking HgA1c to rule out diabetes although he has not had elevated sugar readings on labs previously and no family history of diabetes.

## 2019-12-27 ENCOUNTER — Encounter: Payer: Self-pay | Admitting: Internal Medicine

## 2019-12-27 MED ORDER — LOSARTAN POTASSIUM 100 MG PO TABS: 100.0000 mg | ORAL_TABLET | Freq: Every day | ORAL | 0 refills | Status: DC

## 2019-12-27 NOTE — Telephone Encounter (Signed)
I will call in blood pressure medication for him as was discussed with Dr. Sharlet Salina; Will start with Losartan 100 mg daily- try to take at the same time each day;  would recommend that he plan to come see her in the office in follow-up in about 2 weeks; Continue DASH diet;

## 2019-12-27 NOTE — Telephone Encounter (Signed)
MD is out off the office this week. Pls advise on pt email for recommendations.Marland KitchenJohny Chess

## 2020-01-03 DIAGNOSIS — Z23 Encounter for immunization: Secondary | ICD-10-CM | POA: Diagnosis not present

## 2020-01-11 ENCOUNTER — Encounter: Payer: Self-pay | Admitting: Internal Medicine

## 2020-01-19 ENCOUNTER — Encounter: Payer: Self-pay | Admitting: Internal Medicine

## 2020-01-19 ENCOUNTER — Other Ambulatory Visit: Payer: Self-pay

## 2020-01-19 ENCOUNTER — Ambulatory Visit (INDEPENDENT_AMBULATORY_CARE_PROVIDER_SITE_OTHER): Payer: Medicare Other | Admitting: Internal Medicine

## 2020-01-19 VITALS — BP 174/86 | HR 63 | Temp 98.4°F | Ht 70.0 in | Wt 162.0 lb

## 2020-01-19 DIAGNOSIS — R030 Elevated blood-pressure reading, without diagnosis of hypertension: Secondary | ICD-10-CM | POA: Diagnosis not present

## 2020-01-19 NOTE — Patient Instructions (Signed)
We will get the stress test done for the heart and get you back into a therapist to work on the stress.

## 2020-01-19 NOTE — Progress Notes (Signed)
   Subjective:   Patient ID: Timothy Bentley, male    DOB: Dec 24, 1950, 69 y.o.   MRN: [id number]  HPI The patient is a 69 YO man coming in for follow up blood pressure. Since last visit he has been monitoring BP at home and still having elevated readings. He has been working on establishing a workout routine and reducing sodium in diet. This has not helped much. Denies chest pains or SOB or headaches. Would like to continue with lifestyle adjustments for a few months but wanted to assess his heart health a little more first to make sure he was not doing harm by waiting.   Review of Systems  Constitutional: Negative.   HENT: Negative.   Eyes: Negative.   Respiratory: Negative for cough, chest tightness and shortness of breath.   Cardiovascular: Negative for chest pain, palpitations and leg swelling.  Gastrointestinal: Negative for abdominal distention, abdominal pain, constipation, diarrhea, nausea and vomiting.  Musculoskeletal: Negative.   Skin: Negative.   Neurological: Negative.   Psychiatric/Behavioral: Negative.     Objective:  Physical Exam Constitutional:      Appearance: He is well-developed.  HENT:     Head: Normocephalic and atraumatic.  Cardiovascular:     Rate and Rhythm: Normal rate and regular rhythm.  Pulmonary:     Effort: Pulmonary effort is normal. No respiratory distress.     Breath sounds: Normal breath sounds. No wheezing or rales.  Abdominal:     General: Bowel sounds are normal. There is no distension.     Palpations: Abdomen is soft.     Tenderness: There is no abdominal tenderness. There is no rebound.  Musculoskeletal:     Cervical back: Normal range of motion.  Skin:    General: Skin is warm and dry.  Neurological:     Mental Status: He is alert and oriented to person, place, and time.     Coordination: Coordination normal.     Vitals:   01/19/20 1423  BP: (!) 174/86  Pulse: 63  Temp: 98.4 F (36.9 C)  TempSrc: Oral  SpO2: 98%  Weight: 162 lb  (73.5 kg)  Height: 5\' 10"  (1.778 m)    This visit occurred during the SARS-CoV-2 public health emergency.  Safety protocols were in place, including screening questions prior to the visit, additional usage of staff PPE, and extensive cleaning of exam room while observing appropriate contact time as indicated for disinfecting solutions.   Assessment & Plan:

## 2020-01-20 ENCOUNTER — Encounter: Payer: Self-pay | Admitting: Internal Medicine

## 2020-01-20 NOTE — Assessment & Plan Note (Signed)
Ordered stress test to assess for blockages in heart. He wishes to wait on medication at this time and has not started losartan 100 mg daily yet. Will return after stress testing for BP recheck and may consider medication at that time.

## 2020-01-25 ENCOUNTER — Encounter: Payer: Self-pay | Admitting: Internal Medicine

## 2020-01-31 ENCOUNTER — Other Ambulatory Visit: Payer: Self-pay | Admitting: Family

## 2020-02-09 ENCOUNTER — Ambulatory Visit (INDEPENDENT_AMBULATORY_CARE_PROVIDER_SITE_OTHER): Payer: Medicare Other | Admitting: Psychologist

## 2020-02-09 DIAGNOSIS — F411 Generalized anxiety disorder: Secondary | ICD-10-CM | POA: Diagnosis not present

## 2020-02-29 ENCOUNTER — Ambulatory Visit: Payer: Medicare Other | Admitting: Psychologist

## 2020-03-07 ENCOUNTER — Ambulatory Visit (INDEPENDENT_AMBULATORY_CARE_PROVIDER_SITE_OTHER): Payer: Medicare Other | Admitting: Psychology

## 2020-03-07 DIAGNOSIS — F419 Anxiety disorder, unspecified: Secondary | ICD-10-CM | POA: Diagnosis not present

## 2020-03-12 ENCOUNTER — Ambulatory Visit: Payer: BLUE CROSS/BLUE SHIELD | Admitting: Psychologist

## 2020-03-21 ENCOUNTER — Ambulatory Visit: Payer: Medicare Other | Admitting: Internal Medicine

## 2020-03-22 ENCOUNTER — Encounter: Payer: Self-pay | Admitting: Internal Medicine

## 2020-03-22 ENCOUNTER — Encounter (INDEPENDENT_AMBULATORY_CARE_PROVIDER_SITE_OTHER): Payer: Medicare Other | Admitting: Ophthalmology

## 2020-03-22 ENCOUNTER — Other Ambulatory Visit: Payer: Self-pay

## 2020-03-22 ENCOUNTER — Ambulatory Visit (INDEPENDENT_AMBULATORY_CARE_PROVIDER_SITE_OTHER): Payer: Medicare Other | Admitting: Internal Medicine

## 2020-03-22 DIAGNOSIS — R030 Elevated blood-pressure reading, without diagnosis of hypertension: Secondary | ICD-10-CM | POA: Diagnosis not present

## 2020-03-22 DIAGNOSIS — H43813 Vitreous degeneration, bilateral: Secondary | ICD-10-CM

## 2020-03-22 DIAGNOSIS — H34832 Tributary (branch) retinal vein occlusion, left eye, with macular edema: Secondary | ICD-10-CM

## 2020-03-22 NOTE — Assessment & Plan Note (Signed)
BP is lower overall with the lifestyle changes. Will await stress testing. Adjust as needed and follow up in 2 months or so for close monitoring.

## 2020-03-22 NOTE — Progress Notes (Signed)
   Subjective:   Patient ID: Timothy Bentley, male    DOB: 12-30-1950, 69 y.o.   MRN: [id number]  HPI The patient is a 69 YO man coming in for concerns about blood pressure. Still monitoring BP at home and this has been decreasing. Scheduled for stress test 04/10/20. Denies chest pains or headaches. Is monitoring salt in diet and exercising more.   Review of Systems  Constitutional: Negative.   HENT: Negative.   Eyes: Negative.   Respiratory: Negative for cough, chest tightness and shortness of breath.   Cardiovascular: Negative for chest pain, palpitations and leg swelling.  Gastrointestinal: Negative for abdominal distention, abdominal pain, constipation, diarrhea, nausea and vomiting.  Musculoskeletal: Negative.   Skin: Negative.   Neurological: Negative.   Psychiatric/Behavioral: Negative.     Objective:  Physical Exam Constitutional:      Appearance: He is well-developed.  HENT:     Head: Normocephalic and atraumatic.  Cardiovascular:     Rate and Rhythm: Normal rate and regular rhythm.  Pulmonary:     Effort: Pulmonary effort is normal. No respiratory distress.     Breath sounds: Normal breath sounds. No wheezing or rales.  Abdominal:     General: Bowel sounds are normal. There is no distension.     Palpations: Abdomen is soft.     Tenderness: There is no abdominal tenderness. There is no rebound.  Musculoskeletal:     Cervical back: Normal range of motion.  Skin:    General: Skin is warm and dry.  Neurological:     Mental Status: He is alert and oriented to person, place, and time.     Coordination: Coordination normal.     Vitals:   03/22/20 1024  BP: (!) 142/88  Pulse: 65  Temp: 97.9 F (36.6 C)  TempSrc: Oral  SpO2: 98%  Weight: 155 lb (70.3 kg)  Height: 5\' 10"  (1.778 m)    This visit occurred during the SARS-CoV-2 public health emergency.  Safety protocols were in place, including screening questions prior to the visit, additional usage of staff PPE, and  extensive cleaning of exam room while observing appropriate contact time as indicated for disinfecting solutions.   Assessment & Plan:

## 2020-03-27 ENCOUNTER — Encounter: Payer: Self-pay | Admitting: Internal Medicine

## 2020-03-27 ENCOUNTER — Ambulatory Visit (INDEPENDENT_AMBULATORY_CARE_PROVIDER_SITE_OTHER): Payer: Medicare Other | Admitting: Psychology

## 2020-03-27 DIAGNOSIS — F419 Anxiety disorder, unspecified: Secondary | ICD-10-CM | POA: Diagnosis not present

## 2020-03-27 MED ORDER — AMLODIPINE BESYLATE 10 MG PO TABS: 10.0000 mg | ORAL_TABLET | Freq: Every day | ORAL | 3 refills | Status: DC

## 2020-04-10 ENCOUNTER — Other Ambulatory Visit: Payer: Self-pay

## 2020-04-10 ENCOUNTER — Ambulatory Visit (INDEPENDENT_AMBULATORY_CARE_PROVIDER_SITE_OTHER): Payer: Medicare Other

## 2020-04-10 DIAGNOSIS — R030 Elevated blood-pressure reading, without diagnosis of hypertension: Secondary | ICD-10-CM | POA: Diagnosis not present

## 2020-04-10 LAB — EXERCISE TOLERANCE TEST
Estimated workload: 10.1 METS
Exercise duration (min): 9 min
Exercise duration (sec): 0 s
MPHR: 154 {beats}/min
Peak HR: 141 {beats}/min
Percent HR: 91 %
RPE: 13
Rest HR: 102 {beats}/min

## 2020-04-19 ENCOUNTER — Other Ambulatory Visit: Payer: Self-pay

## 2020-04-19 ENCOUNTER — Encounter (INDEPENDENT_AMBULATORY_CARE_PROVIDER_SITE_OTHER): Payer: Medicare Other | Admitting: Ophthalmology

## 2020-04-19 DIAGNOSIS — I10 Essential (primary) hypertension: Secondary | ICD-10-CM | POA: Diagnosis not present

## 2020-04-19 DIAGNOSIS — H35033 Hypertensive retinopathy, bilateral: Secondary | ICD-10-CM | POA: Diagnosis not present

## 2020-04-19 DIAGNOSIS — H34832 Tributary (branch) retinal vein occlusion, left eye, with macular edema: Secondary | ICD-10-CM

## 2020-04-19 DIAGNOSIS — H43813 Vitreous degeneration, bilateral: Secondary | ICD-10-CM | POA: Diagnosis not present

## 2020-04-26 ENCOUNTER — Ambulatory Visit (INDEPENDENT_AMBULATORY_CARE_PROVIDER_SITE_OTHER): Payer: Medicare Other | Admitting: Psychology

## 2020-04-26 DIAGNOSIS — F419 Anxiety disorder, unspecified: Secondary | ICD-10-CM

## 2020-05-17 ENCOUNTER — Encounter (INDEPENDENT_AMBULATORY_CARE_PROVIDER_SITE_OTHER): Payer: Medicare Other | Admitting: Ophthalmology

## 2020-05-22 ENCOUNTER — Encounter: Payer: Self-pay | Admitting: Internal Medicine

## 2020-05-23 ENCOUNTER — Ambulatory Visit: Payer: BLUE CROSS/BLUE SHIELD | Admitting: Psychology

## 2020-05-23 ENCOUNTER — Encounter (INDEPENDENT_AMBULATORY_CARE_PROVIDER_SITE_OTHER): Payer: Medicare Other | Admitting: Ophthalmology

## 2020-05-23 ENCOUNTER — Other Ambulatory Visit: Payer: Self-pay

## 2020-05-23 DIAGNOSIS — H35033 Hypertensive retinopathy, bilateral: Secondary | ICD-10-CM | POA: Diagnosis not present

## 2020-05-23 DIAGNOSIS — H34832 Tributary (branch) retinal vein occlusion, left eye, with macular edema: Secondary | ICD-10-CM | POA: Diagnosis not present

## 2020-05-23 DIAGNOSIS — I10 Essential (primary) hypertension: Secondary | ICD-10-CM | POA: Diagnosis not present

## 2020-05-23 DIAGNOSIS — H2513 Age-related nuclear cataract, bilateral: Secondary | ICD-10-CM | POA: Diagnosis not present

## 2020-05-23 DIAGNOSIS — H43813 Vitreous degeneration, bilateral: Secondary | ICD-10-CM | POA: Diagnosis not present

## 2020-05-31 ENCOUNTER — Ambulatory Visit (INDEPENDENT_AMBULATORY_CARE_PROVIDER_SITE_OTHER): Payer: Medicare Other | Admitting: Psychology

## 2020-05-31 DIAGNOSIS — F419 Anxiety disorder, unspecified: Secondary | ICD-10-CM

## 2020-06-08 DIAGNOSIS — Z20822 Contact with and (suspected) exposure to covid-19: Secondary | ICD-10-CM | POA: Diagnosis not present

## 2020-06-18 ENCOUNTER — Ambulatory Visit (INDEPENDENT_AMBULATORY_CARE_PROVIDER_SITE_OTHER): Payer: Medicare Other | Admitting: Psychology

## 2020-06-18 DIAGNOSIS — F419 Anxiety disorder, unspecified: Secondary | ICD-10-CM

## 2020-06-19 ENCOUNTER — Encounter: Payer: Self-pay | Admitting: Internal Medicine

## 2020-06-20 ENCOUNTER — Encounter (INDEPENDENT_AMBULATORY_CARE_PROVIDER_SITE_OTHER): Payer: Medicare Other | Admitting: Ophthalmology

## 2020-06-20 ENCOUNTER — Ambulatory Visit: Payer: BLUE CROSS/BLUE SHIELD | Admitting: Psychology

## 2020-06-20 ENCOUNTER — Other Ambulatory Visit: Payer: Self-pay

## 2020-06-20 DIAGNOSIS — H34832 Tributary (branch) retinal vein occlusion, left eye, with macular edema: Secondary | ICD-10-CM

## 2020-06-20 DIAGNOSIS — H35033 Hypertensive retinopathy, bilateral: Secondary | ICD-10-CM | POA: Diagnosis not present

## 2020-06-20 DIAGNOSIS — I10 Essential (primary) hypertension: Secondary | ICD-10-CM | POA: Diagnosis not present

## 2020-06-20 DIAGNOSIS — H43813 Vitreous degeneration, bilateral: Secondary | ICD-10-CM | POA: Diagnosis not present

## 2020-06-21 ENCOUNTER — Encounter: Payer: Self-pay | Admitting: Internal Medicine

## 2020-06-21 ENCOUNTER — Ambulatory Visit (INDEPENDENT_AMBULATORY_CARE_PROVIDER_SITE_OTHER): Payer: Medicare Other | Admitting: Internal Medicine

## 2020-06-21 ENCOUNTER — Other Ambulatory Visit: Payer: Self-pay

## 2020-06-21 VITALS — BP 160/86 | HR 85 | Temp 98.6°F | Resp 16 | Ht 70.0 in | Wt 163.1 lb

## 2020-06-21 DIAGNOSIS — I119 Hypertensive heart disease without heart failure: Secondary | ICD-10-CM | POA: Diagnosis not present

## 2020-06-21 DIAGNOSIS — I10 Essential (primary) hypertension: Secondary | ICD-10-CM | POA: Diagnosis not present

## 2020-06-21 DIAGNOSIS — E785 Hyperlipidemia, unspecified: Secondary | ICD-10-CM

## 2020-06-21 DIAGNOSIS — R799 Abnormal finding of blood chemistry, unspecified: Secondary | ICD-10-CM

## 2020-06-21 DIAGNOSIS — Z23 Encounter for immunization: Secondary | ICD-10-CM | POA: Diagnosis not present

## 2020-06-21 DIAGNOSIS — Z0000 Encounter for general adult medical examination without abnormal findings: Secondary | ICD-10-CM

## 2020-06-21 DIAGNOSIS — D696 Thrombocytopenia, unspecified: Secondary | ICD-10-CM

## 2020-06-21 MED ORDER — EDARBYCLOR 40-12.5 MG PO TABS: 1.0000 | ORAL_TABLET | Freq: Every day | ORAL | 0 refills | Status: DC

## 2020-06-21 MED ORDER — ROSUVASTATIN CALCIUM 5 MG PO TABS: 5.0000 mg | ORAL_TABLET | Freq: Every day | ORAL | 1 refills | Status: DC

## 2020-06-21 NOTE — Patient Instructions (Signed)

## 2020-06-21 NOTE — Progress Notes (Signed)
Subjective:  Patient ID: Timothy Bentley, male    DOB: May 31, 1951  Age: 69 y.o. MRN: [id number]  CC: Hypertension  This visit occurred during the SARS-CoV-2 public health emergency.  Safety protocols were in place, including screening questions prior to the visit, additional usage of staff PPE, and extensive cleaning of exam room while observing appropriate contact time as indicated for disinfecting solutions.    HPI Timothy Bentley presents for f/up - He is concerned that his blood pressure is not adequately well controlled.  An EKG done 2 years ago showed LVH.  He had an exercise tolerance test done 2 months ago that was within normal limits.  He has prescriptions for amlodipine and losartan.  He has not recently been taking the losartan but recently restarted the amlodipine and says it did not help lower his blood pressure much.  He is very active and denies any recent episodes of chest pain shortness of breath.  He denies headache, blurred vision, diaphoresis, edema, or palpitations.  Outpatient Medications Prior to Visit  Medication Sig Dispense Refill   amLODipine (NORVASC) 10 MG tablet Take 1 tablet (10 mg total) by mouth daily. 90 tablet 3   No facility-administered medications prior to visit.    ROS Review of Systems  Constitutional: Negative for chills, diaphoresis and fatigue.  HENT: Negative.   Eyes: Negative for visual disturbance.  Respiratory: Negative for cough, chest tightness, shortness of breath and wheezing.   Cardiovascular: Negative for chest pain, palpitations and leg swelling.  Gastrointestinal: Negative for abdominal pain, constipation, diarrhea, nausea and vomiting.  Endocrine: Negative.   Genitourinary: Negative for difficulty urinating, dysuria and hematuria.  Musculoskeletal: Negative for arthralgias and myalgias.  Skin: Negative.  Negative for color change and pallor.  Neurological: Negative.  Negative for dizziness, weakness, light-headedness and headaches.    Hematological: Negative for adenopathy. Does not bruise/bleed easily.  Psychiatric/Behavioral: Negative.     Objective:  BP (!) 160/86 (BP Location: Left Arm, Patient Position: Sitting, Cuff Size: Normal)    Pulse 85    Temp 98.6 F (37 C) (Oral)    Resp 16    Ht 5\' 10"  (1.778 m)    Wt 163 lb 2 oz (74 kg)    SpO2 97%    BMI 23.41 kg/m   BP Readings from Last 3 Encounters:  06/21/20 (!) 160/86  03/22/20 (!) 142/88  01/19/20 (!) 174/86    Wt Readings from Last 3 Encounters:  06/21/20 163 lb 2 oz (74 kg)  03/22/20 155 lb (70.3 kg)  01/19/20 162 lb (73.5 kg)    Physical Exam Vitals reviewed.  HENT:     Nose: Nose normal.     Mouth/Throat:     Mouth: Mucous membranes are moist.  Eyes:     General: No scleral icterus.    Conjunctiva/sclera: Conjunctivae normal.  Cardiovascular:     Rate and Rhythm: Normal rate and regular rhythm.     Heart sounds: No murmur heard.   Pulmonary:     Effort: Pulmonary effort is normal.     Breath sounds: No stridor. No wheezing, rhonchi or rales.  Abdominal:     General: Abdomen is flat.     Palpations: There is no mass.     Tenderness: There is no abdominal tenderness. There is no guarding.  Musculoskeletal:        General: Normal range of motion.     Cervical back: Neck supple.     Right lower leg: No edema.  Left lower leg: No edema.  Lymphadenopathy:     Cervical: No cervical adenopathy.  Skin:    General: Skin is warm and dry.     Coloration: Skin is not pale.  Neurological:     General: No focal deficit present.     Mental Status: He is alert.  Psychiatric:        Mood and Affect: Mood normal.        Behavior: Behavior normal.     Lab Results  Component Value Date   WBC 4.6 06/21/2020   HGB 16.3 06/21/2020   HCT 47.5 06/21/2020   PLT 143 06/21/2020   GLUCOSE 81 06/21/2020   CHOL 151 08/01/2019   TRIG 88.0 08/01/2019   HDL 46.50 08/01/2019   LDLCALC 87 08/01/2019   ALT 24 06/21/2020   AST 24 06/21/2020   NA  143 06/21/2020   K 4.5 06/21/2020   CL 106 06/21/2020   CREATININE 0.86 06/21/2020   BUN 35 (H) 06/21/2020   CO2 27 06/21/2020   TSH 1.61 06/21/2020   PSA 0.49 07/28/2018   INR 1.02 10/10/2016   HGBA1C 4.9 12/20/2019    No results found.  Assessment & Plan:   Cable was seen today for hypertension.  Diagnoses and all orders for this visit:  Thrombocytopenia ([place])- His PLT count is normal now. B12 and folate are normal. -     CBC with Differential/Platelet; Future -     Vitamin B12; Future -     Folate; Future -     Folate -     Vitamin B12 -     CBC with Differential/Platelet  Need for influenza vaccination -     Flu Vaccine QUAD High Dose(Fluad)  Essential hypertension- His BP is not well controlled. I recommended that he start a potent ARB and a thiazide diuretic. -     Hepatic function panel; Future -     BASIC METABOLIC PANEL WITH GFR; Future -     Urinalysis, Routine w reflex microscopic; Future -     TSH; Future -     Azilsartan-Chlorthalidone (EDARBYCLOR) 40-12.5 MG TABS; Take 1 tablet by mouth daily. -     TSH -     Urinalysis, Routine w reflex microscopic -     BASIC METABOLIC PANEL WITH GFR -     Hepatic function panel  Routine general medical examination at a health care facility  Hyperlipidemia LDL goal <70- His ASCVD risk score is 21.5%. I recommended that he take a statin for CV risk reduction. -     rosuvastatin (CRESTOR) 5 MG tablet; Take 1 tablet (5 mg total) by mouth daily.  Elevated BUN- I have asked to stay well hydrated.  Hypertensive left ventricular hypertrophy, without heart failure- See above.   I have discontinued Cage Giammarco's amLODipine. I am also having him start on Edarbyclor and rosuvastatin.  Meds ordered this encounter  Medications   Azilsartan-Chlorthalidone (EDARBYCLOR) 40-12.5 MG TABS    Sig: Take 1 tablet by mouth daily.    Dispense:  90 tablet    Refill:  0   rosuvastatin (CRESTOR) 5 MG tablet    Sig: Take 1 tablet (5  mg total) by mouth daily.    Dispense:  90 tablet    Refill:  1   I spent 50 minutes in preparing to see the patient by review of recent labs, imaging and procedures, obtaining and reviewing separately obtained history, communicating with the patient and family or caregiver, ordering  medications, tests or procedures, and documenting clinical information in the EHR including the differential Dx, treatment, and any further evaluation and other management of  1. Thrombocytopenia ([place]) 2. Essential hypertension 3. Hyperlipidemia LDL goal <70 4. Elevated BUN 6. Hypertensive left ventricular hypertrophy, without heart failure     Follow-up: Return in about 3 months (around 09/20/2020).  Scarlette Calico, MD

## 2020-06-22 DIAGNOSIS — I119 Hypertensive heart disease without heart failure: Secondary | ICD-10-CM | POA: Insufficient documentation

## 2020-06-22 DIAGNOSIS — R799 Abnormal finding of blood chemistry, unspecified: Secondary | ICD-10-CM | POA: Insufficient documentation

## 2020-06-22 LAB — CBC WITH DIFFERENTIAL/PLATELET
Absolute Monocytes: 492 cells/uL (ref 200–950)
Basophils Absolute: 41 cells/uL (ref 0–200)
Basophils Relative: 0.9 %
Eosinophils Absolute: 262 cells/uL (ref 15–500)
Eosinophils Relative: 5.7 %
HCT: 47.5 % (ref 38.5–50.0)
Hemoglobin: 16.3 g/dL (ref 13.2–17.1)
Lymphs Abs: 1012 cells/uL (ref 850–3900)
MCH: 31.1 pg (ref 27.0–33.0)
MCHC: 34.3 g/dL (ref 32.0–36.0)
MCV: 90.6 fL (ref 80.0–100.0)
MPV: 11.1 fL (ref 7.5–12.5)
Monocytes Relative: 10.7 %
Neutro Abs: 2792 cells/uL (ref 1500–7800)
Neutrophils Relative %: 60.7 %
Platelets: 143 10*3/uL (ref 140–400)
RBC: 5.24 10*6/uL (ref 4.20–5.80)
RDW: 12.1 % (ref 11.0–15.0)
Total Lymphocyte: 22 %
WBC: 4.6 10*3/uL (ref 3.8–10.8)

## 2020-06-22 LAB — BASIC METABOLIC PANEL WITH GFR
BUN/Creatinine Ratio: 41 (calc) — ABNORMAL HIGH (ref 6–22)
BUN: 35 mg/dL — ABNORMAL HIGH (ref 7–25)
CO2: 27 mmol/L (ref 20–32)
Calcium: 9.5 mg/dL (ref 8.6–10.3)
Chloride: 106 mmol/L (ref 98–110)
Creat: 0.86 mg/dL (ref 0.70–1.25)
GFR, Est African American: 104 mL/min/{1.73_m2} (ref >=60)
GFR, Est Non African American: 90 mL/min/{1.73_m2} (ref >=60)
Glucose, Bld: 81 mg/dL (ref 65–99)
Potassium: 4.5 mmol/L (ref 3.5–5.3)
Sodium: 143 mmol/L (ref 135–146)

## 2020-06-22 LAB — URINALYSIS, ROUTINE W REFLEX MICROSCOPIC
Bilirubin Urine: NEGATIVE
Glucose, UA: NEGATIVE
Hgb urine dipstick: NEGATIVE
Ketones, ur: NEGATIVE
Leukocytes,Ua: NEGATIVE
Nitrite: NEGATIVE
Protein, ur: NEGATIVE
Specific Gravity, Urine: 1.027 (ref 1.001–1.03)
pH: 5.5 (ref 5.0–8.0)

## 2020-06-22 LAB — TSH: TSH: 1.61 mIU/L (ref 0.40–4.50)

## 2020-06-22 LAB — HEPATIC FUNCTION PANEL
AG Ratio: 2.3 (calc) (ref 1.0–2.5)
ALT: 24 U/L (ref 9–46)
AST: 24 U/L (ref 10–35)
Albumin: 4.6 g/dL (ref 3.6–5.1)
Alkaline phosphatase (APISO): 61 U/L (ref 35–144)
Bilirubin, Direct: 0.2 mg/dL (ref 0.0–0.2)
Globulin: 2 g/dL (calc) (ref 1.9–3.7)
Indirect Bilirubin: 0.6 mg/dL (calc) (ref 0.2–1.2)
Total Bilirubin: 0.8 mg/dL (ref 0.2–1.2)
Total Protein: 6.6 g/dL (ref 6.1–8.1)

## 2020-06-22 LAB — VITAMIN B12: Vitamin B-12: 327 pg/mL (ref 200–1100)

## 2020-06-22 LAB — FOLATE: Folate: 20 ng/mL

## 2020-07-18 ENCOUNTER — Ambulatory Visit (INDEPENDENT_AMBULATORY_CARE_PROVIDER_SITE_OTHER): Payer: Medicare Other | Admitting: Psychology

## 2020-07-18 ENCOUNTER — Other Ambulatory Visit: Payer: Self-pay

## 2020-07-18 ENCOUNTER — Encounter (INDEPENDENT_AMBULATORY_CARE_PROVIDER_SITE_OTHER): Payer: Medicare Other | Admitting: Ophthalmology

## 2020-07-18 DIAGNOSIS — H43813 Vitreous degeneration, bilateral: Secondary | ICD-10-CM | POA: Diagnosis not present

## 2020-07-18 DIAGNOSIS — I10 Essential (primary) hypertension: Secondary | ICD-10-CM | POA: Diagnosis not present

## 2020-07-18 DIAGNOSIS — H35033 Hypertensive retinopathy, bilateral: Secondary | ICD-10-CM | POA: Diagnosis not present

## 2020-07-18 DIAGNOSIS — F419 Anxiety disorder, unspecified: Secondary | ICD-10-CM

## 2020-07-18 DIAGNOSIS — H348312 Tributary (branch) retinal vein occlusion, right eye, stable: Secondary | ICD-10-CM

## 2020-07-18 DIAGNOSIS — H34832 Tributary (branch) retinal vein occlusion, left eye, with macular edema: Secondary | ICD-10-CM

## 2020-07-18 DIAGNOSIS — H2513 Age-related nuclear cataract, bilateral: Secondary | ICD-10-CM | POA: Diagnosis not present

## 2020-08-09 DIAGNOSIS — D2271 Melanocytic nevi of right lower limb, including hip: Secondary | ICD-10-CM | POA: Diagnosis not present

## 2020-08-09 DIAGNOSIS — D2272 Melanocytic nevi of left lower limb, including hip: Secondary | ICD-10-CM | POA: Diagnosis not present

## 2020-08-09 DIAGNOSIS — L578 Other skin changes due to chronic exposure to nonionizing radiation: Secondary | ICD-10-CM | POA: Diagnosis not present

## 2020-08-09 DIAGNOSIS — B353 Tinea pedis: Secondary | ICD-10-CM | POA: Diagnosis not present

## 2020-08-09 DIAGNOSIS — L821 Other seborrheic keratosis: Secondary | ICD-10-CM | POA: Diagnosis not present

## 2020-08-09 DIAGNOSIS — D225 Melanocytic nevi of trunk: Secondary | ICD-10-CM | POA: Diagnosis not present

## 2020-08-09 DIAGNOSIS — L570 Actinic keratosis: Secondary | ICD-10-CM | POA: Diagnosis not present

## 2020-08-14 ENCOUNTER — Other Ambulatory Visit: Payer: Self-pay

## 2020-08-14 ENCOUNTER — Encounter (INDEPENDENT_AMBULATORY_CARE_PROVIDER_SITE_OTHER): Payer: Medicare Other | Admitting: Ophthalmology

## 2020-08-14 DIAGNOSIS — H34832 Tributary (branch) retinal vein occlusion, left eye, with macular edema: Secondary | ICD-10-CM

## 2020-08-14 DIAGNOSIS — H35033 Hypertensive retinopathy, bilateral: Secondary | ICD-10-CM | POA: Diagnosis not present

## 2020-08-14 DIAGNOSIS — H348312 Tributary (branch) retinal vein occlusion, right eye, stable: Secondary | ICD-10-CM | POA: Diagnosis not present

## 2020-08-14 DIAGNOSIS — I10 Essential (primary) hypertension: Secondary | ICD-10-CM

## 2020-08-14 DIAGNOSIS — H43813 Vitreous degeneration, bilateral: Secondary | ICD-10-CM | POA: Diagnosis not present

## 2020-08-23 ENCOUNTER — Ambulatory Visit: Payer: Medicare Other | Admitting: Psychology

## 2020-08-24 DIAGNOSIS — Z23 Encounter for immunization: Secondary | ICD-10-CM | POA: Diagnosis not present

## 2020-09-11 ENCOUNTER — Encounter (INDEPENDENT_AMBULATORY_CARE_PROVIDER_SITE_OTHER): Payer: Medicare Other | Admitting: Ophthalmology

## 2020-09-11 ENCOUNTER — Other Ambulatory Visit: Payer: Self-pay

## 2020-09-11 DIAGNOSIS — H34832 Tributary (branch) retinal vein occlusion, left eye, with macular edema: Secondary | ICD-10-CM | POA: Diagnosis not present

## 2020-09-11 DIAGNOSIS — H348312 Tributary (branch) retinal vein occlusion, right eye, stable: Secondary | ICD-10-CM | POA: Diagnosis not present

## 2020-09-11 DIAGNOSIS — H43813 Vitreous degeneration, bilateral: Secondary | ICD-10-CM | POA: Diagnosis not present

## 2020-09-11 DIAGNOSIS — I10 Essential (primary) hypertension: Secondary | ICD-10-CM

## 2020-09-11 DIAGNOSIS — H35033 Hypertensive retinopathy, bilateral: Secondary | ICD-10-CM

## 2020-09-20 ENCOUNTER — Ambulatory Visit: Payer: BLUE CROSS/BLUE SHIELD | Admitting: Psychology

## 2020-09-26 ENCOUNTER — Encounter: Payer: Self-pay | Admitting: Internal Medicine

## 2020-09-26 NOTE — Telephone Encounter (Signed)
I have called the pt to offer an open appointment tomorrow at 10.20am.

## 2020-09-27 ENCOUNTER — Encounter: Payer: Self-pay | Admitting: Internal Medicine

## 2020-09-27 ENCOUNTER — Other Ambulatory Visit: Payer: Self-pay

## 2020-09-27 ENCOUNTER — Ambulatory Visit (INDEPENDENT_AMBULATORY_CARE_PROVIDER_SITE_OTHER): Payer: Medicare Other | Admitting: Internal Medicine

## 2020-09-27 VITALS — BP 146/92 | HR 74 | Temp 98.1°F | Ht 70.0 in | Wt 170.0 lb

## 2020-09-27 DIAGNOSIS — N400 Enlarged prostate without lower urinary tract symptoms: Secondary | ICD-10-CM | POA: Diagnosis not present

## 2020-09-27 DIAGNOSIS — E785 Hyperlipidemia, unspecified: Secondary | ICD-10-CM | POA: Diagnosis not present

## 2020-09-27 DIAGNOSIS — I119 Hypertensive heart disease without heart failure: Secondary | ICD-10-CM | POA: Diagnosis not present

## 2020-09-27 DIAGNOSIS — I10 Essential (primary) hypertension: Secondary | ICD-10-CM | POA: Diagnosis not present

## 2020-09-27 LAB — BASIC METABOLIC PANEL
BUN: 20 mg/dL (ref 6–23)
CO2: 32 mEq/L (ref 19–32)
Calcium: 9.5 mg/dL (ref 8.4–10.5)
Chloride: 102 mEq/L (ref 96–112)
Creatinine, Ser: 0.84 mg/dL (ref 0.40–1.50)
GFR: 90.3 mL/min (ref >60.00)
Glucose, Bld: 91 mg/dL (ref 70–99)
Potassium: 4.1 mEq/L (ref 3.5–5.1)
Sodium: 139 mEq/L (ref 135–145)

## 2020-09-27 LAB — LIPID PANEL
Cholesterol: 152 mg/dL (ref 0–200)
HDL: 51 mg/dL (ref >39.00)
LDL Cholesterol: 83 mg/dL (ref 0–99)
NonHDL: 100.93
Total CHOL/HDL Ratio: 3
Triglycerides: 91 mg/dL (ref 0.0–149.0)
VLDL: 18.2 mg/dL (ref 0.0–40.0)

## 2020-09-27 LAB — URINALYSIS, ROUTINE W REFLEX MICROSCOPIC
Bilirubin Urine: NEGATIVE
Ketones, ur: NEGATIVE
Leukocytes,Ua: NEGATIVE
Nitrite: NEGATIVE
Specific Gravity, Urine: 1.015 (ref 1.000–1.030)
Total Protein, Urine: NEGATIVE
Urine Glucose: NEGATIVE
Urobilinogen, UA: 0.2 (ref 0.0–1.0)
WBC, UA: NONE SEEN (ref 0–?)
pH: 7 (ref 5.0–8.0)

## 2020-09-27 LAB — PSA: PSA: 0.58 ng/mL (ref 0.10–4.00)

## 2020-09-27 MED ORDER — IRBESARTAN 150 MG PO TABS: 150.0000 mg | ORAL_TABLET | Freq: Every day | ORAL | 1 refills | Status: DC

## 2020-09-27 MED ORDER — AMLODIPINE BESYLATE 10 MG PO TABS: 10.0000 mg | ORAL_TABLET | Freq: Every day | ORAL | 1 refills | Status: DC

## 2020-09-27 NOTE — Patient Instructions (Signed)

## 2020-09-27 NOTE — Progress Notes (Signed)
Subjective:  Patient ID: Timothy Bentley, male    DOB: 10/15/1950  Age: 69 y.o. MRN: [id number]  CC: Hypertension and Hyperlipidemia  This visit occurred during the SARS-CoV-2 public health emergency.  Safety protocols were in place, including screening questions prior to the visit, additional usage of staff PPE, and extensive cleaning of exam room while observing appropriate contact time as indicated for disinfecting solutions.    HPI Timothy Bentley presents for f/up - He is concerned that his blood pressure is not well controlled.  He says his systolic blood pressure at home is consistently around 150.  He tells me he is taking amlodipine 10 mg a day to try to control his blood pressure.  He decided not to take the ARB/thiazide diuretic combination.  He is diligent with his lifestyle modifications and denies any recent episodes of headache, blurred vision, dizziness, lightheadedness, chest pain, shortness of breath, or edema.  Outpatient Medications Prior to Visit  Medication Sig Dispense Refill  . rosuvastatin (CRESTOR) 5 MG tablet Take 1 tablet (5 mg total) by mouth daily. 90 tablet 1  . Azilsartan-Chlorthalidone (EDARBYCLOR) 40-12.5 MG TABS Take 1 tablet by mouth daily. 90 tablet 0   No facility-administered medications prior to visit.    ROS Review of Systems  Constitutional: Negative for chills, diaphoresis, fatigue and fever.  HENT: Negative.   Eyes: Negative.   Respiratory: Negative for cough, chest tightness, shortness of breath and wheezing.   Cardiovascular: Negative for chest pain, palpitations and leg swelling.  Gastrointestinal: Negative for abdominal pain, blood in stool, constipation, diarrhea, nausea and vomiting.  Endocrine: Negative.   Genitourinary: Negative.  Negative for difficulty urinating, dysuria, hematuria, penile swelling, scrotal swelling, testicular pain and urgency.  Musculoskeletal: Negative.  Negative for arthralgias and myalgias.  Skin: Negative.  Negative  for color change, pallor and rash.  Neurological: Negative.  Negative for dizziness, weakness, light-headedness, numbness and headaches.  Hematological: Negative for adenopathy. Does not bruise/bleed easily.  Psychiatric/Behavioral: Negative.     Objective:  BP (!) 146/92   Pulse 74   Temp 98.1 F (36.7 C) (Oral)   Ht 5\' 10"  (1.778 m)   Wt 170 lb (77.1 kg)   SpO2 96%   BMI 24.39 kg/m   BP Readings from Last 3 Encounters:  09/27/20 (!) 146/92  06/21/20 (!) 160/86  03/22/20 (!) 142/88    Wt Readings from Last 3 Encounters:  09/27/20 170 lb (77.1 kg)  06/21/20 163 lb 2 oz (74 kg)  03/22/20 155 lb (70.3 kg)    Physical Exam Vitals reviewed. Exam conducted with a chaperone present.  Constitutional:      Appearance: Normal appearance.  HENT:     Nose: Nose normal.     Mouth/Throat:     Mouth: Mucous membranes are moist.  Eyes:     General: No scleral icterus.    Conjunctiva/sclera: Conjunctivae normal.  Cardiovascular:     Rate and Rhythm: Normal rate and regular rhythm.     Heart sounds: No murmur heard.     Comments: EKG - NSR, 61 bpm No LVH Normal EKG Pulmonary:     Effort: Pulmonary effort is normal.     Breath sounds: No stridor. No wheezing, rhonchi or rales.  Abdominal:     General: Abdomen is flat. Bowel sounds are normal. There is no distension.     Palpations: Abdomen is soft. There is no hepatomegaly, splenomegaly or mass.     Tenderness: There is no abdominal tenderness.  Hernia: No hernia is present. There is no hernia in the left inguinal area or right inguinal area.  Genitourinary:    Pubic Area: No rash.      Penis: Normal and circumcised. No swelling or lesions.      Testes: Normal.        Right: Mass or tenderness not present.        Left: Mass or tenderness not present.     Epididymis:     Right: Normal.     Left: Normal.     Prostate: Enlarged (1+ smoooth symm BPH). Not tender and no nodules present.     Rectum: Guaiac result  negative. External hemorrhoid and internal hemorrhoid present. No mass, tenderness or anal fissure. Normal anal tone.  Musculoskeletal:        General: Normal range of motion.     Cervical back: Neck supple.     Right lower leg: No edema.     Left lower leg: No edema.  Lymphadenopathy:     Cervical: No cervical adenopathy.     Lower Body: No right inguinal adenopathy. No left inguinal adenopathy.  Skin:    General: Skin is warm and dry.     Coloration: Skin is not pale.  Neurological:     General: No focal deficit present.     Mental Status: He is alert and oriented to person, place, and time. Mental status is at baseline.  Psychiatric:        Mood and Affect: Mood normal.        Behavior: Behavior normal.        Thought Content: Thought content normal.     Lab Results  Component Value Date   WBC 4.6 06/21/2020   HGB 16.3 06/21/2020   HCT 47.5 06/21/2020   PLT 143 06/21/2020   GLUCOSE 81 06/21/2020   CHOL 151 08/01/2019   TRIG 88.0 08/01/2019   HDL 46.50 08/01/2019   LDLCALC 87 08/01/2019   ALT 24 06/21/2020   AST 24 06/21/2020   NA 143 06/21/2020   K 4.5 06/21/2020   CL 106 06/21/2020   CREATININE 0.86 06/21/2020   BUN 35 (H) 06/21/2020   CO2 27 06/21/2020   TSH 1.61 06/21/2020   PSA 0.49 07/28/2018   INR 1.02 10/10/2016   HGBA1C 4.9 12/20/2019    No results found.  Assessment & Plan:   Timothy Bentley was seen today for hypertension and hyperlipidemia.  Diagnoses and all orders for this visit:  Essential hypertension- His blood pressure is not adequately well controlled.  His EKG is normal.  Labs are negative for secondary causes or endorgan damage.  Will add an ARB to the CCB. -     Cancel: CBC with Differential/Platelet; Future -     Basic metabolic panel; Future -     Cancel: TSH; Future -     amLODipine (NORVASC) 10 MG tablet; Take 1 tablet (10 mg total) by mouth daily. -     EKG 12-Lead -     irbesartan (AVAPRO) 150 MG tablet; Take 1 tablet (150 mg total) by  mouth daily.  Hypertensive left ventricular hypertrophy, without heart failure- He has a normal volume status and a reassuring EKG.  Will try to get better control of his blood pressure. -     Basic metabolic panel; Future -     Cancel: TSH; Future -     amLODipine (NORVASC) 10 MG tablet; Take 1 tablet (10 mg total) by mouth daily. -  irbesartan (AVAPRO) 150 MG tablet; Take 1 tablet (150 mg total) by mouth daily.  Hyperlipidemia LDL goal <70- He has achieved his LDL goal and is doing well on the statin. -     Lipid panel; Future -     Cancel: TSH; Future -     Cancel: Hepatic function panel; Future  Benign prostatic hyperplasia without lower urinary tract symptoms- His PSA is low.  He has no symptoms that need to be treated. 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 I am also having him start on irbesartan. Additionally, I am having him maintain his rosuvastatin and amLODipine.  Meds ordered this encounter  Medications  . amLODipine (NORVASC) 10 MG tablet    Sig: Take 1 tablet (10 mg total) by mouth daily.    Dispense:  90 tablet    Refill:  1  . irbesartan (AVAPRO) 150 MG tablet    Sig: Take 1 tablet (150 mg total) by mouth daily.    Dispense:  90 tablet    Refill:  1     Follow-up: Return in about 6 months (around 03/28/2021).  Scarlette Calico, MD

## 2020-10-04 ENCOUNTER — Ambulatory Visit: Payer: BLUE CROSS/BLUE SHIELD | Admitting: Internal Medicine

## 2020-10-08 ENCOUNTER — Encounter: Payer: Self-pay | Admitting: Internal Medicine

## 2020-10-09 ENCOUNTER — Encounter (INDEPENDENT_AMBULATORY_CARE_PROVIDER_SITE_OTHER): Payer: Medicare Other | Admitting: Ophthalmology

## 2020-10-09 ENCOUNTER — Other Ambulatory Visit: Payer: Self-pay | Admitting: Internal Medicine

## 2020-10-09 DIAGNOSIS — I10 Essential (primary) hypertension: Secondary | ICD-10-CM

## 2020-10-10 ENCOUNTER — Other Ambulatory Visit: Payer: Self-pay

## 2020-10-10 ENCOUNTER — Encounter (INDEPENDENT_AMBULATORY_CARE_PROVIDER_SITE_OTHER): Payer: Medicare Other | Admitting: Ophthalmology

## 2020-10-10 DIAGNOSIS — I10 Essential (primary) hypertension: Secondary | ICD-10-CM

## 2020-10-10 DIAGNOSIS — H43813 Vitreous degeneration, bilateral: Secondary | ICD-10-CM

## 2020-10-10 DIAGNOSIS — H35033 Hypertensive retinopathy, bilateral: Secondary | ICD-10-CM | POA: Diagnosis not present

## 2020-10-10 DIAGNOSIS — H348312 Tributary (branch) retinal vein occlusion, right eye, stable: Secondary | ICD-10-CM | POA: Diagnosis not present

## 2020-10-10 DIAGNOSIS — H34832 Tributary (branch) retinal vein occlusion, left eye, with macular edema: Secondary | ICD-10-CM

## 2020-11-02 ENCOUNTER — Encounter: Payer: Self-pay | Admitting: Internal Medicine

## 2020-11-07 ENCOUNTER — Other Ambulatory Visit: Payer: Self-pay

## 2020-11-07 ENCOUNTER — Encounter (INDEPENDENT_AMBULATORY_CARE_PROVIDER_SITE_OTHER): Payer: Medicare Other | Admitting: Ophthalmology

## 2020-11-07 DIAGNOSIS — H348312 Tributary (branch) retinal vein occlusion, right eye, stable: Secondary | ICD-10-CM | POA: Diagnosis not present

## 2020-11-07 DIAGNOSIS — H43813 Vitreous degeneration, bilateral: Secondary | ICD-10-CM | POA: Diagnosis not present

## 2020-11-07 DIAGNOSIS — I10 Essential (primary) hypertension: Secondary | ICD-10-CM

## 2020-11-07 DIAGNOSIS — H35033 Hypertensive retinopathy, bilateral: Secondary | ICD-10-CM

## 2020-11-07 DIAGNOSIS — H34832 Tributary (branch) retinal vein occlusion, left eye, with macular edema: Secondary | ICD-10-CM

## 2020-11-28 ENCOUNTER — Other Ambulatory Visit: Payer: Self-pay

## 2020-11-28 ENCOUNTER — Ambulatory Visit (INDEPENDENT_AMBULATORY_CARE_PROVIDER_SITE_OTHER): Payer: Medicare Other

## 2020-11-28 VITALS — BP 118/60 | HR 71 | Temp 98.4°F | Ht 70.0 in | Wt 169.0 lb

## 2020-11-28 DIAGNOSIS — Z0000 Encounter for general adult medical examination without abnormal findings: Secondary | ICD-10-CM | POA: Diagnosis not present

## 2020-11-28 NOTE — Progress Notes (Addendum)
Subjective:   Timothy Bentley is a 70 y.o. male who presents for Medicare Annual/Subsequent preventive examination.  Review of Systems    No ROS. Medicare Wellness Visit. Additional risk factors are reflected in social history. Cardiac Risk Factors include: advanced age (>69men, >83 women);hypertension;male gender     Objective:    Today's Vitals   11/28/20 1443  BP: 118/60  Pulse: 71  Temp: 98.4 F (36.9 C)  SpO2: 96%  Weight: 169 lb (76.7 kg)  Height: 5\' 10"  (1.778 m)  PainSc: 0-No pain   Body mass index is 24.25 kg/m.  Advanced Directives 11/28/2020 06/27/2017 10/15/2016 10/10/2016 09/22/2016 07/02/2015  Does Patient Have a Medical Advance Directive? No No No No No No  Would patient like information on creating a medical advance directive? Yes (MAU/Ambulatory/Procedural Areas - Information given) - Yes (MAU/Ambulatory/Procedural Areas - Information given) Yes (MAU/Ambulatory/Procedural Areas - Information given) Yes (ED - Information included in AVS) No - patient declined information    Current Medications (verified) Outpatient Encounter Medications as of 11/28/2020  Medication Sig   amLODipine (NORVASC) 10 MG tablet Take 1 tablet (10 mg total) by mouth daily.   irbesartan (AVAPRO) 150 MG tablet Take 1 tablet (150 mg total) by mouth daily.   rosuvastatin (CRESTOR) 5 MG tablet Take 1 tablet (5 mg total) by mouth daily.   No facility-administered encounter medications on file as of 11/28/2020.    Allergies (verified) Patient has no known allergies.   History: Past Medical History:  Diagnosis Date   Anal condyloma    Arthritis    both hips   Diverticulosis    Dr Collene Mares   Heart murmur    Hepatitis C    Genotype 23 in 2000; seen @ [facility], treatment declined   History of kidney stones    Nephrolithiasis 2002   X 1   Thrombocytopenia ([place]) 2010   Platelet count 99,000   Past Surgical History:  Procedure Laterality Date   COLONOSCOPY  2004   Tics   excision  anal condyloma  02/05/12   Dr Zella Richer   FLEXIBLE SIGMOIDOSCOPY  2012   Dr Collene Mares for evaluation of venereal warts   TOTAL HIP ARTHROPLASTY Left 10/15/2016   Procedure: LEFT TOTAL HIP ARTHROPLASTY ANTERIOR APPROACH;  Surgeon: Gaynelle Arabian, MD;  Location: [facility];  Service: Orthopedics;  Laterality: Left;   venereal wart resection  2012   Dr Zella Richer   WISDOM TOOTH EXTRACTION     Family History  Problem Relation Age of Onset   Meniere's disease Father    Alcohol abuse Father    Coronary artery disease Father        S/P CABG , > 10   Lung cancer Maternal Uncle        smoker   Diabetes Neg Hx    Stroke Neg Hx    Colon cancer Neg Hx    Social History   Socioeconomic History   Marital status: Single    Spouse name: Not on file   Number of children: Not on file   Years of education: Not on file   Highest education level: Not on file  Occupational History   Occupation: [employer]  Tobacco Use   Smoking status: Never Smoker   Smokeless tobacco: Never Used  Substance and Sexual Activity   Alcohol use: No    Alcohol/week: 0.0 standard drinks   Drug use: No    Comment: History-smoked pot    Sexual activity: Not on file  Other Topics Concern   Not on file  Social History Narrative   Regular exercise- yes, tennis,yoga,running, swimming, weights         Social Determinants of Health   Financial Resource Strain: Low Risk    Difficulty of Paying Living Expenses: Not hard at all  Food Insecurity: No Food Insecurity   Worried About Charity fundraiser in the Last Year: Never true   [place] in the Last Year: Never true  Transportation Needs: No Transportation Needs   Lack of Transportation (Medical): No   Lack of Transportation (Non-Medical): No  Physical Activity: Sufficiently Active   Days of Exercise per Week: 5 days   Minutes of Exercise per Session: 30 min  Stress: No Stress Concern Present   Feeling of Stress : Not at all  Social Connections: Moderately  Integrated   Frequency of Communication with Friends and Family: More than three times a week   Frequency of Social Gatherings with Friends and Family: More than three times a week   Attends Religious Services: More than 4 times per year   Active Member of [employer] or Organizations: No   Attends Music therapist: More than 4 times per year   Marital Status: Never married    Tobacco Counseling Counseling given: Not Answered   Clinical Intake:  Pre-visit preparation completed: Yes  Pain : No/denies pain Pain Score: 0-No pain     BMI - recorded: 24.25 Nutritional Status: BMI of 19-24  Normal Nutritional Risks: None Diabetes: No  How often do you need to have someone help you when you read instructions, pamphlets, or other written materials from your doctor or pharmacy?: 1 - Never What is the last grade level you completed in school?: Master's Degree  Diabetic? no  Interpreter Needed?: No  Information entered by :: Lisette Abu, LPN   Activities of Daily Living In your present state of health, do you have any difficulty performing the following activities: 11/28/2020  Hearing? N  Vision? N  Difficulty concentrating or making decisions? N  Walking or climbing stairs? N  Dressing or bathing? N  Doing errands, shopping? N  Preparing Food and eating ? N  Using the Toilet? N  In the past six months, have you accidently leaked urine? N  Do you have problems with loss of bowel control? N  Managing your Medications? N  Managing your Finances? N  Housekeeping or managing your Housekeeping? N  Some recent data might be hidden    Patient Care Team: Hoyt Koch, MD as PCP - General (Internal Medicine)  Indicate any recent Medical Services you may have received from other than [facility] providers in the past year (date may be approximate).     Assessment:   This is a routine wellness examination for [name].  Hearing/Vision screen No exam data  present  Dietary issues and exercise activities discussed: Current Exercise Habits: Home exercise routine;Structured exercise class, Type of exercise: walking;stretching;strength training/weights;Other - see comments (Works on tennis courts), Time (Minutes): 30, Frequency (Times/Week): 5, Weekly Exercise (Minutes/Week): 150, Intensity: Moderate, Exercise limited by: None identified  Goals      Patient Stated     To maintain my current health status by continuing to eat healthy, stay physically active and socially active.       Depression Screen PHQ 2/9 Scores 11/28/2020 08/01/2019 07/28/2018 12/02/2016 09/17/2016 05/11/2013  PHQ - 2 Score 0 0 0 0 0 0    Fall Risk Fall Risk  11/28/2020 08/01/2019 07/28/2018 12/02/2016 09/17/2016  Falls in the past year? 0 0 No No No  Number falls in past yr: 0 - - - -  Injury with Fall? 0 - - - -  Risk for fall due to : No Fall Risks - - - -    FALL RISK PREVENTION PERTAINING TO THE HOME:  Any stairs in or around the home? Yes  If so, are there any without handrails? No  Home free of loose throw rugs in walkways, pet beds, electrical cords, etc? Yes  Adequate lighting in your home to reduce risk of falls? Yes   ASSISTIVE DEVICES UTILIZED TO PREVENT FALLS:  Life alert? No  Use of a cane, walker or w/c? No  Grab bars in the bathroom? No  Shower chair or bench in shower? No  Elevated toilet seat or a handicapped toilet? Yes   TIMED UP AND GO:  Was the test performed? No .  Length of time to ambulate 10 feet: 0 sec.   Gait steady and fast without use of assistive device   Cognitive Function: Normal cognitive status assessed by direct observation by this Nurse Health Advisor. No abnormalities found.          Immunizations Immunization History  Administered Date(s) Administered   Fluad Quad(high Dose 65+) 08/01/2019, 06/21/2020   Hepatitis B, adult 01/07/2017, 02/11/2017, 07/08/2017   Influenza,inj,Quad PF,6+ Mos 07/08/2017    Influenza-Unspecified 07/07/2015, 07/22/2017, 07/27/2018   Pneumococcal Conjugate-13 07/28/2018   Pneumococcal Polysaccharide-23 08/01/2019   Td 10/06/2004   Tdap 11/22/2013    TDAP status: Up to date  Flu Vaccine status: Up to date  Pneumococcal vaccine status: Up to date  Covid-19 vaccine status: Completed vaccines  Qualifies for Shingles Vaccine? Yes   Zostavax completed No   Shingrix Completed?: No.    Education has been provided regarding the importance of this vaccine. Patient has been advised to call insurance company to determine out of pocket expense if they have not yet received this vaccine. Advised may also receive vaccine at local pharmacy or Health Dept. Verbalized acceptance and understanding.  Screening Tests Health Maintenance  Topic Date Due   COVID-19 Vaccine (1) Never done   TETANUS/TDAP  11/23/2023   COLONOSCOPY (Pts 45-43yrs Insurance coverage will need to be confirmed)  09/09/2025   INFLUENZA VACCINE  Completed   Hepatitis C Screening  Completed   PNA vac Low Risk Adult  Completed    Health Maintenance  Health Maintenance Due  Topic Date Due   COVID-19 Vaccine (1) Never done    Colorectal cancer screening: Type of screening: Colonoscopy. Completed 09/10/2015. Repeat every 10 years  Lung Cancer Screening: (Low Dose CT Chest recommended if Age 65-80 years, 30 pack-year currently smoking OR have quit w/in 15years.) does not qualify.   Lung Cancer Screening Referral: no  Additional Screening:  Hepatitis C Screening: does qualify; Completed yes  Vision Screening: Recommended annual ophthalmology exams for early detection of glaucoma and other disorders of the eye. Is the patient up to date with their annual eye exam?  Yes  Who is the provider or what is the name of the office in which the patient attends annual eye exams? [facility] If pt is not established with a provider, would they like to be referred to a provider to establish care? No .    Dental Screening: Recommended annual dental exams for proper oral hygiene  Community Resource Referral / Chronic Care Management: CRR required this visit?  No  CCM required this visit?  No      Plan:     I have personally reviewed and noted the following in the patient's chart:   Medical and social history Use of alcohol, tobacco or illicit drugs  Current medications and supplements Functional ability and status Nutritional status Physical activity Advanced directives List of other physicians Hospitalizations, surgeries, and ER visits in previous 12 months Vitals Screenings to include cognitive, depression, and falls Referrals and appointments  In addition, I have reviewed and discussed with patient certain preventive protocols, quality metrics, and best practice recommendations. A written personalized care plan for preventive services as well as general preventive health recommendations were provided to patient.     Sheral Flow, LPN   05/25/8137   Nurse Notes:  Medications reviewed with patient; no opioid use noted.  Medical screening examination/treatment/procedure(s) were performed by non-physician practitioner and as supervising physician I was immediately available for consultation/collaboration.  I agree with above. Lew Dawes, MD

## 2020-11-28 NOTE — Patient Instructions (Addendum)
[name] , Thank you for taking time to come for your Medicare Wellness Visit. I appreciate your ongoing commitment to your health goals. Please review the following plan we discussed and let me know if I can assist you in the future.   Screening recommendations/referrals: Colonoscopy: 09/10/2015; due every 10 years (due 09/2025) Recommended yearly ophthalmology/optometry visit for glaucoma screening and checkup Recommended yearly dental visit for hygiene and checkup  Vaccinations: Influenza vaccine: 06/21/2020 Pneumococcal vaccine: 07/28/2018, 08/01/2019 (up to date) Tdap vaccine: 11/22/2013; due every 10 years (due 11/2023) Shingles vaccine: never done; can check with local pharmacy for vaccine and pricing.   Covid-19: 12/05/2019, 01/03/2020, 08/24/2020  Advanced directives: Advance directive discussed with you today. I have provided a copy for you to complete at home and have notarized. Once this is complete please bring a copy in to our office so we can scan it into your chart.  Conditions/risks identified: Yes; Reviewed health maintenance screenings with patient today and relevant education, vaccines, and/or referrals were provided. Please continue to do your personal lifestyle choices by: daily care of teeth and gums, regular physical activity (goal should be 5 days a week for 30 minutes), eat a healthy diet, avoid tobacco and drug use, limiting any alcohol intake, taking a low-dose aspirin (if not allergic or have been advised by your provider otherwise) and taking vitamins and minerals as recommended by your provider. Continue doing brain stimulating activities (puzzles, reading, adult coloring books, staying active) to keep memory sharp. Continue to eat heart healthy diet (full of fruits, vegetables, whole grains, lean protein, water--limit salt, fat, and sugar intake) and increase physical activity as tolerated.  Next appointment: Please schedule your next Medicare Wellness Visit with your  Nurse Health Advisor in 1 year by calling [phone].  Preventive Care 68 Years and Older, Male Preventive care refers to lifestyle choices and visits with your health care provider that can promote health and wellness. What does preventive care include?  A yearly physical exam. This is also called an annual well check.  Dental exams once or twice a year.  Routine eye exams. Ask your health care provider how often you should have your eyes checked.  Personal lifestyle choices, including:  Daily care of your teeth and gums.  Regular physical activity.  Eating a healthy diet.  Avoiding tobacco and drug use.  Limiting alcohol use.  Practicing safe sex.  Taking low doses of aspirin every day.  Taking vitamin and mineral supplements as recommended by your health care provider. What happens during an annual well check? The services and screenings done by your health care provider during your annual well check will depend on your age, overall health, lifestyle risk factors, and family history of disease. Counseling  Your health care provider may ask you questions about your:  Alcohol use.  Tobacco use.  Drug use.  Emotional well-being.  Home and relationship well-being.  Sexual activity.  Eating habits.  History of falls.  Memory and ability to understand (cognition).  Work and work Statistician. Screening  You may have the following tests or measurements:  Height, weight, and BMI.  Blood pressure.  Lipid and cholesterol levels. These may be checked every 5 years, or more frequently if you are over 12 years old.  Skin check.  Lung cancer screening. You may have this screening every year starting at age 68 if you have a 30-pack-year history of smoking and currently smoke or have quit within the past 15 years.  Fecal occult blood  test (FOBT) of the stool. You may have this test every year starting at age 68.  Flexible sigmoidoscopy or colonoscopy. You may  have a sigmoidoscopy every 5 years or a colonoscopy every 10 years starting at age 68.  Prostate cancer screening. Recommendations will vary depending on your family history and other risks.  Hepatitis C blood test.  Hepatitis B blood test.  Sexually transmitted disease (STD) testing.  Diabetes screening. This is done by checking your blood sugar (glucose) after you have not eaten for a while (fasting). You may have this done every 1-3 years.  Abdominal aortic aneurysm (AAA) screening. You may need this if you are a current or former smoker.  Osteoporosis. You may be screened starting at age 68 if you are at high risk. Talk with your health care provider about your test results, treatment options, and if necessary, the need for more tests. Vaccines  Your health care provider may recommend certain vaccines, such as:  Influenza vaccine. This is recommended every year.  Tetanus, diphtheria, and acellular pertussis (Tdap, Td) vaccine. You may need a Td booster every 10 years.  Zoster vaccine. You may need this after age 68.  Pneumococcal 13-valent conjugate (PCV13) vaccine. One dose is recommended after age 15.  Pneumococcal polysaccharide (PPSV23) vaccine. One dose is recommended after age 42. Talk to your health care provider about which screenings and vaccines you need and how often you need them. This information is not intended to replace advice given to you by your health care provider. Make sure you discuss any questions you have with your health care provider. Document Released: 10/19/2015 Document Revised: 06/11/2016 Document Reviewed: 07/24/2015 Elsevier Interactive Patient Education  2017 [place] Prevention in the Home Falls can cause injuries. They can happen to people of all ages. There are many things you can do to make your home safe and to help prevent falls. What can I do on the outside of my home?  Regularly fix the edges of walkways and driveways and  fix any cracks.  Remove anything that might make you trip as you walk through a door, such as a raised step or threshold.  Trim any bushes or trees on the path to your home.  Use bright outdoor lighting.  Clear any walking paths of anything that might make someone trip, such as rocks or tools.  Regularly check to see if handrails are loose or broken. Make sure that both sides of any steps have handrails.  Any raised decks and porches should have guardrails on the edges.  Have any leaves, snow, or ice cleared regularly.  Use sand or salt on walking paths during winter.  Clean up any spills in your garage right away. This includes oil or grease spills. What can I do in the bathroom?  Use night lights.  Install grab bars by the toilet and in the tub and shower. Do not use towel bars as grab bars.  Use non-skid mats or decals in the tub or shower.  If you need to sit down in the shower, use a plastic, non-slip stool.  Keep the floor dry. Clean up any water that spills on the floor as soon as it happens.  Remove soap buildup in the tub or shower regularly.  Attach bath mats securely with double-sided non-slip rug tape.  Do not have throw rugs and other things on the floor that can make you trip. What can I do in the bedroom?  Use night lights.  Make sure that you have a light by your bed that is easy to reach.  Do not use any sheets or blankets that are too big for your bed. They should not hang down onto the floor.  Have a firm chair that has side arms. You can use this for support while you get dressed.  Do not have throw rugs and other things on the floor that can make you trip. What can I do in the kitchen?  Clean up any spills right away.  Avoid walking on wet floors.  Keep items that you use a lot in easy-to-reach places.  If you need to reach something above you, use a strong step stool that has a grab bar.  Keep electrical cords out of the way.  Do not  use floor polish or wax that makes floors slippery. If you must use wax, use non-skid floor wax.  Do not have throw rugs and other things on the floor that can make you trip. What can I do with my stairs?  Do not leave any items on the stairs.  Make sure that there are handrails on both sides of the stairs and use them. Fix handrails that are broken or loose. Make sure that handrails are as long as the stairways.  Check any carpeting to make sure that it is firmly attached to the stairs. Fix any carpet that is loose or worn.  Avoid having throw rugs at the top or bottom of the stairs. If you do have throw rugs, attach them to the floor with carpet tape.  Make sure that you have a light switch at the top of the stairs and the bottom of the stairs. If you do not have them, ask someone to add them for you. What else can I do to help prevent falls?  Wear shoes that:  Do not have high heels.  Have rubber bottoms.  Are comfortable and fit you well.  Are closed at the toe. Do not wear sandals.  If you use a stepladder:  Make sure that it is fully opened. Do not climb a closed stepladder.  Make sure that both sides of the stepladder are locked into place.  Ask someone to hold it for you, if possible.  Clearly [place] and make sure that you can see:  Any grab bars or handrails.  First and last steps.  Where the edge of each step is.  Use tools that help you move around (mobility aids) if they are needed. These include:  Canes.  Walkers.  Scooters.  Crutches.  Turn on the lights when you go into a dark area. Replace any light bulbs as soon as they burn out.  Set up your furniture so you have a clear path. Avoid moving your furniture around.  If any of your floors are uneven, fix them.  If there are any pets around you, be aware of where they are.  Review your medicines with your doctor. Some medicines can make you feel dizzy. This can increase your chance of  falling. Ask your doctor what other things that you can do to help prevent falls. This information is not intended to replace advice given to you by your health care provider. Make sure you discuss any questions you have with your health care provider. Document Released: 07/19/2009 Document Revised: 02/28/2016 Document Reviewed: 10/27/2014 Elsevier Interactive Patient Education  2017 [employer].

## 2020-12-05 ENCOUNTER — Encounter (INDEPENDENT_AMBULATORY_CARE_PROVIDER_SITE_OTHER): Payer: Medicare Other | Admitting: Ophthalmology

## 2020-12-05 ENCOUNTER — Other Ambulatory Visit: Payer: Self-pay

## 2020-12-05 ENCOUNTER — Telehealth: Payer: Self-pay | Admitting: Internal Medicine

## 2020-12-05 DIAGNOSIS — H43813 Vitreous degeneration, bilateral: Secondary | ICD-10-CM | POA: Diagnosis not present

## 2020-12-05 DIAGNOSIS — H34832 Tributary (branch) retinal vein occlusion, left eye, with macular edema: Secondary | ICD-10-CM

## 2020-12-05 DIAGNOSIS — H5462 Unqualified visual loss, left eye, normal vision right eye: Secondary | ICD-10-CM

## 2020-12-05 DIAGNOSIS — H35033 Hypertensive retinopathy, bilateral: Secondary | ICD-10-CM | POA: Diagnosis not present

## 2020-12-05 DIAGNOSIS — I10 Essential (primary) hypertension: Secondary | ICD-10-CM | POA: Diagnosis not present

## 2020-12-05 DIAGNOSIS — H348312 Tributary (branch) retinal vein occlusion, right eye, stable: Secondary | ICD-10-CM | POA: Diagnosis not present

## 2020-12-05 DIAGNOSIS — H35372 Puckering of macula, left eye: Secondary | ICD-10-CM

## 2020-12-05 NOTE — Telephone Encounter (Signed)
[facility] called and requesting a call back from [place] to speak about patients condition.   Timothy Bentley [phone]

## 2020-12-06 NOTE — Telephone Encounter (Signed)
Have ordered the evaluation of the heart and carotids to be done. I would recommend patient come in for a visit to discuss lowering risk for stroke.

## 2020-12-06 NOTE — Telephone Encounter (Signed)
Spoke with Dr. 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 His left eye has been treated and they were able to recover most of his vision. Dr. Ivan Croft stated he needs to have his carotid arteries and heart valve evaluated. He also mentioned the patient has plaque in the artery of his left eye. His is at high risk for stroke and he has Hep C as well.

## 2020-12-07 ENCOUNTER — Other Ambulatory Visit: Payer: Self-pay | Admitting: Internal Medicine

## 2020-12-07 DIAGNOSIS — I10 Essential (primary) hypertension: Secondary | ICD-10-CM

## 2020-12-07 NOTE — Telephone Encounter (Signed)
Patient called back, how soon does he need a follow up? He has some questions as well

## 2020-12-07 NOTE — Telephone Encounter (Signed)
Unable to get in contact with the patient. LVM asking him to return my call so that I could schedule a follow up appointment with Dr. Sharlet Salina. Office number was provided.    If patient calls back please schedule a follow up visit with Dr. Sharlet Salina to discuss lowering his risk for stroke.

## 2020-12-12 ENCOUNTER — Encounter: Payer: Self-pay | Admitting: Internal Medicine

## 2020-12-12 ENCOUNTER — Other Ambulatory Visit: Payer: Self-pay

## 2020-12-12 ENCOUNTER — Ambulatory Visit (INDEPENDENT_AMBULATORY_CARE_PROVIDER_SITE_OTHER): Payer: Medicare Other | Admitting: Internal Medicine

## 2020-12-12 DIAGNOSIS — I10 Essential (primary) hypertension: Secondary | ICD-10-CM

## 2020-12-12 NOTE — Patient Instructions (Signed)
We will get the ultrasound of the heart and the neck to check.

## 2020-12-12 NOTE — Progress Notes (Signed)
   Subjective:   Patient ID: Timothy Bentley, male    DOB: Mar 03, 1951, 70 y.o.   MRN: [id number]  HPI The patient is a 70 YO man with a new branch vein occulusion in the eye recently with eye doctor and they mentioned to him that this was associated with high BP. They have also recommended that we monitor his BP and order US carotid and ECHO. These are scheduled for later this month. Overall his vision is stable. BP is stable now on 2 medications and he is also continuing with exercise which he started to help with BP. Denies chest pains or headaches.   Review of Systems  Constitutional: Negative.   HENT: Negative.   Eyes: Positive for visual disturbance.  Respiratory: Negative for cough, chest tightness and shortness of breath.   Cardiovascular: Negative for chest pain, palpitations and leg swelling.  Gastrointestinal: Negative for abdominal distention, abdominal pain, constipation, diarrhea, nausea and vomiting.  Musculoskeletal: Negative.   Skin: Negative.   Neurological: Negative.   Psychiatric/Behavioral: Negative.     Objective:  Physical Exam Constitutional:      Appearance: He is well-developed.  HENT:     Head: Normocephalic and atraumatic.  Cardiovascular:     Rate and Rhythm: Normal rate and regular rhythm.  Pulmonary:     Effort: Pulmonary effort is normal. No respiratory distress.     Breath sounds: Normal breath sounds. No wheezing or rales.  Abdominal:     General: Bowel sounds are normal. There is no distension.     Palpations: Abdomen is soft.     Tenderness: There is no abdominal tenderness. There is no rebound.  Musculoskeletal:     Cervical back: Normal range of motion.  Skin:    General: Skin is warm and dry.  Neurological:     Mental Status: He is alert and oriented to person, place, and time.     Coordination: Coordination normal.     Vitals:   12/12/20 1055  BP: 140/70  Pulse: 62  Resp: 18  Temp: 98.3 F (36.8 C)  TempSrc: Oral  SpO2: 98%   Weight: 168 lb (76.2 kg)  Height: 5\' 10"  (1.778 m)    This visit occurred during the SARS-CoV-2 public health emergency.  Safety protocols were in place, including screening questions prior to the visit, additional usage of staff PPE, and extensive cleaning of exam room while observing appropriate contact time as indicated for disinfecting solutions.   Assessment & Plan:

## 2020-12-14 NOTE — Assessment & Plan Note (Signed)
BP at goal on irbesartan and amlodipine. Given the new finding in the eye we have ordered US carotid and ECHO and these will be done later this month.

## 2021-01-01 ENCOUNTER — Other Ambulatory Visit: Payer: Self-pay

## 2021-01-01 ENCOUNTER — Ambulatory Visit (HOSPITAL_BASED_OUTPATIENT_CLINIC_OR_DEPARTMENT_OTHER): Payer: Medicare Other

## 2021-01-01 ENCOUNTER — Ambulatory Visit (HOSPITAL_COMMUNITY)
Admission: RE | Admit: 2021-01-01 | Discharge: 2021-01-01 | Disposition: A | Discharge status: Home / Self Care | Payer: Medicare Other | Source: Ambulatory Visit | Attending: Internal Medicine | Admitting: Internal Medicine

## 2021-01-01 DIAGNOSIS — H348312 Tributary (branch) retinal vein occlusion, right eye, stable: Secondary | ICD-10-CM

## 2021-01-01 DIAGNOSIS — I10 Essential (primary) hypertension: Secondary | ICD-10-CM | POA: Insufficient documentation

## 2021-01-01 DIAGNOSIS — H5462 Unqualified visual loss, left eye, normal vision right eye: Secondary | ICD-10-CM | POA: Insufficient documentation

## 2021-01-01 LAB — ECHOCARDIOGRAM COMPLETE
Area-P 1/2: 3.19 cm2
S' Lateral: 3 cm

## 2021-01-02 ENCOUNTER — Encounter (INDEPENDENT_AMBULATORY_CARE_PROVIDER_SITE_OTHER): Payer: Medicare Other | Admitting: Ophthalmology

## 2021-01-02 DIAGNOSIS — H2513 Age-related nuclear cataract, bilateral: Secondary | ICD-10-CM

## 2021-01-02 DIAGNOSIS — H34832 Tributary (branch) retinal vein occlusion, left eye, with macular edema: Secondary | ICD-10-CM | POA: Diagnosis not present

## 2021-01-02 DIAGNOSIS — H348312 Tributary (branch) retinal vein occlusion, right eye, stable: Secondary | ICD-10-CM | POA: Diagnosis not present

## 2021-01-02 DIAGNOSIS — H43813 Vitreous degeneration, bilateral: Secondary | ICD-10-CM | POA: Diagnosis not present

## 2021-01-02 DIAGNOSIS — H35033 Hypertensive retinopathy, bilateral: Secondary | ICD-10-CM

## 2021-01-02 DIAGNOSIS — H35372 Puckering of macula, left eye: Secondary | ICD-10-CM

## 2021-01-02 DIAGNOSIS — I10 Essential (primary) hypertension: Secondary | ICD-10-CM

## 2021-01-30 ENCOUNTER — Encounter (INDEPENDENT_AMBULATORY_CARE_PROVIDER_SITE_OTHER): Payer: Medicare Other | Admitting: Ophthalmology

## 2021-01-30 ENCOUNTER — Other Ambulatory Visit: Payer: Self-pay

## 2021-01-30 DIAGNOSIS — H2513 Age-related nuclear cataract, bilateral: Secondary | ICD-10-CM | POA: Diagnosis not present

## 2021-01-30 DIAGNOSIS — H34832 Tributary (branch) retinal vein occlusion, left eye, with macular edema: Secondary | ICD-10-CM

## 2021-01-30 DIAGNOSIS — H43813 Vitreous degeneration, bilateral: Secondary | ICD-10-CM

## 2021-01-30 DIAGNOSIS — H348312 Tributary (branch) retinal vein occlusion, right eye, stable: Secondary | ICD-10-CM

## 2021-01-30 DIAGNOSIS — H35033 Hypertensive retinopathy, bilateral: Secondary | ICD-10-CM

## 2021-01-30 DIAGNOSIS — I10 Essential (primary) hypertension: Secondary | ICD-10-CM | POA: Diagnosis not present

## 2021-01-30 DIAGNOSIS — H34212 Partial retinal artery occlusion, left eye: Secondary | ICD-10-CM | POA: Diagnosis not present

## 2021-02-04 ENCOUNTER — Encounter (INDEPENDENT_AMBULATORY_CARE_PROVIDER_SITE_OTHER): Payer: Medicare Other | Admitting: Ophthalmology

## 2021-02-18 ENCOUNTER — Other Ambulatory Visit: Payer: Self-pay | Admitting: Internal Medicine

## 2021-02-18 DIAGNOSIS — I119 Hypertensive heart disease without heart failure: Secondary | ICD-10-CM

## 2021-02-18 DIAGNOSIS — I10 Essential (primary) hypertension: Secondary | ICD-10-CM

## 2021-02-26 DIAGNOSIS — Z23 Encounter for immunization: Secondary | ICD-10-CM | POA: Diagnosis not present

## 2021-02-27 ENCOUNTER — Other Ambulatory Visit: Payer: Self-pay

## 2021-02-27 ENCOUNTER — Encounter (INDEPENDENT_AMBULATORY_CARE_PROVIDER_SITE_OTHER): Payer: Medicare Other | Admitting: Ophthalmology

## 2021-02-27 DIAGNOSIS — H34212 Partial retinal artery occlusion, left eye: Secondary | ICD-10-CM

## 2021-02-27 DIAGNOSIS — H35372 Puckering of macula, left eye: Secondary | ICD-10-CM | POA: Diagnosis not present

## 2021-02-27 DIAGNOSIS — H43823 Vitreomacular adhesion, bilateral: Secondary | ICD-10-CM | POA: Diagnosis not present

## 2021-02-27 DIAGNOSIS — H34832 Tributary (branch) retinal vein occlusion, left eye, with macular edema: Secondary | ICD-10-CM | POA: Diagnosis not present

## 2021-02-27 DIAGNOSIS — I10 Essential (primary) hypertension: Secondary | ICD-10-CM

## 2021-02-27 DIAGNOSIS — H348312 Tributary (branch) retinal vein occlusion, right eye, stable: Secondary | ICD-10-CM | POA: Diagnosis not present

## 2021-02-27 DIAGNOSIS — H35033 Hypertensive retinopathy, bilateral: Secondary | ICD-10-CM

## 2021-03-27 ENCOUNTER — Other Ambulatory Visit: Payer: Self-pay

## 2021-03-27 ENCOUNTER — Encounter (INDEPENDENT_AMBULATORY_CARE_PROVIDER_SITE_OTHER): Payer: Medicare Other | Admitting: Ophthalmology

## 2021-03-27 DIAGNOSIS — H35033 Hypertensive retinopathy, bilateral: Secondary | ICD-10-CM

## 2021-03-27 DIAGNOSIS — H43813 Vitreous degeneration, bilateral: Secondary | ICD-10-CM | POA: Diagnosis not present

## 2021-03-27 DIAGNOSIS — I10 Essential (primary) hypertension: Secondary | ICD-10-CM | POA: Diagnosis not present

## 2021-03-27 DIAGNOSIS — H34832 Tributary (branch) retinal vein occlusion, left eye, with macular edema: Secondary | ICD-10-CM | POA: Diagnosis not present

## 2021-04-16 ENCOUNTER — Other Ambulatory Visit: Payer: Self-pay

## 2021-04-16 ENCOUNTER — Encounter: Payer: Self-pay | Admitting: Internal Medicine

## 2021-04-16 ENCOUNTER — Ambulatory Visit (INDEPENDENT_AMBULATORY_CARE_PROVIDER_SITE_OTHER): Payer: Medicare Other | Admitting: Internal Medicine

## 2021-04-16 DIAGNOSIS — I10 Essential (primary) hypertension: Secondary | ICD-10-CM | POA: Diagnosis not present

## 2021-04-16 DIAGNOSIS — R202 Paresthesia of skin: Secondary | ICD-10-CM

## 2021-04-16 DIAGNOSIS — I119 Hypertensive heart disease without heart failure: Secondary | ICD-10-CM

## 2021-04-16 DIAGNOSIS — R200 Anesthesia of skin: Secondary | ICD-10-CM | POA: Insufficient documentation

## 2021-04-16 MED ORDER — AMLODIPINE BESYLATE 10 MG PO TABS: 10.0000 mg | ORAL_TABLET | Freq: Every day | ORAL | 1 refills | Status: DC

## 2021-04-16 NOTE — Patient Instructions (Addendum)
Try a carpal tunnel brace for the left arm to see if this helps wearing at night time.

## 2021-04-16 NOTE — Progress Notes (Signed)
   Subjective:   Patient ID: Timothy Bentley, male    DOB: 16-Oct-1950, 70 y.o.   MRN: [id number]  HPI The patient is a 70 YO man coming in for concerns about numbness left arm/hand with certain positions. Mostly with sleeping or working out. Denies weakness. Clears with shaking out arm within a minute.   Review of Systems  Constitutional: Negative.   HENT: Negative.    Eyes: Negative.   Respiratory:  Negative for cough, chest tightness and shortness of breath.   Cardiovascular:  Negative for chest pain, palpitations and leg swelling.  Gastrointestinal:  Negative for abdominal distention, abdominal pain, constipation, diarrhea, nausea and vomiting.  Musculoskeletal: Negative.   Skin: Negative.   Neurological:  Positive for numbness.  Psychiatric/Behavioral: Negative.     Objective:  Physical Exam Constitutional:      Appearance: He is well-developed.  HENT:     Head: Normocephalic and atraumatic.  Cardiovascular:     Rate and Rhythm: Normal rate and regular rhythm.     Comments: Good pulses at the wrist  Pulmonary:     Effort: Pulmonary effort is normal. No respiratory distress.     Breath sounds: Normal breath sounds. No wheezing or rales.  Abdominal:     General: Bowel sounds are normal. There is no distension.     Palpations: Abdomen is soft.     Tenderness: There is no abdominal tenderness. There is no rebound.  Musculoskeletal:     Cervical back: Normal range of motion.  Skin:    General: Skin is warm and dry.  Neurological:     Mental Status: He is alert and oriented to person, place, and time.     Coordination: Coordination normal.    Vitals:   04/16/21 1358  BP: 122/72  Pulse: 76  Resp: 18  Temp: 98.8 F (37.1 C)  TempSrc: Oral  SpO2: 96%  Weight: 168 lb 9.6 oz (76.5 kg)  Height: 5\' 10"  (1.778 m)    This visit occurred during the SARS-CoV-2 public health emergency.  Safety protocols were in place, including screening questions prior to the visit, additional  usage of staff PPE, and extensive cleaning of exam room while observing appropriate contact time as indicated for disinfecting solutions.   Assessment & Plan:

## 2021-04-16 NOTE — Assessment & Plan Note (Signed)
Suspect carpal tunnel and advised buy otc brace and use at night time to see if this helps symptoms.

## 2021-04-16 NOTE — Assessment & Plan Note (Signed)
Refilled amlodipine 10 mg daily and will continue along with irbesartan 150 mg daily. He will follow up 3-4 months and if BP still doing well will consider medication change.

## 2021-04-24 ENCOUNTER — Encounter (INDEPENDENT_AMBULATORY_CARE_PROVIDER_SITE_OTHER): Payer: Medicare Other | Admitting: Ophthalmology

## 2021-04-24 ENCOUNTER — Other Ambulatory Visit: Payer: Self-pay

## 2021-04-24 DIAGNOSIS — H35033 Hypertensive retinopathy, bilateral: Secondary | ICD-10-CM

## 2021-04-24 DIAGNOSIS — I10 Essential (primary) hypertension: Secondary | ICD-10-CM

## 2021-04-24 DIAGNOSIS — H43813 Vitreous degeneration, bilateral: Secondary | ICD-10-CM

## 2021-04-24 DIAGNOSIS — H34832 Tributary (branch) retinal vein occlusion, left eye, with macular edema: Secondary | ICD-10-CM | POA: Diagnosis not present

## 2021-05-15 ENCOUNTER — Encounter: Payer: Self-pay | Admitting: Internal Medicine

## 2021-05-16 ENCOUNTER — Other Ambulatory Visit: Payer: Self-pay

## 2021-05-16 ENCOUNTER — Encounter: Payer: Self-pay | Admitting: Emergency Medicine

## 2021-05-16 ENCOUNTER — Ambulatory Visit (INDEPENDENT_AMBULATORY_CARE_PROVIDER_SITE_OTHER): Payer: Medicare Other | Admitting: Emergency Medicine

## 2021-05-16 VITALS — BP 148/78 | HR 71 | Ht 70.0 in | Wt 168.0 lb

## 2021-05-16 DIAGNOSIS — M549 Dorsalgia, unspecified: Secondary | ICD-10-CM | POA: Diagnosis not present

## 2021-05-16 DIAGNOSIS — M545 Low back pain, unspecified: Secondary | ICD-10-CM

## 2021-05-16 LAB — POCT URINALYSIS DIP (MANUAL ENTRY)
Bilirubin, UA: NEGATIVE
Glucose, UA: NEGATIVE mg/dL
Ketones, POC UA: NEGATIVE mg/dL
Leukocytes, UA: NEGATIVE
Nitrite, UA: NEGATIVE
Protein Ur, POC: NEGATIVE mg/dL
Spec Grav, UA: >=1.03 — AB (ref 1.010–1.025)
Urobilinogen, UA: 0.2 E.U./dL
pH, UA: 6 (ref 5.0–8.0)

## 2021-05-16 NOTE — Progress Notes (Signed)
[facility] 70 y.o.   Chief Complaint  Patient presents with   Back Pain    3 DAYS    HISTORY OF PRESENT ILLNESS: This is a 70 y.o. male complaining of study lumbar pain for the past 3 days without any associated symptoms. Sharp localized pain to lumbar area worse with movement and better with rest.  Denies bladder or bowel symptoms. No radiation.  No recent injuries.  Works out regularly. No other complaints or medical concerns today.  Back Pain Pertinent negatives include no abdominal pain, chest pain, dysuria, fever, headaches or weakness.    Prior to Admission medications   Medication Sig Start Date End Date Taking? Authorizing Provider  amLODipine (NORVASC) 10 MG tablet Take 1 tablet (10 mg total) by mouth daily. 04/16/21  Yes Hoyt Koch, MD  irbesartan (AVAPRO) 150 MG tablet TAKE 1 TABLET(150 MG) BY MOUTH DAILY 02/18/21  Yes Janith Lima, MD    No Known Allergies  Patient Active Problem List   Diagnosis Date Noted   Numbness and tingling in left arm 04/16/2021   Benign prostatic hyperplasia without lower urinary tract symptoms 09/27/2020   Elevated BUN 06/22/2020   Hypertensive left ventricular hypertrophy, without heart failure 06/22/2020   Essential hypertension 06/21/2020   Hyperlipidemia LDL goal <70 06/21/2020   Hemorrhoid 08/02/2019   Liver fibrosis 12/03/2016   OA (osteoarthritis) of hip 06/14/2014   Anal condyloma 05/08/2011   Chronic hepatitis C ([place]) 06/16/2007    Past Medical History:  Diagnosis Date   Anal condyloma    Arthritis    both hips   Diverticulosis    Dr Collene Mares   Heart murmur    Hepatitis C    Genotype 23 in 2000; seen @ [facility], treatment declined   History of kidney stones    Nephrolithiasis 2002   X 1   Thrombocytopenia ([place]) 2010   Platelet count 99,000    Past Surgical History:  Procedure Laterality Date   COLONOSCOPY  2004   Tics   excision anal condyloma  02/05/12   Dr Zella Richer   FLEXIBLE SIGMOIDOSCOPY   2012   Dr Collene Mares for evaluation of venereal warts   TOTAL HIP ARTHROPLASTY Left 10/15/2016   Procedure: LEFT TOTAL HIP ARTHROPLASTY ANTERIOR APPROACH;  Surgeon: Gaynelle Arabian, MD;  Location: [facility];  Service: Orthopedics;  Laterality: Left;   venereal wart resection  2012   Dr Zella Richer   WISDOM TOOTH EXTRACTION      Social History   Socioeconomic History   Marital status: Single    Spouse name: Not on file   Number of children: Not on file   Years of education: Not on file   Highest education level: Not on file  Occupational History   Occupation: [employer]  Tobacco Use   Smoking status: Never   Smokeless tobacco: Never  Substance and Sexual Activity   Alcohol use: No    Alcohol/week: 0.0 standard drinks   Drug use: No    Comment: History-smoked pot    Sexual activity: Not on file  Other Topics Concern   Not on file  Social History Narrative   Regular exercise- yes, tennis,yoga,running, swimming, weights         Social Determinants of Health   Financial Resource Strain: Low Risk    Difficulty of Paying Living Expenses: Not hard at all  Food Insecurity: No Food Insecurity   Worried About [place] in the Last Year: Never true  Ran Out of Food in the Last Year: Never true  Transportation Needs: No Transportation Needs   Lack of Transportation (Medical): No   Lack of Transportation (Non-Medical): No  Physical Activity: Sufficiently Active   Days of Exercise per Week: 5 days   Minutes of Exercise per Session: 30 min  Stress: No Stress Concern Present   Feeling of Stress : Not at all  Social Connections: Moderately Integrated   Frequency of Communication with Friends and Family: More than three times a week   Frequency of Social Gatherings with Friends and Family: More than three times a week   Attends Religious Services: More than 4 times per year   Active Member of [employer] or Organizations: No   Attends Music therapist: More than 4 times per  year   Marital Status: Never married  Human resources officer Violence: Not on file    Family History  Problem Relation Age of Onset   Meniere's disease Father    Alcohol abuse Father    Coronary artery disease Father        S/P CABG , > 31   Lung cancer Maternal Uncle        smoker   Diabetes Neg Hx    Stroke Neg Hx    Colon cancer Neg Hx      Review of Systems  Constitutional: Negative.  Negative for chills and fever.  HENT: Negative.  Negative for congestion and sore throat.   Respiratory: Negative.  Negative for cough and shortness of breath.   Cardiovascular: Negative.  Negative for chest pain and palpitations.  Gastrointestinal: Negative.  Negative for abdominal pain, diarrhea, nausea and vomiting.  Genitourinary: Negative.  Negative for dysuria, frequency, hematuria and urgency.  Musculoskeletal:  Positive for back pain.  Skin: Negative.  Negative for rash.  Neurological: Negative.  Negative for dizziness, sensory change, focal weakness, weakness and headaches.  All other systems reviewed and are negative.   Physical Exam Vitals reviewed.  Constitutional:      Appearance: Normal appearance.  HENT:     Head: Normocephalic.  Eyes:     Extraocular Movements: Extraocular movements intact.     Pupils: Pupils are equal, round, and reactive to light.  Cardiovascular:     Rate and Rhythm: Normal rate.  Pulmonary:     Effort: Pulmonary effort is normal.  Abdominal:     General: There is no distension.     Palpations: Abdomen is soft.     Tenderness: There is no abdominal tenderness.  Musculoskeletal:     Cervical back: Normal range of motion.     Lumbar back: No spasms, tenderness or bony tenderness. Normal range of motion. Negative right straight leg raise test and negative left straight leg raise test.  Neurological:     General: No focal deficit present.     Mental Status: He is alert and oriented to person, place, and time.     Sensory: No sensory deficit.      Motor: No weakness.     Coordination: Coordination normal.     Gait: Gait normal.     Deep Tendon Reflexes: Reflexes normal.  Psychiatric:        Mood and Affect: Mood normal.        Behavior: Behavior normal.   Results for orders placed or performed in visit on 05/16/21 (from the past 24 hour(s))  POCT urinalysis dipstick     Status: Abnormal   Collection Time: 05/16/21  4:30 PM  Result  Value Ref Range   Color, UA yellow yellow   Clarity, UA clear clear   Glucose, UA negative negative mg/dL   Bilirubin, UA negative negative   Ketones, POC UA negative negative mg/dL   Spec Grav, UA >=1.030 (A) 1.010 - 1.025   Blood, UA trace-intact (A) negative   pH, UA 6.0 5.0 - 8.0   Protein Ur, POC negative negative mg/dL   Urobilinogen, UA 0.2 0.2 or 1.0 E.U./dL   Nitrite, UA Negative Negative   Leukocytes, UA Negative Negative     ASSESSMENT & PLAN: Xzadrian was seen today for back pain.  Diagnoses and all orders for this visit:  Lumbar pain -     POCT urinalysis dipstick  Musculoskeletal back pain   Seems to be musculoskeletal in nature.  No red flag signs or symptoms.  Advised to take Tylenol and or Advil as needed. Advised to contact the office if no better or worse during the next several days.  Patient Instructions  Acute Back Pain, Adult Acute back pain is sudden and usually short-lived. It is often caused by an injury to the muscles and tissues in the back. The injury may result from: A muscle or ligament getting overstretched or torn (strained). Ligaments are tissues that connect bones to each other. Lifting something improperly can cause a back strain. Wear and tear (degeneration) of the spinal disks. Spinal disks are circular tissue that provide cushioning between the bones of the spine (vertebrae). Twisting motions, such as while playing sports or doing yard work. A hit to the back. Arthritis. You may have a physical exam, lab tests, and imaging tests to find the cause  ofyour pain. Acute back pain usually goes away with rest and home care. Follow these instructions at home: Managing pain, stiffness, and swelling Treatment may include medicines for pain and inflammation that are taken by mouth or applied to the skin, prescription pain medicine, or muscle relaxants. Take over-the-counter and prescription medicines only as told by your health care provider. Your health care provider may recommend applying ice during the first 24-48 hours after your pain starts. To do this: Put ice in a plastic bag. Place a towel between your skin and the bag. Leave the ice on for 20 minutes, 2-3 times a day. If directed, apply heat to the affected area as often as told by your health care provider. Use the heat source that your health care provider recommends, such as a moist heat pack or a heating pad. Place a towel between your skin and the heat source. Leave the heat on for 20-30 minutes. Remove the heat if your skin turns bright red. This is especially important if you are unable to feel pain, heat, or cold. You have a greater risk of getting burned. Activity  Do not stay in bed. Staying in bed for more than 1-2 days can delay your recovery. Sit up and stand up straight. Avoid leaning forward when you sit or hunching over when you stand. If you work at a desk, sit close to it so you do not need to lean over. Keep your chin tucked in. Keep your neck drawn back, and keep your elbows bent at a 90-degree angle (right angle). Sit high and close to the steering wheel when you drive. Add lower back (lumbar) support to your car seat, if needed. Take short walks on even surfaces as soon as you are able. Try to increase the length of time you walk each day. Do not sit,  drive, or stand in one place for more than 30 minutes at a time. Sitting or standing for long periods of time can put stress on your back. Do not drive or use heavy machinery while taking prescription pain medicine. Use  proper lifting techniques. When you bend and lift, use positions that put less stress on your back: [place] your knees. Keep the load close to your body. Avoid twisting. Exercise regularly as told by your health care provider. Exercising helps your back heal faster and helps prevent back injuries by keeping muscles strong and flexible. Work with a physical therapist to make a safe exercise program, as recommended by your health care provider. Do any exercises as told by your physical therapist.  Lifestyle Maintain a healthy weight. Extra weight puts stress on your back and makes it difficult to have good posture. Avoid activities or situations that make you feel anxious or stressed. Stress and anxiety increase muscle tension and can make back pain worse. Learn ways to manage anxiety and stress, such as through exercise. General instructions Sleep on a firm mattress in a comfortable position. Try lying on your side with your knees slightly bent. If you lie on your back, put a pillow under your knees. Follow your treatment plan as told by your health care provider. This may include: Cognitive or behavioral therapy. Acupuncture or massage therapy. Meditation or yoga. Contact a health care provider if: You have pain that is not relieved with rest or medicine. You have increasing pain going down into your legs or buttocks. Your pain does not improve after 2 weeks. You have pain at night. You lose weight without trying. You have a fever or chills. Get help right away if: You develop new bowel or bladder control problems. You have unusual weakness or numbness in your arms or legs. You develop nausea or vomiting. You develop abdominal pain. You feel faint. Summary Acute back pain is sudden and usually short-lived. Use proper lifting techniques. When you bend and lift, use positions that put less stress on your back. Take over-the-counter and prescription medicines and apply heat or ice as  directed by your health care provider. This information is not intended to replace advice given to you by your health care provider. Make sure you discuss any questions you have with your healthcare provider. Document Revised: 06/12/2020 Document Reviewed: 06/15/2020 Elsevier Patient Education  2022 [place], MD [facility]

## 2021-05-16 NOTE — Patient Instructions (Signed)
Acute Back Pain, Adult Acute back pain is sudden and usually short-lived. It is often caused by an injury to the muscles and tissues in the back. The injury may result from: A muscle or ligament getting overstretched or torn (strained). Ligaments are tissues that connect bones to each other. Lifting something improperly can cause a back strain. Wear and tear (degeneration) of the spinal disks. Spinal disks are circular tissue that provide cushioning between the bones of the spine (vertebrae). Twisting motions, such as while playing sports or doing yard work. A hit to the back. Arthritis. You may have a physical exam, lab tests, and imaging tests to find the cause ofyour pain. Acute back pain usually goes away with rest and home care. Follow these instructions at home: Managing pain, stiffness, and swelling Treatment may include medicines for pain and inflammation that are taken by mouth or applied to the skin, prescription pain medicine, or muscle relaxants. Take over-the-counter and prescription medicines only as told by your health care provider. Your health care provider may recommend applying ice during the first 24-48 hours after your pain starts. To do this: Put ice in a plastic bag. Place a towel between your skin and the bag. Leave the ice on for 20 minutes, 2-3 times a day. If directed, apply heat to the affected area as often as told by your health care provider. Use the heat source that your health care provider recommends, such as a moist heat pack or a heating pad. Place a towel between your skin and the heat source. Leave the heat on for 20-30 minutes. Remove the heat if your skin turns bright red. This is especially important if you are unable to feel pain, heat, or cold. You have a greater risk of getting burned. Activity  Do not stay in bed. Staying in bed for more than 1-2 days can delay your recovery. Sit up and stand up straight. Avoid leaning forward when you sit or  hunching over when you stand. If you work at a desk, sit close to it so you do not need to lean over. Keep your chin tucked in. Keep your neck drawn back, and keep your elbows bent at a 90-degree angle (right angle). Sit high and close to the steering wheel when you drive. Add lower back (lumbar) support to your car seat, if needed. Take short walks on even surfaces as soon as you are able. Try to increase the length of time you walk each day. Do not sit, drive, or stand in one place for more than 30 minutes at a time. Sitting or standing for long periods of time can put stress on your back. Do not drive or use heavy machinery while taking prescription pain medicine. Use proper lifting techniques. When you bend and lift, use positions that put less stress on your back: Bend your knees. Keep the load close to your body. Avoid twisting. Exercise regularly as told by your health care provider. Exercising helps your back heal faster and helps prevent back injuries by keeping muscles strong and flexible. Work with a physical therapist to make a safe exercise program, as recommended by your health care provider. Do any exercises as told by your physical therapist.  Lifestyle Maintain a healthy weight. Extra weight puts stress on your back and makes it difficult to have good posture. Avoid activities or situations that make you feel anxious or stressed. Stress and anxiety increase muscle tension and can make back pain worse. Learn ways to manage   anxiety and stress, such as through exercise. General instructions Sleep on a firm mattress in a comfortable position. Try lying on your side with your knees slightly bent. If you lie on your back, put a pillow under your knees. Follow your treatment plan as told by your health care provider. This may include: Cognitive or behavioral therapy. Acupuncture or massage therapy. Meditation or yoga. Contact a health care provider if: You have pain that is not  relieved with rest or medicine. You have increasing pain going down into your legs or buttocks. Your pain does not improve after 2 weeks. You have pain at night. You lose weight without trying. You have a fever or chills. Get help right away if: You develop new bowel or bladder control problems. You have unusual weakness or numbness in your arms or legs. You develop nausea or vomiting. You develop abdominal pain. You feel faint. Summary Acute back pain is sudden and usually short-lived. Use proper lifting techniques. When you bend and lift, use positions that put less stress on your back. Take over-the-counter and prescription medicines and apply heat or ice as directed by your health care provider. This information is not intended to replace advice given to you by your health care provider. Make sure you discuss any questions you have with your healthcare provider. Document Revised: 06/12/2020 Document Reviewed: 06/15/2020 Elsevier Patient Education  2022 [employer]  

## 2021-05-20 ENCOUNTER — Ambulatory Visit: Payer: BLUE CROSS/BLUE SHIELD | Admitting: Internal Medicine

## 2021-05-22 ENCOUNTER — Encounter (INDEPENDENT_AMBULATORY_CARE_PROVIDER_SITE_OTHER): Payer: Medicare Other | Admitting: Ophthalmology

## 2021-05-27 ENCOUNTER — Encounter (INDEPENDENT_AMBULATORY_CARE_PROVIDER_SITE_OTHER): Payer: Medicare Other | Admitting: Ophthalmology

## 2021-05-27 ENCOUNTER — Other Ambulatory Visit: Payer: Self-pay

## 2021-05-27 DIAGNOSIS — H43813 Vitreous degeneration, bilateral: Secondary | ICD-10-CM

## 2021-05-27 DIAGNOSIS — H2513 Age-related nuclear cataract, bilateral: Secondary | ICD-10-CM

## 2021-05-27 DIAGNOSIS — H34832 Tributary (branch) retinal vein occlusion, left eye, with macular edema: Secondary | ICD-10-CM | POA: Diagnosis not present

## 2021-05-27 DIAGNOSIS — I10 Essential (primary) hypertension: Secondary | ICD-10-CM

## 2021-05-27 DIAGNOSIS — H35033 Hypertensive retinopathy, bilateral: Secondary | ICD-10-CM

## 2021-05-29 ENCOUNTER — Encounter (INDEPENDENT_AMBULATORY_CARE_PROVIDER_SITE_OTHER): Payer: Medicare Other | Admitting: Ophthalmology

## 2021-06-24 ENCOUNTER — Encounter (INDEPENDENT_AMBULATORY_CARE_PROVIDER_SITE_OTHER): Payer: Medicare Other | Admitting: Ophthalmology

## 2021-06-24 ENCOUNTER — Other Ambulatory Visit: Payer: Self-pay

## 2021-06-24 DIAGNOSIS — H35372 Puckering of macula, left eye: Secondary | ICD-10-CM | POA: Diagnosis not present

## 2021-06-24 DIAGNOSIS — H34832 Tributary (branch) retinal vein occlusion, left eye, with macular edema: Secondary | ICD-10-CM

## 2021-06-24 DIAGNOSIS — H35033 Hypertensive retinopathy, bilateral: Secondary | ICD-10-CM | POA: Diagnosis not present

## 2021-06-24 DIAGNOSIS — H43813 Vitreous degeneration, bilateral: Secondary | ICD-10-CM | POA: Diagnosis not present

## 2021-06-24 DIAGNOSIS — I10 Essential (primary) hypertension: Secondary | ICD-10-CM | POA: Diagnosis not present

## 2021-07-12 ENCOUNTER — Encounter: Payer: Self-pay | Admitting: Internal Medicine

## 2021-07-12 ENCOUNTER — Other Ambulatory Visit: Payer: Self-pay

## 2021-07-12 DIAGNOSIS — I10 Essential (primary) hypertension: Secondary | ICD-10-CM

## 2021-07-12 DIAGNOSIS — I119 Hypertensive heart disease without heart failure: Secondary | ICD-10-CM

## 2021-07-12 MED ORDER — IRBESARTAN 150 MG PO TABS: ORAL_TABLET | ORAL | 1 refills | Status: DC

## 2021-07-24 ENCOUNTER — Encounter (INDEPENDENT_AMBULATORY_CARE_PROVIDER_SITE_OTHER): Payer: Medicare Other | Admitting: Ophthalmology

## 2021-07-24 ENCOUNTER — Other Ambulatory Visit: Payer: Self-pay

## 2021-07-24 DIAGNOSIS — H43813 Vitreous degeneration, bilateral: Secondary | ICD-10-CM | POA: Diagnosis not present

## 2021-07-24 DIAGNOSIS — H35033 Hypertensive retinopathy, bilateral: Secondary | ICD-10-CM

## 2021-07-24 DIAGNOSIS — H34832 Tributary (branch) retinal vein occlusion, left eye, with macular edema: Secondary | ICD-10-CM

## 2021-07-24 DIAGNOSIS — I10 Essential (primary) hypertension: Secondary | ICD-10-CM | POA: Diagnosis not present

## 2021-07-24 DIAGNOSIS — H34212 Partial retinal artery occlusion, left eye: Secondary | ICD-10-CM

## 2021-08-01 ENCOUNTER — Other Ambulatory Visit: Payer: Self-pay

## 2021-08-01 ENCOUNTER — Ambulatory Visit (INDEPENDENT_AMBULATORY_CARE_PROVIDER_SITE_OTHER): Payer: Medicare Other

## 2021-08-01 DIAGNOSIS — Z23 Encounter for immunization: Secondary | ICD-10-CM

## 2021-08-14 DIAGNOSIS — D2272 Melanocytic nevi of left lower limb, including hip: Secondary | ICD-10-CM | POA: Diagnosis not present

## 2021-08-14 DIAGNOSIS — L814 Other melanin hyperpigmentation: Secondary | ICD-10-CM | POA: Diagnosis not present

## 2021-08-14 DIAGNOSIS — L578 Other skin changes due to chronic exposure to nonionizing radiation: Secondary | ICD-10-CM | POA: Diagnosis not present

## 2021-08-14 DIAGNOSIS — Z23 Encounter for immunization: Secondary | ICD-10-CM | POA: Diagnosis not present

## 2021-08-14 DIAGNOSIS — D2271 Melanocytic nevi of right lower limb, including hip: Secondary | ICD-10-CM | POA: Diagnosis not present

## 2021-08-14 DIAGNOSIS — L821 Other seborrheic keratosis: Secondary | ICD-10-CM | POA: Diagnosis not present

## 2021-08-14 DIAGNOSIS — D225 Melanocytic nevi of trunk: Secondary | ICD-10-CM | POA: Diagnosis not present

## 2021-08-21 ENCOUNTER — Other Ambulatory Visit: Payer: Self-pay

## 2021-08-21 ENCOUNTER — Encounter (INDEPENDENT_AMBULATORY_CARE_PROVIDER_SITE_OTHER): Payer: Medicare Other | Admitting: Ophthalmology

## 2021-08-21 DIAGNOSIS — I10 Essential (primary) hypertension: Secondary | ICD-10-CM

## 2021-08-21 DIAGNOSIS — H35033 Hypertensive retinopathy, bilateral: Secondary | ICD-10-CM

## 2021-08-21 DIAGNOSIS — H34212 Partial retinal artery occlusion, left eye: Secondary | ICD-10-CM | POA: Diagnosis not present

## 2021-08-21 DIAGNOSIS — H34832 Tributary (branch) retinal vein occlusion, left eye, with macular edema: Secondary | ICD-10-CM | POA: Diagnosis not present

## 2021-08-21 DIAGNOSIS — H2513 Age-related nuclear cataract, bilateral: Secondary | ICD-10-CM | POA: Diagnosis not present

## 2021-08-21 DIAGNOSIS — H43813 Vitreous degeneration, bilateral: Secondary | ICD-10-CM

## 2021-09-02 ENCOUNTER — Encounter: Payer: Self-pay | Admitting: Internal Medicine

## 2021-09-02 DIAGNOSIS — H9190 Unspecified hearing loss, unspecified ear: Secondary | ICD-10-CM

## 2021-09-09 ENCOUNTER — Other Ambulatory Visit: Payer: Self-pay

## 2021-09-09 ENCOUNTER — Ambulatory Visit: Payer: Medicare Other | Attending: Audiologist | Admitting: Audiologist

## 2021-09-09 DIAGNOSIS — H903 Sensorineural hearing loss, bilateral: Secondary | ICD-10-CM | POA: Diagnosis not present

## 2021-09-09 NOTE — Procedures (Signed)
  Outpatient Audiology and [place], [place]  [address] [phone]  AUDIOLOGICAL  EVALUATION  NAME: Timothy Bentley     DOB:   07/29/1951      MRN: [id number]                                                                                     DATE: 09/09/2021     REFERENT: Hoyt Koch, MD STATUS: Outpatient DIAGNOSIS: Sensorineural Hearing Loss Bilateral     History: Tiny was seen for an audiological evaluation.  [name] is receiving a hearing evaluation due to concerns for difficulty hearing the TV . [name] says people often sound muffled or unclear. This difficulty began gradually. No pain or pressure reported in either ear. Tinnitus present for many years in both ears sounding like a high pitched ring. He often swims in a pool. His father has Meniere's disease. [name] has a history of noise exposure from playing rock and roll.  Medical history negative for any condition which is a risk factor for hearing loss. No other relevant case history reported.   Evaluation:  Otoscopy showed a clear view of the tympanic membranes, bilaterally Right ear canal showed white protrusion consistent with possible exostoses or infected pore. If he experiences pain in this ear follow up with Otolaryngology.  Tympanometry results were consistent with normal middle ear pressure, bilaterally   Audiometric testing was completed using conventional audiometry with high frequency supraural transducer. Speech Recognition Thresholds were consistent with pure tone averages. Word Recognition was excellent at conversation and an elevated level. Pure tone thresholds show normal sloping to mild sensorineural hearing loss in both ears. Test results are consistent with age and noise related hearing loss.   Results:  The test results were reviewed with [name]. [name] has mild sensorineural high frequency hearing loss in both ears.  He had good speech understanding at conversational  levels. Recommend getting musician earplugs for hearing protection. These can be fit at most audiologist's offices that dispenses hearing aids.  Right ear canal showed white protrusion consistent with possible exostoses or irritated pore. If he experiences pain in this ear follow up with Otolaryngology.   Recommendations: 1.   Monitor hearing annually.  2.   Use hearing protection. Consider purchasing musician earplugs that are custom fit with filters.   [name]  Audiologist, Au.D., CCC-A 09/09/2021  3:31 PM  Cc: Hoyt Koch, MD

## 2021-09-26 ENCOUNTER — Other Ambulatory Visit: Payer: Self-pay

## 2021-09-26 ENCOUNTER — Encounter (INDEPENDENT_AMBULATORY_CARE_PROVIDER_SITE_OTHER): Payer: Medicare Other | Admitting: Ophthalmology

## 2021-09-26 DIAGNOSIS — I10 Essential (primary) hypertension: Secondary | ICD-10-CM

## 2021-09-26 DIAGNOSIS — H34212 Partial retinal artery occlusion, left eye: Secondary | ICD-10-CM

## 2021-09-26 DIAGNOSIS — H34832 Tributary (branch) retinal vein occlusion, left eye, with macular edema: Secondary | ICD-10-CM | POA: Diagnosis not present

## 2021-09-26 DIAGNOSIS — H35033 Hypertensive retinopathy, bilateral: Secondary | ICD-10-CM

## 2021-09-26 DIAGNOSIS — H43813 Vitreous degeneration, bilateral: Secondary | ICD-10-CM | POA: Diagnosis not present

## 2021-10-18 DIAGNOSIS — Z96643 Presence of artificial hip joint, bilateral: Secondary | ICD-10-CM | POA: Diagnosis not present

## 2021-10-25 ENCOUNTER — Encounter: Payer: Self-pay | Admitting: Internal Medicine

## 2021-10-28 ENCOUNTER — Other Ambulatory Visit: Payer: Self-pay

## 2021-10-28 DIAGNOSIS — I10 Essential (primary) hypertension: Secondary | ICD-10-CM

## 2021-10-28 DIAGNOSIS — I119 Hypertensive heart disease without heart failure: Secondary | ICD-10-CM

## 2021-10-28 MED ORDER — AMLODIPINE BESYLATE 10 MG PO TABS: 10.0000 mg | ORAL_TABLET | Freq: Every day | ORAL | 0 refills | Status: DC

## 2021-10-28 MED ORDER — IRBESARTAN 150 MG PO TABS: ORAL_TABLET | ORAL | 0 refills | Status: DC

## 2021-10-30 ENCOUNTER — Other Ambulatory Visit: Payer: Self-pay

## 2021-10-30 ENCOUNTER — Encounter (INDEPENDENT_AMBULATORY_CARE_PROVIDER_SITE_OTHER): Payer: Medicare Other | Admitting: Ophthalmology

## 2021-10-30 DIAGNOSIS — I10 Essential (primary) hypertension: Secondary | ICD-10-CM

## 2021-10-30 DIAGNOSIS — H34832 Tributary (branch) retinal vein occlusion, left eye, with macular edema: Secondary | ICD-10-CM

## 2021-10-30 DIAGNOSIS — H35033 Hypertensive retinopathy, bilateral: Secondary | ICD-10-CM

## 2021-10-30 DIAGNOSIS — H34212 Partial retinal artery occlusion, left eye: Secondary | ICD-10-CM | POA: Diagnosis not present

## 2021-10-30 DIAGNOSIS — H43813 Vitreous degeneration, bilateral: Secondary | ICD-10-CM

## 2021-10-31 ENCOUNTER — Encounter (INDEPENDENT_AMBULATORY_CARE_PROVIDER_SITE_OTHER): Payer: Medicare Other | Admitting: Ophthalmology

## 2021-11-29 ENCOUNTER — Ambulatory Visit: Payer: BLUE CROSS/BLUE SHIELD

## 2021-12-04 ENCOUNTER — Encounter (INDEPENDENT_AMBULATORY_CARE_PROVIDER_SITE_OTHER): Payer: Medicare Other | Admitting: Ophthalmology

## 2021-12-04 ENCOUNTER — Other Ambulatory Visit: Payer: Self-pay

## 2021-12-04 ENCOUNTER — Ambulatory Visit: Payer: BLUE CROSS/BLUE SHIELD

## 2021-12-04 DIAGNOSIS — H34832 Tributary (branch) retinal vein occlusion, left eye, with macular edema: Secondary | ICD-10-CM | POA: Diagnosis not present

## 2021-12-04 DIAGNOSIS — H43813 Vitreous degeneration, bilateral: Secondary | ICD-10-CM

## 2021-12-04 DIAGNOSIS — H2513 Age-related nuclear cataract, bilateral: Secondary | ICD-10-CM

## 2021-12-04 DIAGNOSIS — H35033 Hypertensive retinopathy, bilateral: Secondary | ICD-10-CM | POA: Diagnosis not present

## 2021-12-04 DIAGNOSIS — I10 Essential (primary) hypertension: Secondary | ICD-10-CM | POA: Diagnosis not present

## 2021-12-04 DIAGNOSIS — H34212 Partial retinal artery occlusion, left eye: Secondary | ICD-10-CM

## 2021-12-09 ENCOUNTER — Ambulatory Visit (INDEPENDENT_AMBULATORY_CARE_PROVIDER_SITE_OTHER): Payer: Medicare Other

## 2021-12-09 ENCOUNTER — Other Ambulatory Visit: Payer: Self-pay

## 2021-12-09 VITALS — BP 120/62 | HR 75 | Temp 97.8°F | Resp 16 | Ht 70.0 in | Wt 170.0 lb

## 2021-12-09 DIAGNOSIS — Z0000 Encounter for general adult medical examination without abnormal findings: Secondary | ICD-10-CM | POA: Diagnosis not present

## 2021-12-09 NOTE — Patient Instructions (Signed)
[name] , Thank you for taking time to come for your Medicare Wellness Visit. I appreciate your ongoing commitment to your health goals. Please review the following plan we discussed and let me know if I can assist you in the future.   Screening recommendations/referrals: Colonoscopy: 09/10/2015; due every 10 years (due 09/09/2025) Recommended yearly ophthalmology/optometry visit for glaucoma screening and checkup Recommended yearly dental visit for hygiene and checkup  Vaccinations: Influenza vaccine: 08/01/2021; due every Fall Season Pneumococcal vaccine: 07/28/2018, 08/01/2019 (Completed) Tdap vaccine: 11/22/2013; due every 10 years (due 11/23/2023) Shingles vaccine: never done ; check with local pharmacy for vaccine & cost  Covid-19: 12/05/2019, 01/03/2020, 08/24/2020, 02/26/2021  Advanced directives: Please bring a copy of your health care power of attorney and living will to the office at your convenience.  Conditions/risks identified: Reviewed health maintenance screenings with patient today and relevant education, vaccines, and/or referrals were provided.   Continue to eat heart healthy diet (full of fruits, vegetables, whole grains, lean protein, water--limit salt, fat, and sugar intake) and increase physical activity as tolerated.  Continue doing brain stimulating activities (puzzles, reading, adult coloring books, staying active) to keep memory sharp.   Next appointment: Please schedule your next Medicare Wellness Visit with your Nurse Health Advisor in 1 year by calling [phone].  Preventive Care 69 Years and Older, Male Preventive care refers to lifestyle choices and visits with your health care provider that can promote health and wellness. What does preventive care include? A yearly physical exam. This is also called an annual well check. Dental exams once or twice a year. Routine eye exams. Ask your health care provider how often you should have your eyes  checked. Personal lifestyle choices, including: Daily care of your teeth and gums. Regular physical activity. Eating a healthy diet. Avoiding tobacco and drug use. Limiting alcohol use. Practicing safe sex. Taking low doses of aspirin every day. Taking vitamin and mineral supplements as recommended by your health care provider. What happens during an annual well check? The services and screenings done by your health care provider during your annual well check will depend on your age, overall health, lifestyle risk factors, and family history of disease. Counseling  Your health care provider may ask you questions about your: Alcohol use. Tobacco use. Drug use. Emotional well-being. Home and relationship well-being. Sexual activity. Eating habits. History of falls. Memory and ability to understand (cognition). Work and work Statistician. Screening  You may have the following tests or measurements: Height, weight, and BMI. Blood pressure. Lipid and cholesterol levels. These may be checked every 5 years, or more frequently if you are over 69 years old. Skin check. Lung cancer screening. You may have this screening every year starting at age 69 if you have a 30-pack-year history of smoking and currently smoke or have quit within the past 15 years. Fecal occult blood test (FOBT) of the stool. You may have this test every year starting at age 69. Flexible sigmoidoscopy or colonoscopy. You may have a sigmoidoscopy every 5 years or a colonoscopy every 10 years starting at age 69. Prostate cancer screening. Recommendations will vary depending on your family history and other risks. Hepatitis C blood test. Hepatitis B blood test. Sexually transmitted disease (STD) testing. Diabetes screening. This is done by checking your blood sugar (glucose) after you have not eaten for a while (fasting). You may have this done every 1-3 years. Abdominal aortic aneurysm (AAA) screening. You may need this  if you are a current or  former smoker. Osteoporosis. You may be screened starting at age 69 if you are at high risk. Talk with your health care provider about your test results, treatment options, and if necessary, the need for more tests. Vaccines  Your health care provider may recommend certain vaccines, such as: Influenza vaccine. This is recommended every year. Tetanus, diphtheria, and acellular pertussis (Tdap, Td) vaccine. You may need a Td booster every 10 years. Zoster vaccine. You may need this after age 69. Pneumococcal 13-valent conjugate (PCV13) vaccine. One dose is recommended after age 69. Pneumococcal polysaccharide (PPSV23) vaccine. One dose is recommended after age 69. Talk to your health care provider about which screenings and vaccines you need and how often you need them. This information is not intended to replace advice given to you by your health care provider. Make sure you discuss any questions you have with your health care provider. Document Released: 10/19/2015 Document Revised: 06/11/2016 Document Reviewed: 07/24/2015 Elsevier Interactive Patient Education  2017 [employer] Prevention in the Home Falls can cause injuries. They can happen to people of all ages. There are many things you can do to make your home safe and to help prevent falls. What can I do on the outside of my home? Regularly fix the edges of walkways and driveways and fix any cracks. Remove anything that might make you trip as you walk through a door, such as a raised step or threshold. Trim any bushes or trees on the path to your home. Use bright outdoor lighting. Clear any walking paths of anything that might make someone trip, such as rocks or tools. Regularly check to see if handrails are loose or broken. Make sure that both sides of any steps have handrails. Any raised decks and porches should have guardrails on the edges. Have any leaves, snow, or ice cleared regularly. Use sand  or salt on walking paths during winter. Clean up any spills in your garage right away. This includes oil or grease spills. What can I do in the bathroom? Use night lights. Install grab bars by the toilet and in the tub and shower. Do not use towel bars as grab bars. Use non-skid mats or decals in the tub or shower. If you need to sit down in the shower, use a plastic, non-slip stool. Keep the floor dry. Clean up any water that spills on the floor as soon as it happens. Remove soap buildup in the tub or shower regularly. Attach bath mats securely with double-sided non-slip rug tape. Do not have throw rugs and other things on the floor that can make you trip. What can I do in the bedroom? Use night lights. Make sure that you have a light by your bed that is easy to reach. Do not use any sheets or blankets that are too big for your bed. They should not hang down onto the floor. Have a firm chair that has side arms. You can use this for support while you get dressed. Do not have throw rugs and other things on the floor that can make you trip. What can I do in the kitchen? Clean up any spills right away. Avoid walking on wet floors. Keep items that you use a lot in easy-to-reach places. If you need to reach something above you, use a strong step stool that has a grab bar. Keep electrical cords out of the way. Do not use floor polish or wax that makes floors slippery. If you must use wax, use non-skid  floor wax. Do not have throw rugs and other things on the floor that can make you trip. What can I do with my stairs? Do not leave any items on the stairs. Make sure that there are handrails on both sides of the stairs and use them. Fix handrails that are broken or loose. Make sure that handrails are as long as the stairways. Check any carpeting to make sure that it is firmly attached to the stairs. Fix any carpet that is loose or worn. Avoid having throw rugs at the top or bottom of the stairs.  If you do have throw rugs, attach them to the floor with carpet tape. Make sure that you have a light switch at the top of the stairs and the bottom of the stairs. If you do not have them, ask someone to add them for you. What else can I do to help prevent falls? Wear shoes that: Do not have high heels. Have rubber bottoms. Are comfortable and fit you well. Are closed at the toe. Do not wear sandals. If you use a stepladder: Make sure that it is fully opened. Do not climb a closed stepladder. Make sure that both sides of the stepladder are locked into place. Ask someone to hold it for you, if possible. Clearly Adalbert and make sure that you can see: Any grab bars or handrails. First and last steps. Where the edge of each step is. Use tools that help you move around (mobility aids) if they are needed. These include: Canes. Walkers. Scooters. Crutches. Turn on the lights when you go into a dark area. Replace any light bulbs as soon as they burn out. Set up your furniture so you have a clear path. Avoid moving your furniture around. If any of your floors are uneven, fix them. If there are any pets around you, be aware of where they are. Review your medicines with your doctor. Some medicines can make you feel dizzy. This can increase your chance of falling. Ask your doctor what other things that you can do to help prevent falls. This information is not intended to replace advice given to you by your health care provider. Make sure you discuss any questions you have with your health care provider. Document Released: 07/19/2009 Document Revised: 02/28/2016 Document Reviewed: 10/27/2014 Elsevier Interactive Patient Education  2017 [employer].

## 2021-12-09 NOTE — Progress Notes (Signed)
Subjective:   Timothy Bentley is a 71 y.o. male who presents for Medicare Annual/Subsequent preventive examination.  Review of Systems     Cardiac Risk Factors include: advanced age (>49mn, >>30women);hypertension;male gender     Objective:    Today's Vitals   12/09/21 1420  BP: 120/62  Pulse: 75  Resp: 16  Temp: 97.8 F (36.6 C)  SpO2: 95%  Weight: 170 lb (77.1 kg)  Height: '5\' 10"'$  (1.778 m)  PainSc: 0-No pain   Body mass index is 24.39 kg/m.  Advanced Directives 11/28/2020 06/27/2017 10/15/2016 10/10/2016 09/22/2016 07/02/2015  Does Patient Have a Medical Advance Directive? No No No No No No  Would patient like information on creating a medical advance directive? Yes (MAU/Ambulatory/Procedural Areas - Information given) - Yes (MAU/Ambulatory/Procedural Areas - Information given) Yes (MAU/Ambulatory/Procedural Areas - Information given) Yes (ED - Information included in AVS) No - patient declined information    Current Medications (verified) Outpatient Encounter Medications as of 12/09/2021  Medication Sig   amLODipine (NORVASC) 10 MG tablet Take 1 tablet (10 mg total) by mouth daily.   irbesartan (AVAPRO) 150 MG tablet TAKE 1 TABLET(150 MG) BY MOUTH DAILY   No facility-administered encounter medications on file as of 12/09/2021.    Allergies (verified) Patient has no known allergies.   History: Past Medical History:  Diagnosis Date   Anal condyloma    Arthritis    both hips   Diverticulosis    Dr Timothy Bentley  Heart murmur    Hepatitis C    Genotype 23 in 2000; [facility]   History of kidney stones    Nephrolithiasis 2002   X 1   Thrombocytopenia [place]   Platelet count 99,000   Past Surgical History:  Procedure Laterality Date   COLONOSCOPY  2004   Tics   excision anal condyloma  02/05/12   Dr Timothy Bentley  FLEXIBLE SIGMOIDOSCOPY  2012   Dr Timothy Maresfor evaluation of venereal warts   TOTAL HIP ARTHROPLASTY Left 10/15/2016   Procedure:  LEFT TOTAL HIP ARTHROPLASTY ANTERIOR APPROACH;  Surgeon: FGaynelle Arabian MD;  Location: [facility];  Service: Orthopedics;  Laterality: Left;   venereal wart resection  2012   Dr Timothy Bentley  WISDOM TOOTH EXTRACTION     Family History  Problem Relation Age of Onset   Meniere's disease Father    Alcohol abuse Father    Coronary artery disease Father        S/P CABG , > 558  Lung cancer Maternal Uncle        smoker   Diabetes Neg Hx    Stroke Neg Hx    Colon cancer Neg Hx    Social History   Socioeconomic History   Marital status: Single    Spouse name: Not on file   Number of children: Not on file   Years of education: Not on file   Highest education level: Not on file  Occupational History   Occupation: [employer]  Tobacco Use   Smoking status: Never   Smokeless tobacco: Never  Substance and Sexual Activity   Alcohol use: No    Alcohol/week: 0.0 standard drinks   Drug use: No    Comment: History-smoked pot    Sexual activity: Not on file  Other Topics Concern   Not on file  Social History Narrative   Regular exercise- yes, tennis,yoga,running, swimming, weights         Social Determinants  of Health   Financial Resource Strain: Low Risk    Difficulty of Paying Living Expenses: Not hard at all  Food Insecurity: No Food Insecurity   Worried About [place] in the Last Year: Never true   [place] in the Last Year: Never true  Transportation Needs: No Transportation Needs   Lack of Transportation (Medical): No   Lack of Transportation (Non-Medical): No  Physical Activity: Sufficiently Active   Days of Exercise per Week: 7 days   Minutes of Exercise per Session: 30 min  Stress: No Stress Concern Present   Feeling of Stress : Not at all  Social Connections: Moderately Integrated   Frequency of Communication with Friends and Family: More than three times a week   Frequency of Social Gatherings with Friends and Family: More than three times a week    Attends Religious Services: More than 4 times per year   Active Member of [employer] or Organizations: No   Attends Music therapist: More than 4 times per year   Marital Status: Never married    Tobacco Counseling Counseling given: Not Answered   Clinical Intake:  Pre-visit preparation completed: Yes  Pain : No/denies pain Pain Score: 0-No pain     BMI - recorded: 24.39 Nutritional Status: BMI of 19-24  Normal Nutritional Risks: None Diabetes: No  How often do you need to have someone help you when you read instructions, pamphlets, or other written materials from your doctor or pharmacy?: 1 - Never What is the last grade level you completed in school?: [employer]  Diabetic? no  Interpreter Needed?: No  Information entered by :: Timothy Abu, LPN   Activities of Daily Living In your present state of health, do you have any difficulty performing the following activities: 12/09/2021  Hearing? N  Vision? N  Difficulty concentrating or making decisions? N  Walking or climbing stairs? N  Dressing or bathing? N  Doing errands, shopping? N  Preparing Food and eating ? N  Using the Toilet? N  In the past six months, have you accidently leaked urine? N  Do you have problems with loss of bowel control? N  Managing your Medications? N  Managing your Finances? N  Housekeeping or managing your Housekeeping? N  Some recent data might be hidden    Patient Care Team: Timothy Koch, MD as PCP - General (Internal Medicine) Timothy Bentley [facility] as Consulting Physician (Optometry) Timothy Pedro, MD as Consulting Physician (Ophthalmology)  Indicate any recent Medical Services you may have received from other than [facility] providers in the past year (date may be approximate).     Assessment:   This is a routine wellness examination for [name].  Hearing/Vision screen Hearing Screening - Comments:: Patient denied any hearing difficulty.   No hearing  aids.  Vision Screening - Comments:: Patient does wear corrective lenses/contacts.   Eye exam done by: [facility] and Timothy Hoist, MD (Retina Specialist)  Dietary issues and exercise activities discussed: Current Exercise Habits: Home exercise routine, Type of exercise: walking;Other - see comments;treadmill;stretching;strength training/weights ([employer]), Time (Minutes): 60, Frequency (Times/Week): 7, Weekly Exercise (Minutes/Week): 420, Intensity: Moderate, Exercise limited by: None identified   Goals Addressed             This Visit's Progress    Patient Stated       My goal is to keep getting healthier and stay away from sugars.  I started the gluten-free  diet back in January 2023 and its is working out great.      Depression Screen PHQ 2/9 Scores 12/09/2021 11/28/2020 08/01/2019 07/28/2018 12/02/2016 09/17/2016 05/11/2013  PHQ - 2 Score 0 0 0 0 0 0 0    Fall Risk Fall Risk  12/09/2021 11/28/2020 08/01/2019 07/28/2018 12/02/2016  Falls in the past year? 0 0 0 No No  Number falls in past yr: 0 0 - - -  Injury with Fall? 0 0 - - -  Risk for fall due to : No Fall Risks No Fall Risks - - -  Follow up Falls evaluation completed - - - -    FALL RISK PREVENTION PERTAINING TO THE HOME:  Any stairs in or around the home? Yes  If so, are there any without handrails? No  Home free of loose throw rugs in walkways, pet beds, electrical cords, etc? Yes  Adequate lighting in your home to reduce risk of falls? Yes   ASSISTIVE DEVICES UTILIZED TO PREVENT FALLS:  Life alert? No  Use of a cane, walker or w/c? No  Grab bars in the bathroom? No  Shower chair or bench in shower? No  Elevated toilet seat or a handicapped toilet? Yes   TIMED UP AND GO:  Was the test performed? Yes .  Length of time to ambulate 10 feet: 5 sec.   Gait steady and fast without use of assistive device  Cognitive Function: Normal cognitive status assessed by direct observation by this Nurse Health  Advisor. No abnormalities found.          Immunizations Immunization History  Administered Date(s) Administered   Fluad Quad(high Dose 65+) 08/01/2019, 06/21/2020, 08/01/2021   Hepatitis B, adult 01/07/2017, 02/11/2017, 07/08/2017   Influenza,inj,Quad PF,6+ Mos 07/08/2017   Influenza-Unspecified 07/07/2015, 07/22/2017, 07/27/2018   Moderna SARS-COV2 Booster Vaccination 02/26/2021   Moderna Sars-Covid-2 Vaccination 12/05/2019, 01/03/2020, 08/24/2020   Pneumococcal Conjugate-13 07/28/2018   Pneumococcal Polysaccharide-23 08/01/2019   Td 10/06/2004   Tdap 11/22/2013    TDAP status: Up to date  Flu Vaccine status: Up to date  Pneumococcal vaccine status: Up to date  Covid-19 vaccine status: Completed vaccines  Qualifies for Shingles Vaccine? Yes   Zostavax completed No   Shingrix Completed?: No.    Education has been provided regarding the importance of this vaccine. Patient has been advised to call insurance company to determine out of pocket expense if they have not yet received this vaccine. Advised may also receive vaccine at local pharmacy or Health Dept. Verbalized acceptance and understanding.  Screening Tests Health Maintenance  Topic Date Due   Zoster Vaccines- Shingrix (1 of 2) Never done   COVID-19 Vaccine (4 - Booster for Moderna series) 04/23/2021   TETANUS/TDAP  11/23/2023   COLONOSCOPY (Pts 45-35yr Insurance coverage will need to be confirmed)  09/09/2025   Pneumonia Vaccine 69 Years old  Completed   INFLUENZA VACCINE  Completed   Hepatitis C Screening  Completed   HPV VACCINES  Aged Out    Health Maintenance  Health Maintenance Due  Topic Date Due   Zoster Vaccines- Shingrix (1 of 2) Never done   COVID-19 Vaccine (4 - Booster for Moderna series) 04/23/2021    Colorectal cancer screening: Type of screening: Colonoscopy. Completed 09/10/2015. Repeat every 10 years  Lung Cancer Screening: (Low Dose CT Chest recommended if Age 69-80years, 30 pack-year  currently smoking OR have quit w/in 15years.) does not qualify.   Lung Cancer Screening Referral: no  Additional Screening:  Hepatitis C Screening: does qualify; Completed yes  Vision Screening: Recommended annual ophthalmology exams for early detection of glaucoma and other disorders of the eye. Is the patient up to date with their annual eye exam?  Yes  Who is the provider or what is the name of the office in which the patient attends annual eye exams? [facility] & Dr. Tempie Bentley (Retina Specialist) If pt is not established with a provider, would they like to be referred to a provider to establish care? No .   Dental Screening: Recommended annual dental exams for proper oral hygiene  Community Resource Referral / Chronic Care Management: CRR required this visit?  No   CCM required this visit?  No      Plan:     I have personally reviewed and noted the following in the patients chart:   Medical and social history Use of alcohol, tobacco or illicit drugs  Current medications and supplements including opioid prescriptions. Patient is not currently taking opioid prescriptions. Functional ability and status Nutritional status Physical activity Advanced directives List of other physicians Hospitalizations, surgeries, and ER visits in previous 12 months Vitals Screenings to include cognitive, depression, and falls Referrals and appointments  In addition, I have reviewed and discussed with patient certain preventive protocols, quality metrics, and best practice recommendations. A written personalized care plan for preventive services as well as general preventive health recommendations were provided to patient.     Sheral Flow, LPN   10/13/4079   Nurse Notes:  Hearing Screening - Comments:: Patient denied any hearing difficulty.   No hearing aids.  Vision Screening - Comments:: Patient does wear corrective lenses/contacts.   Eye exam done by: [facility] and Timothy Hoist, MD (Retina Specialist)

## 2021-12-30 DIAGNOSIS — Z23 Encounter for immunization: Secondary | ICD-10-CM | POA: Diagnosis not present

## 2021-12-30 DIAGNOSIS — L2084 Intrinsic (allergic) eczema: Secondary | ICD-10-CM | POA: Diagnosis not present

## 2021-12-30 DIAGNOSIS — L570 Actinic keratosis: Secondary | ICD-10-CM | POA: Diagnosis not present

## 2022-01-08 ENCOUNTER — Encounter (INDEPENDENT_AMBULATORY_CARE_PROVIDER_SITE_OTHER): Payer: Medicare Other | Admitting: Ophthalmology

## 2022-01-08 DIAGNOSIS — H43813 Vitreous degeneration, bilateral: Secondary | ICD-10-CM | POA: Diagnosis not present

## 2022-01-08 DIAGNOSIS — H34212 Partial retinal artery occlusion, left eye: Secondary | ICD-10-CM | POA: Diagnosis not present

## 2022-01-08 DIAGNOSIS — I10 Essential (primary) hypertension: Secondary | ICD-10-CM

## 2022-01-08 DIAGNOSIS — H35033 Hypertensive retinopathy, bilateral: Secondary | ICD-10-CM

## 2022-01-08 DIAGNOSIS — H34832 Tributary (branch) retinal vein occlusion, left eye, with macular edema: Secondary | ICD-10-CM

## 2022-01-08 DIAGNOSIS — H2513 Age-related nuclear cataract, bilateral: Secondary | ICD-10-CM

## 2022-01-24 ENCOUNTER — Encounter: Payer: Self-pay | Admitting: Internal Medicine

## 2022-01-24 DIAGNOSIS — I10 Essential (primary) hypertension: Secondary | ICD-10-CM

## 2022-01-24 DIAGNOSIS — I119 Hypertensive heart disease without heart failure: Secondary | ICD-10-CM

## 2022-01-24 MED ORDER — KETOCONAZOLE 2 % EX CREA: 1.0000 "application " | TOPICAL_CREAM | Freq: Every day | CUTANEOUS | 0 refills | Status: DC

## 2022-01-24 MED ORDER — IRBESARTAN 150 MG PO TABS: ORAL_TABLET | ORAL | 3 refills | Status: DC

## 2022-02-05 ENCOUNTER — Encounter (INDEPENDENT_AMBULATORY_CARE_PROVIDER_SITE_OTHER): Payer: Medicare Other | Admitting: Ophthalmology

## 2022-02-05 DIAGNOSIS — H34212 Partial retinal artery occlusion, left eye: Secondary | ICD-10-CM

## 2022-02-05 DIAGNOSIS — I10 Essential (primary) hypertension: Secondary | ICD-10-CM

## 2022-02-05 DIAGNOSIS — H43813 Vitreous degeneration, bilateral: Secondary | ICD-10-CM

## 2022-02-05 DIAGNOSIS — H34832 Tributary (branch) retinal vein occlusion, left eye, with macular edema: Secondary | ICD-10-CM | POA: Diagnosis not present

## 2022-02-05 DIAGNOSIS — H35033 Hypertensive retinopathy, bilateral: Secondary | ICD-10-CM | POA: Diagnosis not present

## 2022-02-08 ENCOUNTER — Other Ambulatory Visit: Payer: Self-pay | Admitting: Internal Medicine

## 2022-02-08 DIAGNOSIS — I10 Essential (primary) hypertension: Secondary | ICD-10-CM

## 2022-02-08 DIAGNOSIS — I119 Hypertensive heart disease without heart failure: Secondary | ICD-10-CM

## 2022-02-21 ENCOUNTER — Other Ambulatory Visit: Payer: Self-pay | Admitting: Internal Medicine

## 2022-02-21 DIAGNOSIS — I10 Essential (primary) hypertension: Secondary | ICD-10-CM

## 2022-02-21 DIAGNOSIS — I119 Hypertensive heart disease without heart failure: Secondary | ICD-10-CM

## 2022-02-28 DIAGNOSIS — M79671 Pain in right foot: Secondary | ICD-10-CM | POA: Diagnosis not present

## 2022-02-28 DIAGNOSIS — L600 Ingrowing nail: Secondary | ICD-10-CM | POA: Diagnosis not present

## 2022-03-06 ENCOUNTER — Other Ambulatory Visit: Payer: Self-pay | Admitting: Internal Medicine

## 2022-03-06 DIAGNOSIS — I10 Essential (primary) hypertension: Secondary | ICD-10-CM

## 2022-03-06 DIAGNOSIS — I119 Hypertensive heart disease without heart failure: Secondary | ICD-10-CM

## 2022-03-07 MED ORDER — AMLODIPINE BESYLATE 10 MG PO TABS: 10.0000 mg | ORAL_TABLET | Freq: Every day | ORAL | 0 refills | Status: DC

## 2022-03-19 ENCOUNTER — Encounter (INDEPENDENT_AMBULATORY_CARE_PROVIDER_SITE_OTHER): Payer: Medicare Other | Admitting: Ophthalmology

## 2022-03-19 DIAGNOSIS — H34832 Tributary (branch) retinal vein occlusion, left eye, with macular edema: Secondary | ICD-10-CM | POA: Diagnosis not present

## 2022-03-19 DIAGNOSIS — I10 Essential (primary) hypertension: Secondary | ICD-10-CM | POA: Diagnosis not present

## 2022-03-19 DIAGNOSIS — H35033 Hypertensive retinopathy, bilateral: Secondary | ICD-10-CM | POA: Diagnosis not present

## 2022-03-19 DIAGNOSIS — H43813 Vitreous degeneration, bilateral: Secondary | ICD-10-CM

## 2022-03-19 DIAGNOSIS — H34212 Partial retinal artery occlusion, left eye: Secondary | ICD-10-CM

## 2022-03-22 ENCOUNTER — Other Ambulatory Visit: Payer: Self-pay | Admitting: Internal Medicine

## 2022-03-22 DIAGNOSIS — I10 Essential (primary) hypertension: Secondary | ICD-10-CM

## 2022-03-22 DIAGNOSIS — I119 Hypertensive heart disease without heart failure: Secondary | ICD-10-CM

## 2022-04-18 ENCOUNTER — Other Ambulatory Visit: Payer: Self-pay | Admitting: Internal Medicine

## 2022-04-18 DIAGNOSIS — I10 Essential (primary) hypertension: Secondary | ICD-10-CM

## 2022-04-18 DIAGNOSIS — I119 Hypertensive heart disease without heart failure: Secondary | ICD-10-CM

## 2022-04-24 ENCOUNTER — Other Ambulatory Visit: Payer: Self-pay | Admitting: Internal Medicine

## 2022-04-24 DIAGNOSIS — I10 Essential (primary) hypertension: Secondary | ICD-10-CM

## 2022-04-24 DIAGNOSIS — I119 Hypertensive heart disease without heart failure: Secondary | ICD-10-CM

## 2022-04-27 ENCOUNTER — Other Ambulatory Visit: Payer: Self-pay | Admitting: Internal Medicine

## 2022-04-27 DIAGNOSIS — I10 Essential (primary) hypertension: Secondary | ICD-10-CM

## 2022-04-27 DIAGNOSIS — I119 Hypertensive heart disease without heart failure: Secondary | ICD-10-CM

## 2022-04-28 ENCOUNTER — Encounter (INDEPENDENT_AMBULATORY_CARE_PROVIDER_SITE_OTHER): Payer: Medicare Other | Admitting: Ophthalmology

## 2022-04-28 DIAGNOSIS — H34212 Partial retinal artery occlusion, left eye: Secondary | ICD-10-CM

## 2022-04-28 DIAGNOSIS — H43813 Vitreous degeneration, bilateral: Secondary | ICD-10-CM | POA: Diagnosis not present

## 2022-04-28 DIAGNOSIS — I10 Essential (primary) hypertension: Secondary | ICD-10-CM

## 2022-04-28 DIAGNOSIS — H35033 Hypertensive retinopathy, bilateral: Secondary | ICD-10-CM

## 2022-04-28 DIAGNOSIS — H34832 Tributary (branch) retinal vein occlusion, left eye, with macular edema: Secondary | ICD-10-CM | POA: Diagnosis not present

## 2022-04-28 MED ORDER — AMLODIPINE BESYLATE 10 MG PO TABS: 10.0000 mg | ORAL_TABLET | Freq: Every day | ORAL | 0 refills | Status: DC

## 2022-04-30 ENCOUNTER — Encounter (INDEPENDENT_AMBULATORY_CARE_PROVIDER_SITE_OTHER): Payer: Medicare Other | Admitting: Ophthalmology

## 2022-05-07 ENCOUNTER — Other Ambulatory Visit: Payer: Self-pay | Admitting: Internal Medicine

## 2022-05-07 DIAGNOSIS — I119 Hypertensive heart disease without heart failure: Secondary | ICD-10-CM

## 2022-05-07 DIAGNOSIS — I10 Essential (primary) hypertension: Secondary | ICD-10-CM

## 2022-05-28 ENCOUNTER — Encounter (INDEPENDENT_AMBULATORY_CARE_PROVIDER_SITE_OTHER): Payer: Medicare Other | Admitting: Ophthalmology

## 2022-05-28 DIAGNOSIS — H43813 Vitreous degeneration, bilateral: Secondary | ICD-10-CM | POA: Diagnosis not present

## 2022-05-28 DIAGNOSIS — H34212 Partial retinal artery occlusion, left eye: Secondary | ICD-10-CM | POA: Diagnosis not present

## 2022-05-28 DIAGNOSIS — H35033 Hypertensive retinopathy, bilateral: Secondary | ICD-10-CM | POA: Diagnosis not present

## 2022-05-28 DIAGNOSIS — H34832 Tributary (branch) retinal vein occlusion, left eye, with macular edema: Secondary | ICD-10-CM

## 2022-05-28 DIAGNOSIS — I10 Essential (primary) hypertension: Secondary | ICD-10-CM | POA: Diagnosis not present

## 2022-05-30 ENCOUNTER — Other Ambulatory Visit: Payer: Self-pay | Admitting: Internal Medicine

## 2022-05-30 DIAGNOSIS — I10 Essential (primary) hypertension: Secondary | ICD-10-CM

## 2022-05-30 DIAGNOSIS — I119 Hypertensive heart disease without heart failure: Secondary | ICD-10-CM

## 2022-06-01 ENCOUNTER — Other Ambulatory Visit: Payer: Self-pay | Admitting: Internal Medicine

## 2022-06-01 DIAGNOSIS — I119 Hypertensive heart disease without heart failure: Secondary | ICD-10-CM

## 2022-06-01 DIAGNOSIS — I10 Essential (primary) hypertension: Secondary | ICD-10-CM

## 2022-06-02 ENCOUNTER — Telehealth: Payer: Self-pay | Admitting: Internal Medicine

## 2022-06-02 DIAGNOSIS — I119 Hypertensive heart disease without heart failure: Secondary | ICD-10-CM

## 2022-06-02 DIAGNOSIS — I10 Essential (primary) hypertension: Secondary | ICD-10-CM

## 2022-06-02 NOTE — Telephone Encounter (Signed)
Fine for 30 day fill. Keep scheduled visit.

## 2022-06-02 NOTE — Telephone Encounter (Signed)
Pt is calling requesting a refill on: amLODipine (NORVASC) 10 MG tablet  [id number] [place] 05/16/21 ROV 06/18/22  Pt is leaving going out of the country for 3 weeks tomorrow. I offered the available appt tomorrow afternoon he declined as that will cause a conflict on his travels.   I advised the pt that bc he hasn't been seen in over a year  there is a chance that the request will be denied.   Please advise

## 2022-06-02 NOTE — Telephone Encounter (Signed)
NOTE NOT NEEDED ?

## 2022-06-03 ENCOUNTER — Other Ambulatory Visit: Payer: Self-pay

## 2022-06-04 ENCOUNTER — Encounter (INDEPENDENT_AMBULATORY_CARE_PROVIDER_SITE_OTHER): Payer: Medicare Other | Admitting: Ophthalmology

## 2022-06-10 NOTE — Telephone Encounter (Signed)
The original prescription was reordered on 06/03/2022 by Debera Lat, [place]...Johny Chess

## 2022-06-18 ENCOUNTER — Ambulatory Visit (INDEPENDENT_AMBULATORY_CARE_PROVIDER_SITE_OTHER): Payer: Medicare Other | Admitting: Internal Medicine

## 2022-06-18 ENCOUNTER — Encounter: Payer: Self-pay | Admitting: Internal Medicine

## 2022-06-18 VITALS — BP 132/74 | HR 78 | Ht 68.0 in | Wt 165.0 lb

## 2022-06-18 DIAGNOSIS — I119 Hypertensive heart disease without heart failure: Secondary | ICD-10-CM

## 2022-06-18 DIAGNOSIS — Z8619 Personal history of other infectious and parasitic diseases: Secondary | ICD-10-CM

## 2022-06-18 DIAGNOSIS — E785 Hyperlipidemia, unspecified: Secondary | ICD-10-CM | POA: Diagnosis not present

## 2022-06-18 DIAGNOSIS — N400 Enlarged prostate without lower urinary tract symptoms: Secondary | ICD-10-CM | POA: Diagnosis not present

## 2022-06-18 DIAGNOSIS — I10 Essential (primary) hypertension: Secondary | ICD-10-CM | POA: Diagnosis not present

## 2022-06-18 DIAGNOSIS — K7400 Hepatic fibrosis, unspecified: Secondary | ICD-10-CM | POA: Diagnosis not present

## 2022-06-18 DIAGNOSIS — Z23 Encounter for immunization: Secondary | ICD-10-CM

## 2022-06-18 LAB — CBC
HCT: 44.7 % (ref 39.0–52.0)
Hemoglobin: 15.4 g/dL (ref 13.0–17.0)
MCHC: 34.4 g/dL (ref 30.0–36.0)
MCV: 89.7 fl (ref 78.0–100.0)
Platelets: 140 10*3/uL — ABNORMAL LOW (ref 150.0–400.0)
RBC: 4.98 Mil/uL (ref 4.22–5.81)
RDW: 13.2 % (ref 11.5–15.5)
WBC: 5.6 10*3/uL (ref 4.0–10.5)

## 2022-06-18 LAB — COMPREHENSIVE METABOLIC PANEL
ALT: 20 U/L (ref 0–53)
AST: 20 U/L (ref 0–37)
Albumin: 4.4 g/dL (ref 3.5–5.2)
Alkaline Phosphatase: 66 U/L (ref 39–117)
BUN: 28 mg/dL — ABNORMAL HIGH (ref 6–23)
CO2: 25 mEq/L (ref 19–32)
Calcium: 9.4 mg/dL (ref 8.4–10.5)
Chloride: 105 mEq/L (ref 96–112)
Creatinine, Ser: 0.92 mg/dL (ref 0.40–1.50)
GFR: 85.1 mL/min (ref >60.00)
Glucose, Bld: 85 mg/dL (ref 70–99)
Potassium: 3.8 mEq/L (ref 3.5–5.1)
Sodium: 140 mEq/L (ref 135–145)
Total Bilirubin: 1.1 mg/dL (ref 0.2–1.2)
Total Protein: 6.9 g/dL (ref 6.0–8.3)

## 2022-06-18 LAB — LIPID PANEL
Cholesterol: 132 mg/dL (ref 0–200)
HDL: 47.6 mg/dL (ref >39.00)
LDL Cholesterol: 72 mg/dL (ref 0–99)
NonHDL: 84.83
Total CHOL/HDL Ratio: 3
Triglycerides: 62 mg/dL (ref 0.0–149.0)
VLDL: 12.4 mg/dL (ref 0.0–40.0)

## 2022-06-18 LAB — PSA: PSA: 0.54 ng/mL (ref 0.10–4.00)

## 2022-06-18 MED ORDER — IRBESARTAN 150 MG PO TABS: 150.0000 mg | ORAL_TABLET | Freq: Every day | ORAL | 3 refills | Status: DC

## 2022-06-18 NOTE — Patient Instructions (Addendum)
We will go down to 5 mg daily amlodipine (okay to cut in half what you currently have).   You can get the covid and shingles at the pharmacy.

## 2022-06-18 NOTE — Progress Notes (Signed)
   Subjective:   Patient ID: Timothy Bentley, male    DOB: 17-Nov-1950, 71 y.o.   MRN: [id number]  HPI The patient is a 71 YO man coming in for follow up.   Review of Systems  Constitutional: Negative.   HENT: Negative.    Eyes: Negative.   Respiratory:  Negative for cough, chest tightness and shortness of breath.   Cardiovascular:  Negative for chest pain, palpitations and leg swelling.  Gastrointestinal:  Negative for abdominal distention, abdominal pain, constipation, diarrhea, nausea and vomiting.  Musculoskeletal: Negative.   Skin: Negative.   Neurological: Negative.   Psychiatric/Behavioral: Negative.      Objective:  Physical Exam Constitutional:      Appearance: He is well-developed.  HENT:     Head: Normocephalic and atraumatic.  Cardiovascular:     Rate and Rhythm: Normal rate and regular rhythm.  Pulmonary:     Effort: Pulmonary effort is normal. No respiratory distress.     Breath sounds: Normal breath sounds. No wheezing or rales.  Abdominal:     General: Bowel sounds are normal. There is no distension.     Palpations: Abdomen is soft.     Tenderness: There is no abdominal tenderness. There is no rebound.  Musculoskeletal:     Cervical back: Normal range of motion.  Skin:    General: Skin is warm and dry.  Neurological:     Mental Status: He is alert and oriented to person, place, and time.     Coordination: Coordination normal.     Vitals:   06/18/22 1048  BP: 132/74  Pulse: 78  SpO2: 95%  Weight: 165 lb (74.8 kg)  Height: '5\' 8"'$  (1.727 m)    Assessment & Plan:  Flu shot given at visit

## 2022-06-20 NOTE — Assessment & Plan Note (Signed)
Checking PSA and adjust as needed. No new clinical symptoms.

## 2022-06-20 NOTE — Assessment & Plan Note (Signed)
S/P treatment but with some fibrosis. Does not drink alcohol.

## 2022-06-20 NOTE — Assessment & Plan Note (Signed)
He is doing well and wishes to try to reduce medication. Will reduce amlodipine to 5 mg daily and if BP at goal 1-2 months could try off. Continue irbesartan at 150 mg daily.

## 2022-06-20 NOTE — Assessment & Plan Note (Signed)
Diet controlled checking lipid panel and adjust as needed.

## 2022-06-20 NOTE — Assessment & Plan Note (Signed)
Checking CMP and adjust as needed. He does not drink alcohol. Prior hep C which is cured.

## 2022-07-02 ENCOUNTER — Encounter (INDEPENDENT_AMBULATORY_CARE_PROVIDER_SITE_OTHER): Payer: Medicare Other | Admitting: Ophthalmology

## 2022-07-02 DIAGNOSIS — I10 Essential (primary) hypertension: Secondary | ICD-10-CM | POA: Diagnosis not present

## 2022-07-02 DIAGNOSIS — H34832 Tributary (branch) retinal vein occlusion, left eye, with macular edema: Secondary | ICD-10-CM | POA: Diagnosis not present

## 2022-07-02 DIAGNOSIS — H43813 Vitreous degeneration, bilateral: Secondary | ICD-10-CM | POA: Diagnosis not present

## 2022-07-02 DIAGNOSIS — H35033 Hypertensive retinopathy, bilateral: Secondary | ICD-10-CM

## 2022-07-06 ENCOUNTER — Other Ambulatory Visit: Payer: Self-pay | Admitting: Internal Medicine

## 2022-07-06 DIAGNOSIS — I119 Hypertensive heart disease without heart failure: Secondary | ICD-10-CM

## 2022-07-06 DIAGNOSIS — I10 Essential (primary) hypertension: Secondary | ICD-10-CM

## 2022-07-08 ENCOUNTER — Other Ambulatory Visit (HOSPITAL_COMMUNITY): Payer: Self-pay

## 2022-07-08 MED ORDER — AMLODIPINE BESYLATE 10 MG PO TABS
10.0000 mg | ORAL_TABLET | Freq: Every day | ORAL | 3 refills | Status: DC
  Filled 2022-07-08: qty 90, 90d supply, fill #0

## 2022-07-08 NOTE — Telephone Encounter (Signed)
LOV 05/2021. Please advise

## 2022-07-09 ENCOUNTER — Other Ambulatory Visit (HOSPITAL_COMMUNITY): Payer: Self-pay

## 2022-07-10 ENCOUNTER — Encounter: Payer: Self-pay | Admitting: Internal Medicine

## 2022-07-10 DIAGNOSIS — I10 Essential (primary) hypertension: Secondary | ICD-10-CM

## 2022-07-10 DIAGNOSIS — I119 Hypertensive heart disease without heart failure: Secondary | ICD-10-CM

## 2022-07-11 ENCOUNTER — Other Ambulatory Visit (HOSPITAL_COMMUNITY): Payer: Self-pay

## 2022-07-11 MED ORDER — AMLODIPINE BESYLATE 5 MG PO TABS: 5.0000 mg | ORAL_TABLET | Freq: Every day | ORAL | 3 refills | Status: DC

## 2022-07-11 NOTE — Telephone Encounter (Signed)
Pt have amlodipine 10 mg on medication list. Please advise

## 2022-07-13 ENCOUNTER — Other Ambulatory Visit: Payer: Self-pay | Admitting: Internal Medicine

## 2022-07-13 DIAGNOSIS — I10 Essential (primary) hypertension: Secondary | ICD-10-CM

## 2022-07-13 DIAGNOSIS — I119 Hypertensive heart disease without heart failure: Secondary | ICD-10-CM

## 2022-08-13 ENCOUNTER — Encounter (INDEPENDENT_AMBULATORY_CARE_PROVIDER_SITE_OTHER): Payer: Medicare Other | Admitting: Ophthalmology

## 2022-08-13 DIAGNOSIS — H34212 Partial retinal artery occlusion, left eye: Secondary | ICD-10-CM

## 2022-08-13 DIAGNOSIS — H34832 Tributary (branch) retinal vein occlusion, left eye, with macular edema: Secondary | ICD-10-CM

## 2022-08-13 DIAGNOSIS — H43813 Vitreous degeneration, bilateral: Secondary | ICD-10-CM | POA: Diagnosis not present

## 2022-08-13 DIAGNOSIS — H35033 Hypertensive retinopathy, bilateral: Secondary | ICD-10-CM

## 2022-08-13 DIAGNOSIS — I10 Essential (primary) hypertension: Secondary | ICD-10-CM | POA: Diagnosis not present

## 2022-08-15 DIAGNOSIS — M79645 Pain in left finger(s): Secondary | ICD-10-CM | POA: Diagnosis not present

## 2022-08-15 DIAGNOSIS — S60453A Superficial foreign body of left middle finger, initial encounter: Secondary | ICD-10-CM | POA: Diagnosis not present

## 2022-08-18 ENCOUNTER — Encounter: Payer: Self-pay | Admitting: Internal Medicine

## 2022-08-18 DIAGNOSIS — S60459A Superficial foreign body of unspecified finger, initial encounter: Secondary | ICD-10-CM | POA: Diagnosis not present

## 2022-08-18 DIAGNOSIS — S60559A Superficial foreign body of unspecified hand, initial encounter: Secondary | ICD-10-CM

## 2022-08-21 DIAGNOSIS — S60453A Superficial foreign body of left middle finger, initial encounter: Secondary | ICD-10-CM | POA: Diagnosis not present

## 2022-08-21 DIAGNOSIS — M795 Residual foreign body in soft tissue: Secondary | ICD-10-CM | POA: Diagnosis not present

## 2022-08-21 DIAGNOSIS — S61243A Puncture wound with foreign body of left middle finger without damage to nail, initial encounter: Secondary | ICD-10-CM | POA: Diagnosis not present

## 2022-08-21 DIAGNOSIS — S60453D Superficial foreign body of left middle finger, subsequent encounter: Secondary | ICD-10-CM | POA: Diagnosis not present

## 2022-08-21 DIAGNOSIS — Z1810 Retained metal fragments, unspecified: Secondary | ICD-10-CM | POA: Diagnosis not present

## 2022-08-25 DIAGNOSIS — D2272 Melanocytic nevi of left lower limb, including hip: Secondary | ICD-10-CM | POA: Diagnosis not present

## 2022-08-25 DIAGNOSIS — D2271 Melanocytic nevi of right lower limb, including hip: Secondary | ICD-10-CM | POA: Diagnosis not present

## 2022-08-25 DIAGNOSIS — L814 Other melanin hyperpigmentation: Secondary | ICD-10-CM | POA: Diagnosis not present

## 2022-08-25 DIAGNOSIS — D225 Melanocytic nevi of trunk: Secondary | ICD-10-CM | POA: Diagnosis not present

## 2022-08-25 DIAGNOSIS — L578 Other skin changes due to chronic exposure to nonionizing radiation: Secondary | ICD-10-CM | POA: Diagnosis not present

## 2022-08-25 DIAGNOSIS — L821 Other seborrheic keratosis: Secondary | ICD-10-CM | POA: Diagnosis not present

## 2022-09-17 ENCOUNTER — Encounter (INDEPENDENT_AMBULATORY_CARE_PROVIDER_SITE_OTHER): Payer: Medicare Other | Admitting: Ophthalmology

## 2022-09-17 DIAGNOSIS — H34212 Partial retinal artery occlusion, left eye: Secondary | ICD-10-CM | POA: Diagnosis not present

## 2022-09-17 DIAGNOSIS — H35033 Hypertensive retinopathy, bilateral: Secondary | ICD-10-CM | POA: Diagnosis not present

## 2022-09-17 DIAGNOSIS — H43813 Vitreous degeneration, bilateral: Secondary | ICD-10-CM | POA: Diagnosis not present

## 2022-09-17 DIAGNOSIS — I10 Essential (primary) hypertension: Secondary | ICD-10-CM | POA: Diagnosis not present

## 2022-09-17 DIAGNOSIS — H34832 Tributary (branch) retinal vein occlusion, left eye, with macular edema: Secondary | ICD-10-CM | POA: Diagnosis not present

## 2022-09-22 DIAGNOSIS — H348322 Tributary (branch) retinal vein occlusion, left eye, stable: Secondary | ICD-10-CM | POA: Diagnosis not present

## 2022-09-25 DIAGNOSIS — S61209A Unspecified open wound of unspecified finger without damage to nail, initial encounter: Secondary | ICD-10-CM | POA: Diagnosis not present

## 2022-10-20 ENCOUNTER — Encounter (INDEPENDENT_AMBULATORY_CARE_PROVIDER_SITE_OTHER): Payer: Medicare Other | Admitting: Ophthalmology

## 2022-10-20 DIAGNOSIS — H34832 Tributary (branch) retinal vein occlusion, left eye, with macular edema: Secondary | ICD-10-CM

## 2022-10-20 DIAGNOSIS — I10 Essential (primary) hypertension: Secondary | ICD-10-CM

## 2022-10-20 DIAGNOSIS — H34212 Partial retinal artery occlusion, left eye: Secondary | ICD-10-CM | POA: Diagnosis not present

## 2022-10-20 DIAGNOSIS — H35033 Hypertensive retinopathy, bilateral: Secondary | ICD-10-CM | POA: Diagnosis not present

## 2022-10-20 DIAGNOSIS — H43813 Vitreous degeneration, bilateral: Secondary | ICD-10-CM | POA: Diagnosis not present

## 2022-11-24 ENCOUNTER — Encounter (INDEPENDENT_AMBULATORY_CARE_PROVIDER_SITE_OTHER): Payer: Medicare Other | Admitting: Ophthalmology

## 2022-11-24 ENCOUNTER — Telehealth: Payer: Self-pay

## 2022-11-24 DIAGNOSIS — I10 Essential (primary) hypertension: Secondary | ICD-10-CM

## 2022-11-24 DIAGNOSIS — H43813 Vitreous degeneration, bilateral: Secondary | ICD-10-CM | POA: Diagnosis not present

## 2022-11-24 DIAGNOSIS — H35033 Hypertensive retinopathy, bilateral: Secondary | ICD-10-CM | POA: Diagnosis not present

## 2022-11-24 DIAGNOSIS — H34212 Partial retinal artery occlusion, left eye: Secondary | ICD-10-CM | POA: Diagnosis not present

## 2022-11-24 DIAGNOSIS — H34832 Tributary (branch) retinal vein occlusion, left eye, with macular edema: Secondary | ICD-10-CM | POA: Diagnosis not present

## 2022-11-24 NOTE — Telephone Encounter (Signed)
Called patient to schedule Medicare Annual Wellness Visit (AWV). No voicemail available to leave a message.  Last date of AWV: 12/10/22  Please schedule an appointment at any time with [facility].  If any questions, please contact me at [phone].  Thank you ,  P.J. Quentin Cornwall, CMA (AAMA)

## 2022-12-30 ENCOUNTER — Encounter (INDEPENDENT_AMBULATORY_CARE_PROVIDER_SITE_OTHER): Payer: Medicare Other | Admitting: Ophthalmology

## 2022-12-30 DIAGNOSIS — H2513 Age-related nuclear cataract, bilateral: Secondary | ICD-10-CM | POA: Diagnosis not present

## 2022-12-30 DIAGNOSIS — H34832 Tributary (branch) retinal vein occlusion, left eye, with macular edema: Secondary | ICD-10-CM

## 2022-12-30 DIAGNOSIS — I10 Essential (primary) hypertension: Secondary | ICD-10-CM

## 2022-12-30 DIAGNOSIS — H35033 Hypertensive retinopathy, bilateral: Secondary | ICD-10-CM | POA: Diagnosis not present

## 2022-12-30 DIAGNOSIS — H43813 Vitreous degeneration, bilateral: Secondary | ICD-10-CM | POA: Diagnosis not present

## 2022-12-30 DIAGNOSIS — H34212 Partial retinal artery occlusion, left eye: Secondary | ICD-10-CM | POA: Diagnosis not present

## 2023-02-04 ENCOUNTER — Encounter (INDEPENDENT_AMBULATORY_CARE_PROVIDER_SITE_OTHER): Payer: Medicare Other | Admitting: Ophthalmology

## 2023-02-04 DIAGNOSIS — H35033 Hypertensive retinopathy, bilateral: Secondary | ICD-10-CM | POA: Diagnosis not present

## 2023-02-04 DIAGNOSIS — H34832 Tributary (branch) retinal vein occlusion, left eye, with macular edema: Secondary | ICD-10-CM | POA: Diagnosis not present

## 2023-02-04 DIAGNOSIS — H43813 Vitreous degeneration, bilateral: Secondary | ICD-10-CM | POA: Diagnosis not present

## 2023-02-04 DIAGNOSIS — H34212 Partial retinal artery occlusion, left eye: Secondary | ICD-10-CM

## 2023-02-04 DIAGNOSIS — I10 Essential (primary) hypertension: Secondary | ICD-10-CM

## 2023-02-05 ENCOUNTER — Telehealth: Payer: Self-pay | Admitting: Radiology

## 2023-02-05 NOTE — Telephone Encounter (Signed)
Contacted [name] to schedule their annual wellness visit.  Could not LVM for pt to call back. Will try calling again at a later time.   Travaris Kosh K. CMA

## 2023-03-11 ENCOUNTER — Encounter (INDEPENDENT_AMBULATORY_CARE_PROVIDER_SITE_OTHER): Payer: Medicare Other | Admitting: Ophthalmology

## 2023-03-11 DIAGNOSIS — H35033 Hypertensive retinopathy, bilateral: Secondary | ICD-10-CM

## 2023-03-11 DIAGNOSIS — I10 Essential (primary) hypertension: Secondary | ICD-10-CM | POA: Diagnosis not present

## 2023-03-11 DIAGNOSIS — H34832 Tributary (branch) retinal vein occlusion, left eye, with macular edema: Secondary | ICD-10-CM | POA: Diagnosis not present

## 2023-03-11 DIAGNOSIS — H43813 Vitreous degeneration, bilateral: Secondary | ICD-10-CM

## 2023-04-15 ENCOUNTER — Encounter (INDEPENDENT_AMBULATORY_CARE_PROVIDER_SITE_OTHER): Payer: Medicare Other | Admitting: Ophthalmology

## 2023-04-15 DIAGNOSIS — H43813 Vitreous degeneration, bilateral: Secondary | ICD-10-CM

## 2023-04-15 DIAGNOSIS — H35033 Hypertensive retinopathy, bilateral: Secondary | ICD-10-CM

## 2023-04-15 DIAGNOSIS — H34832 Tributary (branch) retinal vein occlusion, left eye, with macular edema: Secondary | ICD-10-CM | POA: Diagnosis not present

## 2023-04-15 DIAGNOSIS — H34212 Partial retinal artery occlusion, left eye: Secondary | ICD-10-CM | POA: Diagnosis not present

## 2023-04-15 DIAGNOSIS — I10 Essential (primary) hypertension: Secondary | ICD-10-CM | POA: Diagnosis not present

## 2023-05-06 ENCOUNTER — Encounter (INDEPENDENT_AMBULATORY_CARE_PROVIDER_SITE_OTHER): Payer: Self-pay

## 2023-05-13 ENCOUNTER — Ambulatory Visit: Payer: BLUE CROSS/BLUE SHIELD | Admitting: Internal Medicine

## 2023-05-19 ENCOUNTER — Ambulatory Visit: Payer: BLUE CROSS/BLUE SHIELD | Admitting: Internal Medicine

## 2023-05-19 ENCOUNTER — Ambulatory Visit (INDEPENDENT_AMBULATORY_CARE_PROVIDER_SITE_OTHER): Payer: Medicare Other | Admitting: Internal Medicine

## 2023-05-19 ENCOUNTER — Encounter: Payer: Self-pay | Admitting: Internal Medicine

## 2023-05-19 VITALS — BP 130/68 | HR 73 | Temp 97.6°F | Ht 68.0 in | Wt 168.0 lb

## 2023-05-19 DIAGNOSIS — Z8619 Personal history of other infectious and parasitic diseases: Secondary | ICD-10-CM | POA: Diagnosis not present

## 2023-05-19 DIAGNOSIS — E785 Hyperlipidemia, unspecified: Secondary | ICD-10-CM

## 2023-05-19 DIAGNOSIS — K7400 Hepatic fibrosis, unspecified: Secondary | ICD-10-CM

## 2023-05-19 DIAGNOSIS — Z125 Encounter for screening for malignant neoplasm of prostate: Secondary | ICD-10-CM

## 2023-05-19 DIAGNOSIS — N401 Enlarged prostate with lower urinary tract symptoms: Secondary | ICD-10-CM

## 2023-05-19 DIAGNOSIS — Z23 Encounter for immunization: Secondary | ICD-10-CM

## 2023-05-19 DIAGNOSIS — Z0000 Encounter for general adult medical examination without abnormal findings: Secondary | ICD-10-CM

## 2023-05-19 DIAGNOSIS — N400 Enlarged prostate without lower urinary tract symptoms: Secondary | ICD-10-CM

## 2023-05-19 DIAGNOSIS — I10 Essential (primary) hypertension: Secondary | ICD-10-CM | POA: Diagnosis not present

## 2023-05-19 DIAGNOSIS — R3912 Poor urinary stream: Secondary | ICD-10-CM

## 2023-05-19 LAB — COMPREHENSIVE METABOLIC PANEL
ALT: 27 U/L (ref 0–53)
AST: 25 U/L (ref 0–37)
Albumin: 4.4 g/dL (ref 3.5–5.2)
Alkaline Phosphatase: 68 U/L (ref 39–117)
BUN: 22 mg/dL (ref 6–23)
CO2: 27 mEq/L (ref 19–32)
Calcium: 9.4 mg/dL (ref 8.4–10.5)
Chloride: 99 mEq/L (ref 96–112)
Creatinine, Ser: 0.83 mg/dL (ref 0.40–1.50)
GFR: 88.97 mL/min (ref >60.00)
Glucose, Bld: 114 mg/dL — ABNORMAL HIGH (ref 70–99)
Potassium: 4 mEq/L (ref 3.5–5.1)
Sodium: 135 mEq/L (ref 135–145)
Total Bilirubin: 0.9 mg/dL (ref 0.2–1.2)
Total Protein: 6.6 g/dL (ref 6.0–8.3)

## 2023-05-19 LAB — LIPID PANEL
Cholesterol: 168 mg/dL (ref 0–200)
HDL: 43.3 mg/dL (ref >39.00)
LDL Cholesterol: 99 mg/dL (ref 0–99)
NonHDL: 124.42
Total CHOL/HDL Ratio: 4
Triglycerides: 127 mg/dL (ref 0.0–149.0)
VLDL: 25.4 mg/dL (ref 0.0–40.0)

## 2023-05-19 LAB — CBC
HCT: 45.4 % (ref 39.0–52.0)
Hemoglobin: 15.3 g/dL (ref 13.0–17.0)
MCHC: 33.8 g/dL (ref 30.0–36.0)
MCV: 90.5 fl (ref 78.0–100.0)
Platelets: 176 10*3/uL (ref 150.0–400.0)
RBC: 5.02 Mil/uL (ref 4.22–5.81)
RDW: 12.8 % (ref 11.5–15.5)
WBC: 5.3 10*3/uL (ref 4.0–10.5)

## 2023-05-19 LAB — PSA: PSA: 0.71 ng/mL (ref 0.10–4.00)

## 2023-05-19 MED ORDER — KETOCONAZOLE 2 % EX CREA: 1.0000 | TOPICAL_CREAM | Freq: Every day | CUTANEOUS | 11 refills | Status: AC

## 2023-05-19 MED ORDER — PROMETHAZINE-DM 6.25-15 MG/5ML PO SYRP: 5.0000 mL | ORAL_SOLUTION | Freq: Four times a day (QID) | ORAL | 0 refills | Status: DC | PRN

## 2023-05-19 NOTE — Assessment & Plan Note (Signed)
Flu shot yearly. Pneumonia complete. Shingrix due at pharmacy. Tetanus due 2025. Colonoscopy due 2026. Counseled about sun safety and mole surveillance. Counseled about the dangers of distracted driving. Given 10 year screening recommendations.

## 2023-05-19 NOTE — Assessment & Plan Note (Signed)
Checking PSA and adjust as needed. Some weak stream medication not needed.

## 2023-05-19 NOTE — Assessment & Plan Note (Signed)
Checking CMP for stability.  

## 2023-05-19 NOTE — Progress Notes (Signed)
Subjective:   Patient ID: Timothy Bentley, male    DOB: Mar 11, 1951, 72 y.o.   MRN: [id number]  HPI Here for medicare wellness, no new complaints. Please see A/P for status and treatment of chronic medical problems.   Diet: heart healthy Physical activity: active exercises Depression/mood screen: negative Hearing: intact to whispered voice, mild loss bilaterally Visual acuity: grossly normal with lens, performs annual eye exam  ADLs: capable Fall risk: none Home safety: good Cognitive evaluation: intact to orientation, naming, recall and repetition EOL planning: adv directives discussed  Flowsheet Row Office Visit from [facility]/19/2023 in [facility] [place]  PHQ-2 Total Score 2       Flowsheet Row Office Visit from 05/19/2023 in [facility]  PHQ-9 Total Score 3         08/01/2019    1:08 PM 11/28/2020    3:28 PM 12/09/2021    2:26 PM 06/18/2022   10:50 AM 05/19/2023    3:11 PM  Fall Risk  Falls in the past year? 0 0 0 0 0  Was there an injury with Fall?  0 0  0  Fall Risk Category Calculator  0 0  0  Fall Risk Category (Retired)  Low Low    (RETIRED) Patient Fall Risk Level Low fall risk Low fall risk Low fall risk    Patient at Risk for Falls Due to  No Fall Risks No Fall Risks  No Fall Risks  Fall risk Follow up   Falls evaluation completed  Falls evaluation completed    I have personally reviewed and have noted 1. The patient's medical and social history - reviewed today no changes 2. Their use of alcohol, tobacco or illicit drugs 3. Their current medications and supplements 4. The patient's functional ability including ADL's, fall risks, home safety risks and hearing or visual impairment. 5. Diet and physical activities 6. Evidence for depression or mood disorders 7. Care team reviewed and updated 8.  The patient is not on an opioid pain medication.  Patient Care Team: Myrlene Broker, MD as PCP - General  (Internal Medicine) [facility] [place] as Consulting Physician (Optometry) Sherrie George, MD as Consulting Physician (Ophthalmology) Past Medical History:  Diagnosis Date   Anal condyloma    Arthritis    both hips   Diverticulosis    Dr Loreta Ave   Heart murmur    Hepatitis C    Genotype 23 in 2000; seen @ [facility], treatment declined   History of kidney stones    Nephrolithiasis 2002   X 1   Thrombocytopenia (HCC) 2010   Platelet count 99,000   Past Surgical History:  Procedure Laterality Date   COLONOSCOPY  10/06/2002   Tics   excision anal condyloma  02/05/2012   Dr Abbey Chatters   FLEXIBLE SIGMOIDOSCOPY  10/06/2010   Dr Loreta Ave for evaluation of venereal warts   JOINT REPLACEMENT  January/December 2018   Total Hip Replacement R and L   TOTAL HIP ARTHROPLASTY Left 10/15/2016   Procedure: LEFT TOTAL HIP ARTHROPLASTY ANTERIOR APPROACH;  Surgeon: Ollen Gross, MD;  Location: [facility];  Service: Orthopedics;  Laterality: Left;   venereal wart resection  10/06/2010   Dr Abbey Chatters   WISDOM TOOTH EXTRACTION     Family History  Problem Relation Age of Onset   Meniere's disease Father    Alcohol abuse Father    Coronary artery disease Father  S/P CABG , > 55   Lung cancer Maternal Uncle        smoker   Diabetes Neg Hx    Stroke Neg Hx    Colon cancer Neg Hx    Review of Systems  Constitutional: Negative.   HENT: Negative.    Eyes: Negative.   Respiratory:  Negative for cough, chest tightness and shortness of breath.   Cardiovascular:  Negative for chest pain, palpitations and leg swelling.  Gastrointestinal:  Negative for abdominal distention, abdominal pain, constipation, diarrhea, nausea and vomiting.  Musculoskeletal: Negative.   Skin: Negative.   Neurological: Negative.   Psychiatric/Behavioral: Negative.     Objective:  Physical Exam Constitutional:      Appearance: He is well-developed.  HENT:     Head: Normocephalic and atraumatic.   Cardiovascular:     Rate and Rhythm: Normal rate and regular rhythm.  Pulmonary:     Effort: Pulmonary effort is normal. No respiratory distress.     Breath sounds: Normal breath sounds. No wheezing or rales.  Abdominal:     General: Bowel sounds are normal. There is no distension.     Palpations: Abdomen is soft.     Tenderness: There is no abdominal tenderness. There is no rebound.  Musculoskeletal:     Cervical back: Normal range of motion.  Skin:    General: Skin is warm and dry.  Neurological:     Mental Status: He is alert and oriented to person, place, and time.     Coordination: Coordination normal.    Vitals:   05/19/23 1508  BP: 130/68  Pulse: 73  Temp: 97.6 F (36.4 C)  TempSrc: Temporal  SpO2: 97%  Weight: 168 lb (76.2 kg)  Height: 5\' 8"  (1.727 m)    Assessment & Plan:

## 2023-05-19 NOTE — Assessment & Plan Note (Signed)
BP at goal on amlodipine 5 mg daily and avapro 150 mg daily and checking CMP and CBC and lipid panel. Adjust as needed.

## 2023-05-19 NOTE — Assessment & Plan Note (Signed)
Checking lipid panel and adjust as needed.  

## 2023-05-19 NOTE — Assessment & Plan Note (Signed)
From hep C, no alcohol use and checking CMP today.

## 2023-05-20 ENCOUNTER — Encounter (INDEPENDENT_AMBULATORY_CARE_PROVIDER_SITE_OTHER): Payer: Medicare Other | Admitting: Ophthalmology

## 2023-05-20 DIAGNOSIS — H34832 Tributary (branch) retinal vein occlusion, left eye, with macular edema: Secondary | ICD-10-CM | POA: Diagnosis not present

## 2023-05-20 DIAGNOSIS — I10 Essential (primary) hypertension: Secondary | ICD-10-CM | POA: Diagnosis not present

## 2023-05-20 DIAGNOSIS — H34212 Partial retinal artery occlusion, left eye: Secondary | ICD-10-CM | POA: Diagnosis not present

## 2023-05-20 DIAGNOSIS — H35033 Hypertensive retinopathy, bilateral: Secondary | ICD-10-CM

## 2023-05-20 DIAGNOSIS — H43813 Vitreous degeneration, bilateral: Secondary | ICD-10-CM | POA: Diagnosis not present

## 2023-06-10 ENCOUNTER — Ambulatory Visit (INDEPENDENT_AMBULATORY_CARE_PROVIDER_SITE_OTHER): Payer: Medicare Other | Admitting: Licensed Clinical Social Worker

## 2023-06-10 DIAGNOSIS — F4323 Adjustment disorder with mixed anxiety and depressed mood: Secondary | ICD-10-CM | POA: Diagnosis not present

## 2023-06-10 DIAGNOSIS — F411 Generalized anxiety disorder: Secondary | ICD-10-CM | POA: Insufficient documentation

## 2023-06-10 NOTE — Progress Notes (Signed)
[facility] Counselor/Therapist Progress Note  Patient ID: Timothy Bentley, MRN: [id number]    Date: 06/10/23  Time Spent: 100  pm - 0200 pm : 60 Minutes  Treatment Type: Individual Therapy.  Reported Symptoms: Symptoms of anxiety and depression and guilt related to fearing he will disappoint someone and life changes  Mental Status Exam: Appearance:  Neat     Behavior: Appropriate  Motor: Normal  Speech/Language:  Clear and Coherent  Affect: Appropriate  Mood: normal  Thought process: normal  Thought content:   WNL  Sensory/Perceptual disturbances:   WNL  Orientation: oriented to person, place, time/date, situation, day of week, month of year, and year  Attention: Good  Concentration: Good  Memory: WNL  Fund of knowledge:  Good  Insight:   Good  Judgment:  Good  Impulse Control: Good   Risk Assessment: Danger to Self:  No Self-injurious Behavior: No Danger to Others: No Duty to Warn:no Physical Aggression / Violence:No  Access to Firearms a concern: No  Gang Involvement:No   Subjective:   Timothy Bentley participated from office located at [employer] with Clinician present. Timothy Bentley consented to treatment.   Presenting Problem Chief Complaint: Depression and anxiety related to life changes and guilt.  What are the main stressors in your life right now, how long? Depression  3, Anxiety   3, Mood Swings  3, Loss of Interest   3, Irritability   3, Excessive Worrying   3, Marital Stress   3, Ritualistic Behaviors   3, Checking   3, Counting   3, Change in Sexual Interest   3, and Poor Concentration   3   Previous mental health services Have you ever been treated for a mental health problem, when, where, by whom? No  NA previous therapy for just an outlet.   Are you currently seeing a therapist or counselor, counselor's name? No   Have you ever had a mental health hospitalization, how many times, length of stay? No   Have you ever been treated with  medication, name, reason, response? No NA  Have you ever had suicidal thoughts or attempted suicide, when, how? No NA  Risk factors for Suicide Demographic factors:  Male, Age 70 or older, and Caucasian Current mental status: No plan to harm self or others Loss factors: NA Historical factors: Family history of mental illness or substance abuse Risk Reduction factors: Employed, Living with another person, especially a relative, and Positive social support Clinical factors:  Severe Anxiety and/or Agitation Depression:   Mild Cognitive features that contribute to risk: NA    SUICIDE RISK:  Minimal: No identifiable suicidal ideation.  Patients presenting with no risk factors but with morbid ruminations; may be classified as minimal risk based on the severity of the depressive symptoms  Medical history Medical treatment and/or problems, explain: No NA Do you have any issues with chronic pain?  No  Name of primary care physician/last physical exam: Hillard Danker  Allergies: No Medication, reactions? NA   Current medications:  CIPROFLOXACIN 0.3% EYE DROP New Add as: ciprofloxacin (CILOXAN) 0.3 % ophthalmic solution  SMARTSIG:In Eye(s)    External Pharmacy 05/19/2023       Months of dispense information: 1; Number of dispenses: 1 Dispense date: 05/19/2023 Qty: 5 mL [id number] [place], [place] - [address] AT [address] OF [facility] [address] [phone]  amLODIPine Besylate       amLODipine (NORVASC) 5 MG tablet On Chart Dose: 5 mg Take 1 tablet (  5 mg total) by mouth daily. 07/11/2022  Local Medical Record 06/10/2023    Months of dispense information: 5; Number of dispenses: 1 Dispense date: 01/10/2023 Qty: 90 each [id number] [place], [place] - [address] AT [address] [facility] [address] [phone]     amLODIPine (NORVASC) 10 mg tablet Similar Dose: 5 mg Take 5 mg by mouth Once Daily.  08/20/2022  2 Outside Sources 02/05/2023      Irbesartan       irbesartan (AVAPRO) 150 MG tablet On Chart Dose: 150 mg Take 1 tablet (150 mg total) by mouth daily. 06/18/2022  Local Medical Record 06/10/2023    Months of dispense information: 6; Number of dispenses: 1 Dispense date: 12/30/2022 Qty: 90 each [id number] - [place], [place] - [address] AT [address] OF [facility] [address] [phone]  Ketoconazole       ketoconazole (NIZORAL) 2 % cream On Chart Dose: 1 Application Apply 1 Application topically daily. 05/19/2023  Local Medical Record 06/10/2023    Months of dispense information: 1; Number of dispenses: 1 Dispense date: 05/19/2023 Qty: 60 g Unit strength: 2 Pharmacy: [id number] - [place], [place] - [address] [phone]  Promethazine-DM       promethazine-dextromethorphan (PROMETHAZINE-DM) 6.25-15 MG/5ML syrup On Chart          Prescribed by: Dr. Okey Dupre Is there any history of mental health problems or substance abuse in your family, whom? Yes father was an alcoholic Has anyone in your family been hospitalized, who, where, length of stay? No   Social/family history Have you been married, how many times?  0  Do you have children?  0  How many pregnancies have you had?  0  Who lives in your current household? Patient and a Media planner history: No   Religious/spiritual involvement: NA What religion/faith base are you? Secular humanist  Family of origin (childhood history)  Mother, father and 2 sisters and patient.  [place] did you [place] up[place] How many different homes have you lived? 4 Describe the atmosphere of the household where you grew up: "Chaotic,didn't always know where people were coming from." Do you have siblings, step/half siblings, list names, relation, sex, age? Yes Lucy-74, Jessica-68 bio sisters  Are your parents separated/divorced, when and why? No married for 50 years  Are your parents  alive? No both deceased  Social supports (personal and professional): Girlfriend, Patty  Education How many grades have you completed? post college graduate work or degree Did you have any problems in school, what type? No  Medications prescribed for these problems? No   Employment (financial issues): Full time, denied financial issues.   Legal history: NA   Trauma/Abuse history: Have you ever been exposed to any form of abuse, what type? No   Have you ever been exposed to something traumatic, describe? No   Substance use Do you use Caffeine? No Type, frequency? NA  Do you use Nicotine? No Type, frequency, ppd? NA   Do you use Alcohol? No Type, frequency? NA  How old were you went you first tasted alcohol? 18 Was this accepted by your family? No  When was your last drink, type, how much? 1990, beer  Have you ever used illicit drugs or taken more than prescribed, type, frequency, date of last usage? Yes Marijuana, LSD,heroine, speed, uppers and downers last use at age 68.  Diagnosis AXIS I Adjustment Disorder  with mixed anxiety and depressed mood  AXIS II NO DX  AXIS III @PMH @  AXIS IV Social Support  AXIS V Mild     Treatment Plan:  PT reports being a people pleaser and in turn feels both depressed and anxious. Patient reports that he often feels guilty or selfish when feeling that he needs break or time alone. Patient identified that he also has anxiety and depression related to aging and life changes.   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 and working with others and playing music, I am disciplined."  Support System: Environmental manager- Girlfriend  Psychologist, sport and exercise Therapy  Client Statement of Needs       "I want to tell my story, talk about my stressors and unload that I can't discuss with others and gain self-confidence."  Treatment Level Once Monthly/MILD  Symptoms:  Depression and anxiety  Goals: "I want to tell my story, talk about my stressors and unload that I can't discuss with others and gain self-confidence." Target Date: 06/10/2023 Frequency: Monthly  Progress: 0 Modality: individual    Therapist will provide referrals for additional resources as appropriate.  Therapist will provide psycho-education regarding anxiety and depression related to his diagnosis and corresponding treatment approaches and interventions. Licensed Clinical Social Worker, Phyllis Ginger, LCSW will support the patient's ability to achieve the goals identified. will employ CBT, BA, Problem-solving, Solution Focused, Mindfulness,  coping skills, & other evidenced-based practices will be used to promote progress towards healthy functioning to help manage decrease symptoms associated with his diagnosis.   Reduce overall level, frequency, and intensity of the feelings of depression, anxiety  evidenced by decreased from 6 to 7 days/week to 0 to 1 days/week per client report for at least 3 consecutive months. Verbally express understanding of the relationship between feelings of depression, anxiety and their impact on thinking patterns and behaviors. Verbalize an understanding of the role that distorted thinking plays in creating fears, excessive worry, and ruminations.           Timothy Bentley  participated in the creation of the treatment plan.  Phyllis Ginger MSW, LCSW DATE: 06/10/2023  _________________________________________            Interventions: Cognitive Behavioral Therapy  Diagnosis: Adjustment Disorder with mixed depression and anxiety   Plan: Timothy Bentley is to use CBT, mindfulness and coping skills to help manage decrease symptoms associated with their diagnosis.   Long-term goal:   Timothy Bentley will reduce overall level, frequency, and intensity of the feelings of depression, anxiety and panic evidenced by decreased irritability, negative self talk, and helpless feelings from 6 to 7 days/week  to 0 to 2 days/week per client report for at least 3 consecutive months.  Short-term goal:  Timothy Bentley will verbally express understanding of the relationship between feelings of depression, anxiety and their impact on thinking patterns and behaviors. Verbalize an understanding of the role that distorted thinking plays in creating fears, excessive worry, and ruminations.  Phyllis Ginger MSW, LCSW DATE:06/10/2023

## 2023-06-24 ENCOUNTER — Encounter (INDEPENDENT_AMBULATORY_CARE_PROVIDER_SITE_OTHER): Payer: Medicare Other | Admitting: Ophthalmology

## 2023-06-24 DIAGNOSIS — I10 Essential (primary) hypertension: Secondary | ICD-10-CM

## 2023-06-24 DIAGNOSIS — H2513 Age-related nuclear cataract, bilateral: Secondary | ICD-10-CM | POA: Diagnosis not present

## 2023-06-24 DIAGNOSIS — H35033 Hypertensive retinopathy, bilateral: Secondary | ICD-10-CM | POA: Diagnosis not present

## 2023-06-24 DIAGNOSIS — H34212 Partial retinal artery occlusion, left eye: Secondary | ICD-10-CM

## 2023-06-24 DIAGNOSIS — H34832 Tributary (branch) retinal vein occlusion, left eye, with macular edema: Secondary | ICD-10-CM

## 2023-06-24 DIAGNOSIS — H43813 Vitreous degeneration, bilateral: Secondary | ICD-10-CM

## 2023-07-06 ENCOUNTER — Other Ambulatory Visit: Payer: Self-pay | Admitting: Internal Medicine

## 2023-07-06 DIAGNOSIS — I10 Essential (primary) hypertension: Secondary | ICD-10-CM

## 2023-07-06 DIAGNOSIS — I119 Hypertensive heart disease without heart failure: Secondary | ICD-10-CM

## 2023-07-08 ENCOUNTER — Ambulatory Visit: Payer: Medicare Other | Admitting: Licensed Clinical Social Worker

## 2023-07-08 DIAGNOSIS — F4323 Adjustment disorder with mixed anxiety and depressed mood: Secondary | ICD-10-CM

## 2023-07-08 NOTE — Progress Notes (Signed)
[facility] Counselor/Therapist Progress Note  Patient ID: Timothy Bentley, MRN: [id number]    Date: 07/08/23  Time Spent: 205  pm - 0300 pm : 50 Minutes  Treatment Type: Individual Therapy.  Reported Symptoms: Patient reports feeling"stuck" and that he often allows people to take advantage of him as to not wanting to be a disappointment. Patient reports feeling both anxious and depressed.  Mental Status Exam: Appearance:  Casual     Behavior: Appropriate  Motor: Normal  Speech/Language:  Clear and Coherent  Affect: Appropriate  Mood: normal  Thought process: normal  Thought content:   WNL  Sensory/Perceptual disturbances:   WNL  Orientation: oriented to person, place, time/date, situation, day of week, month of year, and year  Attention: Good  Concentration: Good  Memory: WNL  Fund of knowledge:  Good  Insight:   Good  Judgment:  Good  Impulse Control: Good   Risk Assessment: Danger to Self:  No Self-injurious Behavior: No Danger to Others: No Duty to Warn:no Physical Aggression / Violence:No  Access to Firearms a concern: No  Gang Involvement:No   Subjective:   Timothy Bentley participated from office located at [employer] with Clinician present. Timothy Bentley consented to treatment.   Timothy Bentley presented for his session identifying his feelings  of not  feeling his feelings matter and happiness is not necessary for him. Clinician ask patient to elaborate and he reported that he feels as though his girlfriend and his job"rape" him of his time. Patient reports that he feels he has 2 full time jobs between his job and his girlfriend. Patient stated that he feels he needs a break but doesn't feel comfortable being transparent with his girlfriend of 20 years. Clinician processed with patient that the lack of transparency will further damage the relationship and his own mental wellness. Clinician processed with patient that self-care is a must before we can care for  others.  Interventions: Cognitive Therapy, Supportive Therapy and Problem SolvingClinician processed with patient that the lack of transparency will further damage the relationship and his own mental wellness. Clinician processed with patient that self-care is a must before we can care for others.   Clinician conducted session with patient in person from [employer].   Diagnosis: Adjustment Disorder with mixed anxiety and depression   Plan: Timothy Bentley is to use CBT, mindfulness and coping skills to help manage decrease symptoms associated with their diagnosis.   Long-term goal:   Timothy Bentley will reduce overall level, frequency, and intensity of the feelings of depression, anxiety and panic evidenced by  decreased irritability, negative self talk, and helpless feelings from 6 to 7 days/week to 0 to 2 days/week per client report for at least 3 consecutive months. Treatment plan to be reviewed by 06/09/2024.  Short-term goal:  Timothy Bentley verbally express understanding of the relationship between feelings of depression, anxiety and their impact on thinking patterns and behaviors. Verbalize an understanding of the role that distorted thinking plays in creating fears, excessive worry, and ruminations.  Timothy Bentley MSW, LCSW  DATE: 07/08/2023

## 2023-07-20 ENCOUNTER — Ambulatory Visit (INDEPENDENT_AMBULATORY_CARE_PROVIDER_SITE_OTHER): Payer: Medicare Other

## 2023-07-20 ENCOUNTER — Ambulatory Visit: Payer: Medicare Other | Admitting: Podiatry

## 2023-07-20 ENCOUNTER — Encounter: Payer: Self-pay | Admitting: Podiatry

## 2023-07-20 DIAGNOSIS — S92511A Displaced fracture of proximal phalanx of right lesser toe(s), initial encounter for closed fracture: Secondary | ICD-10-CM | POA: Diagnosis not present

## 2023-07-20 DIAGNOSIS — M778 Other enthesopathies, not elsewhere classified: Secondary | ICD-10-CM | POA: Diagnosis not present

## 2023-07-22 NOTE — Progress Notes (Signed)
Subjective:   Patient ID: Timothy Bentley, male   DOB: 72 y.o.   MRN: [id number]   HPI Patient presents stating that he hit his fifth toe on his right foot 3 weeks ago and has been very sore and swollen and making it hard to wear shoes.  Does not smoke likes to be active   Review of Systems  All other systems reviewed and are negative.       Objective:  Physical Exam Vitals and nursing note reviewed.  Constitutional:      Appearance: He is well-developed.  Pulmonary:     Effort: Pulmonary effort is normal.  Musculoskeletal:        General: Normal range of motion.  Skin:    General: Skin is warm.  Neurological:     Mental Status: He is alert.     Neurovascular status intact muscle strength adequate range of motion adequate swelling and pain of the right fifth digit proximal phalanx painful when pressed no rotational clinical component noted     Assessment:  Probability for fracture of the right fifth digit     Plan:  H&P x-rays reviewed and recommended wider shoes ice compression if needed and it should heal uneventfully but will probably take 8 to 12 weeks  X-rays indicate fracture of the proximal phalanx right hallux in the proximal portion of the bone with no significant dislocation

## 2023-07-27 ENCOUNTER — Ambulatory Visit (INDEPENDENT_AMBULATORY_CARE_PROVIDER_SITE_OTHER): Payer: Medicare Other | Admitting: Licensed Clinical Social Worker

## 2023-07-27 DIAGNOSIS — F4323 Adjustment disorder with mixed anxiety and depressed mood: Secondary | ICD-10-CM | POA: Diagnosis not present

## 2023-07-27 DIAGNOSIS — Z23 Encounter for immunization: Secondary | ICD-10-CM | POA: Diagnosis not present

## 2023-07-27 NOTE — Progress Notes (Signed)
[facility] Counselor/Therapist Progress Note  Patient ID: Timothy Bentley, MRN: [id number]    Date: 07/27/23  Time Spent: 302  pm - 0400 pm : 58 Minutes  Treatment Type: Individual Therapy.  Reported Symptoms: Symptoms of depression and anxiety.  Mental Status Exam: Appearance:  Casual     Behavior: Appropriate  Motor: Normal  Speech/Language:  Clear and Coherent  Affect: Appropriate  Mood: normal  Thought process: normal  Thought content:   WNL  Sensory/Perceptual disturbances:   WNL  Orientation: oriented to person, place, time/date, situation, day of week, month of year, and year  Attention: Good  Concentration: Good  Memory: WNL  Fund of knowledge:  Good  Insight:   Good  Judgment:  Good  Impulse Control: Good   Risk Assessment: Danger to Self:  No Self-injurious Behavior: No Danger to Others: No Duty to Warn:no Physical Aggression / Violence:No  Access to Firearms a concern: No  Gang Involvement:No   Subjective:   Timothy Bentley participated from office, located at [employer] with Clinician present. Timothy Bentley consented to treatment.   Timothy Bentley participated and was fully engaged in discussion. Timothy Bentley shared that he feels he has to please everyone and feels completely controlled by his desire to please and not disappoint others. Patient reports that due to this he struggles daily with his depression and anxiety. Clinician attempted to process with patient the importance of boundaries and that he must have a balance between work, life and his relationship with his girlfriend. Patient had difficulty grasping that just because he desires to have time alone doesn't mean that he doesn't love his girlfriend. Patient shared his insight that he does feel that he has tried to overcompensate for poor choices as a child and a young man through his excessive work and people pleasing. Clinician urged patient to identify things he enjoys and how he can implement them into his life  for our next session.  Interventions: Cognitive Behavioral Therapy, Assertiveness/Communication, and Solution-Oriented/Positive Psychology  Diagnosis: Adjustment Disorder with mixed anxiety and depressed mood.   Plan: Timothy Bentley  is to use CBT, mindfulness and coping skills to help manage decrease symptoms associated with their diagnosis.   Long-term goal:   Timothy Bentley will reduce overall level, frequency, and intensity of the feelings of depression and anxiety evidenced by decreased irritability, negative self talk, and helpless feelings from 6 to 7 days/week to 0 to 2 days/week per client report for at least 3 consecutive months. Treatment plan to be reviewed by 06/09/2024.  Short-term goal:  Timothy Bentley Verbally express understanding of the relationship between feelings of depression, anxiety and their impact on thinking patterns and behaviors. Verbalize an understanding of the role that distorted thinking plays in creating fears, excessive worry, and ruminations.  Phyllis Ginger MSW, LCSW DATE: 07/27/2023

## 2023-07-29 ENCOUNTER — Encounter (INDEPENDENT_AMBULATORY_CARE_PROVIDER_SITE_OTHER): Payer: Medicare Other | Admitting: Ophthalmology

## 2023-08-12 ENCOUNTER — Encounter (INDEPENDENT_AMBULATORY_CARE_PROVIDER_SITE_OTHER): Payer: Medicare Other | Admitting: Ophthalmology

## 2023-08-12 DIAGNOSIS — H34212 Partial retinal artery occlusion, left eye: Secondary | ICD-10-CM | POA: Diagnosis not present

## 2023-08-12 DIAGNOSIS — H43813 Vitreous degeneration, bilateral: Secondary | ICD-10-CM

## 2023-08-12 DIAGNOSIS — H2513 Age-related nuclear cataract, bilateral: Secondary | ICD-10-CM | POA: Diagnosis not present

## 2023-08-12 DIAGNOSIS — H35033 Hypertensive retinopathy, bilateral: Secondary | ICD-10-CM | POA: Diagnosis not present

## 2023-08-12 DIAGNOSIS — I10 Essential (primary) hypertension: Secondary | ICD-10-CM

## 2023-08-12 DIAGNOSIS — H34832 Tributary (branch) retinal vein occlusion, left eye, with macular edema: Secondary | ICD-10-CM | POA: Diagnosis not present

## 2023-08-26 ENCOUNTER — Ambulatory Visit: Payer: Medicare Other | Admitting: Licensed Clinical Social Worker

## 2023-08-26 DIAGNOSIS — F4323 Adjustment disorder with mixed anxiety and depressed mood: Secondary | ICD-10-CM

## 2023-08-26 NOTE — Progress Notes (Signed)
[facility] Counselor/Therapist Progress Note  Patient ID: Timothy Bentley, MRN: [id number]    Date: 08/26/23  Time Spent: 0205  pm - 0300 pm : 55 Minutes  Treatment Type: Individual Therapy.  Reported Symptoms: Symptoms of depression and anxiety due to life changes and responsibilities.   Mental Status Exam: Appearance:  Casual     Behavior: Appropriate  Motor: Normal  Speech/Language:  Clear and Coherent  Affect: Appropriate  Mood: anxious  Thought process: normal  Thought content:   WNL  Sensory/Perceptual disturbances:   WNL  Orientation: oriented to person, place, time/date, situation, day of week, month of year, and year  Attention: Good  Concentration: Good  Memory: WNL  Fund of knowledge:  Good  Insight:   Good  Judgment:  Good  Impulse Control: Good   Risk Assessment: Danger to Self:  No Self-injurious Behavior: No Danger to Others: No Duty to Warn:no Physical Aggression / Violence:No  Access to Firearms a concern: No  Gang Involvement:No   Subjective:   [name] participated from office, located at [employer] with Clinician present. Linzie consented to treatment.   Behavior:   The client expressed feelings of sadness and guilt and difficulty adapting to recent life changes, such as trying to balance his time with both work and his girlfriend and having time to himself. Patient was visibly anxious and feeling overwhelmed due to a feeling of needing time for himself.   Intervention:   Clinician applied supportive counseling to validate the client's feelings. Introduced cognitive restructuring to challenge negative thoughts about his depression and anxiety related to his guilt and attempt to balance his time as to not disappoint anyone. Clinician encouraged patient to explore potential positive aspects of being busy and having someone who cares for him.  Response:   The client responded well to validation and engaged in cognitive  restructuring, though remained somewhat pessimistic about the situation.  Plan:   Encourage the client to lessen his time at work and although continuing to work and be reliable, to decrease his extra time before the next session. Continue cognitive restructuring and start building a new routine to allow himself time for all areas in his life without feeling guilt, anxiety or being depressed due to a lack of time for self-care.  Treatment plan to be reviewed by 06/09/2024.  Interventions: Cognitive Behavioral Therapy  Diagnosis: Adjustment Disorder with mixed anxiety and depression.     Phyllis Ginger MSW, LCSW DATE: 08/26/2023

## 2023-08-27 DIAGNOSIS — H348322 Tributary (branch) retinal vein occlusion, left eye, stable: Secondary | ICD-10-CM | POA: Diagnosis not present

## 2023-09-07 DIAGNOSIS — Z23 Encounter for immunization: Secondary | ICD-10-CM | POA: Diagnosis not present

## 2023-09-14 DIAGNOSIS — L578 Other skin changes due to chronic exposure to nonionizing radiation: Secondary | ICD-10-CM | POA: Diagnosis not present

## 2023-09-14 DIAGNOSIS — L821 Other seborrheic keratosis: Secondary | ICD-10-CM | POA: Diagnosis not present

## 2023-09-14 DIAGNOSIS — D2272 Melanocytic nevi of left lower limb, including hip: Secondary | ICD-10-CM | POA: Diagnosis not present

## 2023-09-14 DIAGNOSIS — L814 Other melanin hyperpigmentation: Secondary | ICD-10-CM | POA: Diagnosis not present

## 2023-09-14 DIAGNOSIS — D2271 Melanocytic nevi of right lower limb, including hip: Secondary | ICD-10-CM | POA: Diagnosis not present

## 2023-09-14 DIAGNOSIS — D225 Melanocytic nevi of trunk: Secondary | ICD-10-CM | POA: Diagnosis not present

## 2023-09-16 ENCOUNTER — Encounter (INDEPENDENT_AMBULATORY_CARE_PROVIDER_SITE_OTHER): Payer: Medicare Other | Admitting: Ophthalmology

## 2023-09-16 DIAGNOSIS — H35033 Hypertensive retinopathy, bilateral: Secondary | ICD-10-CM

## 2023-09-16 DIAGNOSIS — I10 Essential (primary) hypertension: Secondary | ICD-10-CM | POA: Diagnosis not present

## 2023-09-16 DIAGNOSIS — H34832 Tributary (branch) retinal vein occlusion, left eye, with macular edema: Secondary | ICD-10-CM

## 2023-09-16 DIAGNOSIS — H34212 Partial retinal artery occlusion, left eye: Secondary | ICD-10-CM | POA: Diagnosis not present

## 2023-09-16 DIAGNOSIS — H43813 Vitreous degeneration, bilateral: Secondary | ICD-10-CM

## 2023-09-17 ENCOUNTER — Ambulatory Visit: Payer: Medicare Other | Admitting: Licensed Clinical Social Worker

## 2023-10-01 ENCOUNTER — Ambulatory Visit (INDEPENDENT_AMBULATORY_CARE_PROVIDER_SITE_OTHER): Payer: Medicare Other | Admitting: Licensed Clinical Social Worker

## 2023-10-01 DIAGNOSIS — F4323 Adjustment disorder with mixed anxiety and depressed mood: Secondary | ICD-10-CM

## 2023-10-02 NOTE — Progress Notes (Signed)
[facility] Counselor/Therapist Progress Note  Patient ID: Chrstopher Dowtin, MRN: [id number]    Date: 10/01/2023  Time Spent: 0200  pm - 0300 pm : 60 Minutes  Treatment Type: Individual Therapy.  Reported Symptoms: Patient reports increase in depression and anxiety due to his place in life and a desire to have more time for himself.  Mental Status Exam: Appearance:  Casual     Behavior: Appropriate  Motor: Normal  Speech/Language:  Clear and Coherent  Affect: Appropriate  Mood: normal  Thought process: normal  Thought content:   WNL  Sensory/Perceptual disturbances:   WNL  Orientation: oriented to person, place, time/date, situation, day of week, month of year, and year  Attention: Good  Concentration: Good  Memory: WNL  Fund of knowledge:  Good  Insight:   Good  Judgment:  Good  Impulse Control: Good   Risk Assessment: Danger to Self:  No Self-injurious Behavior: No Danger to Others: No Duty to Warn:no Physical Aggression / Violence:No  Access to Firearms a concern: No  Gang Involvement:No   Subjective:   [name] participated from office, located at [employer] with Clinician present. Kamil consented to treatment.   Wayman presented for his session identifying a desire to talk about his long time best friend. Patient reports that his friend is married and has children and grandchildren. Patient states that he cannot relate as he has not experienced these things in his life. He reports that he and his friend always got together at least 6 times a year. Over the past 2 years he has not seen his friend at all. He reports that although they speak on the phone there is not effort for his friend to come for a visit. Tregan reports that he stopped visiting the friend after his friend stated that his wife didn't like him coming. At this point a decision was made that his friend would then come to visit him. Patient reports that he struggles to understand why his  friend avoids visiting and he is hurt by this. Clinician and patient processed how life changes can often affect priorities and previous interest. Clinician processed with patient that his friend may not even realize that this has happened or that it matters so much to patient. Clinician and patient processed the different places that he and his friend are in their lives and that it may be that patient is over thinking the current situation. Clinician pointed out that patient does worry about disappointing others and encouraged him to consider if he is reading too much into the situation. Goals include: Alleviate symptoms of stress-related depression and anxiety through psychotherapy.   Stabilize anxiety and/or depression levels while increasing ability to  function on a daily basis.  Learn and demonstrate strategies to deal with dysphoric and/or anxious moods.  Effectively cope with the full variety of life's stressors.   Patient requested a follow up appointment in one month.  Interventions: Cognitive Behavioral Therapy  Diagnosis: Adjustment Disorder with mixed anxiety and depression    Phyllis Ginger MSW, LCSW/DATE 10/01/2023

## 2023-10-04 ENCOUNTER — Other Ambulatory Visit: Payer: Self-pay | Admitting: Internal Medicine

## 2023-10-04 DIAGNOSIS — I10 Essential (primary) hypertension: Secondary | ICD-10-CM

## 2023-10-04 DIAGNOSIS — I119 Hypertensive heart disease without heart failure: Secondary | ICD-10-CM

## 2023-10-21 ENCOUNTER — Encounter (INDEPENDENT_AMBULATORY_CARE_PROVIDER_SITE_OTHER): Payer: Medicare Other | Admitting: Ophthalmology

## 2023-10-21 DIAGNOSIS — H43813 Vitreous degeneration, bilateral: Secondary | ICD-10-CM

## 2023-10-21 DIAGNOSIS — H35033 Hypertensive retinopathy, bilateral: Secondary | ICD-10-CM

## 2023-10-21 DIAGNOSIS — H34832 Tributary (branch) retinal vein occlusion, left eye, with macular edema: Secondary | ICD-10-CM

## 2023-10-21 DIAGNOSIS — I10 Essential (primary) hypertension: Secondary | ICD-10-CM

## 2023-11-04 ENCOUNTER — Ambulatory Visit: Payer: Medicare Other | Admitting: Licensed Clinical Social Worker

## 2023-11-04 DIAGNOSIS — F4323 Adjustment disorder with mixed anxiety and depressed mood: Secondary | ICD-10-CM

## 2023-11-04 NOTE — Progress Notes (Signed)
[facility] Counselor/Therapist Progress Note  Patient ID: Timothy Bentley, MRN: [id number]    Date: 11/04/23  Time Spent: 202  pm - 0300 pm : 58 Minutes  Treatment Type: Individual Therapy.   Reported Symptoms: Patient reports increase in depression and anxiety due to his place in life and a desire to have more time for himself.   Mental Status Exam: Appearance:  Casual     Behavior: Appropriate  Motor: Normal  Speech/Language:  Clear and Coherent  Affect: Appropriate  Mood: normal  Thought process: normal  Thought content:   WNL  Sensory/Perceptual disturbances:   WNL  Orientation: oriented to person, place, time/date, situation, day of week, month of year, and year  Attention: Good  Concentration: Good  Memory: WNL  Fund of knowledge:  Good  Insight:   Good  Judgment:  Good  Impulse Control: Good    Risk Assessment: Danger to Self:  No Self-injurious Behavior: No Danger to Others: No Duty to Warn:no Physical Aggression / Violence:No  Access to Firearms a concern: No  Gang Involvement:No    Subjective:    [name] participated from office, located at [employer] with Clinician present. Farrell consented to treatment.  Kail presented for his session in a positive mood. He reports that he has had an increase in work as the season is starting to get back in swing. Patient reports that he remains frustrated at his long time friend. He reports that his friend has reached out to him but refuses to take his calls or respond to his texts. Patient states he feels that their friendship is no longer a priority for his friend. Patient reports that he finds himself just backing away and not wanting to explain himself. Patient states that he doesn't like conflict and often finds himself avoiding conversations that may lead to conflict.  Clinician actively listened and processed with patient his feelings and concerns. Clinician encouraged patient to speak with his  friend to verify what may be going on. Clinician also reminded patient that his friend may not even recognize that patient is feeling hurt. Although patient was resistant, Clinician identified that if he doesn't speak to his friend he may one day one day wonder "what if".  Goals include: Alleviate symptoms of stress-related depression and anxiety through psychotherapy.   Stabilize anxiety and/or depression levels while increasing ability to  function on a daily basis.  Learn and demonstrate strategies to deal with dysphoric and/or anxious moods.  Effectively cope with the full variety of life's stressors.    Patient requested a follow up appointment in one month. Treatment plan to be reviewed by 06/09/2024.   Interventions: Cognitive Behavioral Therapy   Diagnosis: Adjustment Disorder with mixed anxiety and depression       Phyllis Ginger MSW, LCSW/DATE 11/04/2023

## 2023-12-02 ENCOUNTER — Encounter (INDEPENDENT_AMBULATORY_CARE_PROVIDER_SITE_OTHER): Payer: Medicare Other | Admitting: Ophthalmology

## 2023-12-02 ENCOUNTER — Ambulatory Visit: Payer: Medicare Other | Admitting: Licensed Clinical Social Worker

## 2023-12-07 ENCOUNTER — Encounter (INDEPENDENT_AMBULATORY_CARE_PROVIDER_SITE_OTHER): Payer: Medicare Other | Admitting: Ophthalmology

## 2023-12-29 ENCOUNTER — Telehealth (HOSPITAL_BASED_OUTPATIENT_CLINIC_OR_DEPARTMENT_OTHER): Payer: Self-pay | Admitting: *Deleted

## 2023-12-29 NOTE — Telephone Encounter (Signed)
 Copied from CRM [id number]. Topic: Appointments - Scheduling Inquiry for Clinic >> Dec 29, 2023 12:37 PM Abundio Miu S wrote: Reason for CRM: Patient is requesting to reschedule his appointment with Phyllis Ginger on 12/30/22.  Callback # [phone]

## 2023-12-29 NOTE — Telephone Encounter (Signed)
 This is not our patient they would need to call [facility]

## 2023-12-30 ENCOUNTER — Ambulatory Visit: Admitting: Licensed Clinical Social Worker

## 2024-01-06 ENCOUNTER — Encounter (INDEPENDENT_AMBULATORY_CARE_PROVIDER_SITE_OTHER): Admitting: Ophthalmology

## 2024-01-06 DIAGNOSIS — H35033 Hypertensive retinopathy, bilateral: Secondary | ICD-10-CM

## 2024-01-06 DIAGNOSIS — H34212 Partial retinal artery occlusion, left eye: Secondary | ICD-10-CM

## 2024-01-06 DIAGNOSIS — I10 Essential (primary) hypertension: Secondary | ICD-10-CM

## 2024-01-06 DIAGNOSIS — H43813 Vitreous degeneration, bilateral: Secondary | ICD-10-CM

## 2024-01-06 DIAGNOSIS — H34832 Tributary (branch) retinal vein occlusion, left eye, with macular edema: Secondary | ICD-10-CM

## 2024-01-06 DIAGNOSIS — H2513 Age-related nuclear cataract, bilateral: Secondary | ICD-10-CM

## 2024-01-11 ENCOUNTER — Ambulatory Visit: Admitting: Licensed Clinical Social Worker

## 2024-01-27 ENCOUNTER — Ambulatory Visit: Admitting: Licensed Clinical Social Worker

## 2024-01-27 DIAGNOSIS — F4323 Adjustment disorder with mixed anxiety and depressed mood: Secondary | ICD-10-CM

## 2024-01-27 NOTE — Progress Notes (Addendum)
 [facility] Counselor/Therapist Progress Note  Patient ID: Timothy Bentley, MRN: [id number]    Date: 01/27/24  Time Spent: 105  pm - 0200 pm : 55 Minutes  Treatment Type: Individual Therapy.  Reported Symptoms: Patient reports increase in depression and anxiety due to his place in life and a desire to have more time for himself.   Mental Status Exam: Appearance:  Casual     Behavior: Appropriate  Motor: Normal  Speech/Language:  Clear and Coherent  Affect: Appropriate  Mood: normal  Thought process: normal  Thought content:   WNL  Sensory/Perceptual disturbances:   WNL  Orientation: oriented to person, place, time/date, situation, day of week, month of year, and year  Attention: Good  Concentration: Good  Memory: WNL  Fund of knowledge:  Good  Insight:   Good  Judgment:  Good  Impulse Control: Good    Risk Assessment: Danger to Self:  No Self-injurious Behavior: No Danger to Others: No Duty to Warn:no Physical Aggression / Violence:No  Access to Firearms a concern: No  Gang Involvement:No    Subjective:    [name] participated from office, located at [employer] with Clinician present. Timothy Bentley consented to treatment.  Timothy Bentley presented for his session frustrated. Timothy Bentley was eager to voice his frustration about his current relationship and how he doesn't feel happy and fulfilled. Timothy Bentley states that he continues to feel that he is trapped and wants to be free. Timothy Bentley reports that he cannot break off the relationship because he doesn't want to hurt her and he doesn't voice his feelings because he fears she will stop loving him, as he has seen her do to friends and family when they upset her. Patient reports that he can see why his mother was very passive because he feels that he is very much like her. Timothy Bentley states that he feels that he will never be out of the current situation and he just does his best to make it work. He reports that he would never tell her how  unhappy he feels.  Clinician provided support via active listening and verbal interaction. Clinician processed with patient that each person is responsible for their own happiness. Clinician pointed out that without communicating with his partner she lacks the opportunity to make changes to improve the relationship. Clinician processed with patient examples and scenarios for healthy communication so that he  could share his feelings in a healthy manner and allow for open communication.  Timothy Bentley is insightful but he is animate that he will not communicate his feelings to his partner.  Timothy Bentley realizes that without being transparent things will not change and he will remain in his current emotional state. Timothy Bentley states that he prefers to stay away from the conflict and learn to make the best of his situation. Timothy Bentley will continue with monthly CBT therapy and treatment plan will be reviewed by 06/09/2024.  Diagnosis: Adjustment Disorder with anxious and depressed mood.  Timothy Bentley MSW, LCSW/DATE 01/27/2024

## 2024-02-03 ENCOUNTER — Encounter (INDEPENDENT_AMBULATORY_CARE_PROVIDER_SITE_OTHER): Admitting: Ophthalmology

## 2024-02-09 ENCOUNTER — Encounter (INDEPENDENT_AMBULATORY_CARE_PROVIDER_SITE_OTHER): Admitting: Ophthalmology

## 2024-02-09 DIAGNOSIS — H35033 Hypertensive retinopathy, bilateral: Secondary | ICD-10-CM

## 2024-02-09 DIAGNOSIS — H34212 Partial retinal artery occlusion, left eye: Secondary | ICD-10-CM | POA: Diagnosis not present

## 2024-02-09 DIAGNOSIS — H2513 Age-related nuclear cataract, bilateral: Secondary | ICD-10-CM | POA: Diagnosis not present

## 2024-02-09 DIAGNOSIS — H43813 Vitreous degeneration, bilateral: Secondary | ICD-10-CM

## 2024-02-09 DIAGNOSIS — I10 Essential (primary) hypertension: Secondary | ICD-10-CM | POA: Diagnosis not present

## 2024-02-09 DIAGNOSIS — H34832 Tributary (branch) retinal vein occlusion, left eye, with macular edema: Secondary | ICD-10-CM | POA: Diagnosis not present

## 2024-02-24 ENCOUNTER — Ambulatory Visit (INDEPENDENT_AMBULATORY_CARE_PROVIDER_SITE_OTHER): Admitting: Licensed Clinical Social Worker

## 2024-02-24 DIAGNOSIS — F4323 Adjustment disorder with mixed anxiety and depressed mood: Secondary | ICD-10-CM

## 2024-02-26 NOTE — Progress Notes (Signed)
 [facility] Counselor/Therapist Progress Note  Patient ID: Timothy Bentley, MRN: [id number]    Date: 02/24/2024  Time Spent: 1103  am - 1201 pm : 58 Minutes  Treatment Type: Individual Therapy.  Reported Symptoms: Patient reports increase in depression and anxiety due to his place in life and a desire to have more time for himself.   Mental Status Exam: Appearance:  Casual     Behavior: Appropriate  Motor: Normal  Speech/Language:  Clear and Coherent  Affect: Appropriate  Mood: normal  Thought process: normal  Thought content:   WNL  Sensory/Perceptual disturbances:   WNL  Orientation: oriented to person, place, time/date, situation, day of week, month of year, and year  Attention: Good  Concentration: Good  Memory: WNL  Fund of knowledge:  Good  Insight:   Good  Judgment:  Good  Impulse Control: Good    Risk Assessment: Danger to Self:  No Self-injurious Behavior: No Danger to Others: No Duty to Warn:no Physical Aggression / Violence:No  Access to Firearms a concern: No  Gang Involvement:No    Subjective:    Timothy Bentley participated from office, located at [employer] with Clinician present. Timothy Bentley consented to treatment.  Today's session focussed on Timothy Bentley continuing to feel "trapped" in his current relationship of 20 years. Timothy Bentley states that he can relate to his Mother in how she stayed in relationships and allowed others to have their way. He admits being passive with his partner and his boss, but inside he feels overwhelmed and desires to have time to himself. Patient states that he avoids conflict at all cost and doesn't feel that his feelings, wants or needs matter. Patient states that he knows he should set boundaries and  but he states he knows that he never will.  Clinician provided support via active listening and verbal interaction with patient. Clinician and patient processed the importance of self-care and self-respect. We discussed that everyone's  needs are important and he has a right to his feelings and desires for alone time. Clinician pointed out that being passive can lead to: Resentment and frustration. Feeling unheard and unseen. Burnout and stress. Difficulty setting boundaries. Loss of self-esteem. Clinician urged patient to Develop Self-Awareness by Identify your needs and desires: Reflect on what brings happiness and fulfillment. Challenge negative thoughts: Recognize and reframe self-deprecating or fearful thoughts. Recognize triggers: Identify situations or interactions that lead to passive behavior.  Build Self-Esteem: Recognize your worth: Understand that your needs and feelings are valuable. Celebrate successes: Acknowledge and appreciate efforts to change. Practice self-compassion: Be kind and understanding towards yourself, especially when you make mistakes. Timothy Bentley will continue with monthly CBT therapy. Treatment plan will be reviewed by 06/09/2024.  Interventions: Cognitive Behavioral TherapyMotivational Interviewing and psycho education. Clinician conducted session in person at clinician's office at [facility]. Reviewed events since last session. Assessed patient's mood since last session and current mood. Clinician reviewed diagnoses and treatment recommendations. Provided psycho education related to diagnoses and treatment.     Diagnosis: Adjustment Disorder with mixed and anxiety and depressed mood.   Keenan Pastor MSW, LCSW/DATE 02/24/2024

## 2024-03-16 ENCOUNTER — Encounter (INDEPENDENT_AMBULATORY_CARE_PROVIDER_SITE_OTHER): Admitting: Ophthalmology

## 2024-03-16 DIAGNOSIS — H35033 Hypertensive retinopathy, bilateral: Secondary | ICD-10-CM

## 2024-03-16 DIAGNOSIS — H2513 Age-related nuclear cataract, bilateral: Secondary | ICD-10-CM

## 2024-03-16 DIAGNOSIS — I10 Essential (primary) hypertension: Secondary | ICD-10-CM

## 2024-03-16 DIAGNOSIS — H43813 Vitreous degeneration, bilateral: Secondary | ICD-10-CM

## 2024-03-16 DIAGNOSIS — H34832 Tributary (branch) retinal vein occlusion, left eye, with macular edema: Secondary | ICD-10-CM

## 2024-03-24 ENCOUNTER — Ambulatory Visit: Admitting: Licensed Clinical Social Worker

## 2024-04-18 ENCOUNTER — Encounter (INDEPENDENT_AMBULATORY_CARE_PROVIDER_SITE_OTHER): Admitting: Ophthalmology

## 2024-04-18 DIAGNOSIS — H34832 Tributary (branch) retinal vein occlusion, left eye, with macular edema: Secondary | ICD-10-CM

## 2024-04-18 DIAGNOSIS — H34212 Partial retinal artery occlusion, left eye: Secondary | ICD-10-CM | POA: Diagnosis not present

## 2024-04-18 DIAGNOSIS — I10 Essential (primary) hypertension: Secondary | ICD-10-CM

## 2024-04-18 DIAGNOSIS — H2513 Age-related nuclear cataract, bilateral: Secondary | ICD-10-CM | POA: Diagnosis not present

## 2024-04-18 DIAGNOSIS — H43813 Vitreous degeneration, bilateral: Secondary | ICD-10-CM | POA: Diagnosis not present

## 2024-04-18 DIAGNOSIS — H35033 Hypertensive retinopathy, bilateral: Secondary | ICD-10-CM

## 2024-04-27 DIAGNOSIS — D225 Melanocytic nevi of trunk: Secondary | ICD-10-CM | POA: Diagnosis not present

## 2024-04-27 DIAGNOSIS — L821 Other seborrheic keratosis: Secondary | ICD-10-CM | POA: Diagnosis not present

## 2024-04-27 DIAGNOSIS — L578 Other skin changes due to chronic exposure to nonionizing radiation: Secondary | ICD-10-CM | POA: Diagnosis not present

## 2024-04-27 DIAGNOSIS — L603 Nail dystrophy: Secondary | ICD-10-CM | POA: Diagnosis not present

## 2024-04-27 DIAGNOSIS — D2271 Melanocytic nevi of right lower limb, including hip: Secondary | ICD-10-CM | POA: Diagnosis not present

## 2024-04-27 DIAGNOSIS — L814 Other melanin hyperpigmentation: Secondary | ICD-10-CM | POA: Diagnosis not present

## 2024-04-27 DIAGNOSIS — D2272 Melanocytic nevi of left lower limb, including hip: Secondary | ICD-10-CM | POA: Diagnosis not present

## 2024-04-28 ENCOUNTER — Ambulatory Visit (INDEPENDENT_AMBULATORY_CARE_PROVIDER_SITE_OTHER): Admitting: Licensed Clinical Social Worker

## 2024-04-28 DIAGNOSIS — F4323 Adjustment disorder with mixed anxiety and depressed mood: Secondary | ICD-10-CM

## 2024-04-29 NOTE — Progress Notes (Addendum)
 [facility] Counselor/Therapist Progress Note  Patient ID: Timothy Bentley, MRN: [id number]    Date: 04/28/2024  Time Spent: 1002  am - 1058 am : 56 Minutes  Treatment Type: Individual Therapy.  Reported Symptoms: Patient reports increase in depression and anxiety due to his place in life and a desire to have more time for himself.   Mental Status Exam: Appearance:  Casual     Behavior: Appropriate  Motor: Normal  Speech/Language:  Clear and Coherent  Affect: Appropriate  Mood: normal  Thought process: normal  Thought content:   WNL  Sensory/Perceptual disturbances:   WNL  Orientation: oriented to person, place, time/date, situation, day of week, month of year, and year  Attention: Good  Concentration: Good  Memory: WNL  Fund of knowledge:  Good  Insight:   Good  Judgment:  Good  Impulse Control: Good    Risk Assessment: Danger to Self:  No Self-injurious Behavior: No Danger to Others: No Duty to Warn:no Physical Aggression / Violence:No  Access to Firearms a concern: No  Gang Involvement:No    Subjective:    [name] participated from office, located at [employer] with Clinician present. Timothy Bentley consented to treatment.  Timothy Bentley presented for his session apologizing for missing his previous session. 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 Patient reports that he feels guilty for not feeling guilty that he isn't working so much. Patient reports that he has always worked and done so excessively. He states that Timothy Bentley has been encouraging him to work less. Timothy Bentley reports that he does recognize that he needs to have a plan for when he plans to retire.   Clinician actively listened and provided support. Clinician processed with patient his feelings about cutting down his work load. Clinician encouraged patient to recognize that he has proven himself as far as his work ethic and he appears to  be placing his worth on his amount of work he does. Clinician processed that with patient. Clinician encouraged patient to enjoy his time not having to work so much and take care of himself. Clinician and patient processed having a plan for how many days he may want to continue working to keep active once retired.   Timothy Bentley was actively involved in session discussion. Timothy Bentley is always polite and cooperative and he is motivated for treatment. Timothy Bentley will utilize coping skills to decrease symptoms. Timothy Bentley will continue to engage in bi weekly therapy. Treatment planning to be reviewed by 06/09/2024.  Interventions: Cognitive Behavioral Therapy, Assertiveness/Communication, Motivational Interviewing, and Solution-Oriented/Positive Psychology  Diagnosis: Adjustment Disorder with mixed anxiety and depressed mood.   Timothy Bentley MSW, LCSW/DATE 04/28/2024

## 2024-05-12 ENCOUNTER — Ambulatory Visit (INDEPENDENT_AMBULATORY_CARE_PROVIDER_SITE_OTHER): Admitting: Licensed Clinical Social Worker

## 2024-05-12 DIAGNOSIS — F4323 Adjustment disorder with mixed anxiety and depressed mood: Secondary | ICD-10-CM | POA: Diagnosis not present

## 2024-05-12 NOTE — Progress Notes (Signed)
 [facility] Counselor/Therapist Progress Note  Patient ID: Timothy Bentley, MRN: [id number]    Date: 05/12/24  Time Spent: 0901  am - 1000 am : 59 Minutes  Treatment Type: Individual Therapy.  Reported Symptoms: Patient reports increase in depression and anxiety due to his place in life and a desire to have more time for himself.   Mental Status Exam: Appearance:  Casual     Behavior: Appropriate  Motor: Normal  Speech/Language:  Clear and Coherent  Affect: Appropriate  Mood: normal  Thought process: normal  Thought content:   WNL  Sensory/Perceptual disturbances:   WNL  Orientation: oriented to person, place, time/date, situation, day of week, month of year, and year  Attention: Good  Concentration: Good  Memory: WNL  Fund of knowledge:  Good  Insight:   Good  Judgment:  Good  Impulse Control: Good    Risk Assessment: Danger to Self:  No Self-injurious Behavior: No Danger to Others: No Duty to Warn:no Physical Aggression / Violence:No  Access to Firearms a concern: No  Gang Involvement:No    Subjective:    [name] participated from office, located at [employer] with Clinician present. Timothy Bentley consented to treatment.  Timothy Bentley presented for his session in a positive mood. He reports that the weather has affected work but he still has things to do. Patient reports that his girlfriend is out of town in [place]  visiting family and he has enjoyed some down time and time alone. Patient speaks frequently that he enjoys alone time and he doesn't get enough of it. He does state that he takes advantage of it when he can. 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 Timothy Bentley reports that he enjoys going to yoga and enjoys this. He reports that it is often a problem for Timothy Bentley as the evening comes she wants him home to spend time with her. Patient states he struggles all the time with guilt as he feels it's important not to disappoint  others.   Clinician actively listened and provided support via verbal feedback. Clinician processed with patient the idea of involving Timothy Bentley in his yoga class plans and encourage her to go with him. Clinician and patient processed the importance of alone time as well as time with family and friends and time together. Clinician and patient discussed that having balance can be difficult to obtain but is essential in living a healthy well rounded life. Clinician encouraged patient to share his feelings without feeling guilty and identifying the effects of not making his feelings a priority as he does every around him.  Timothy Bentley was fully engaged in discussion during session. He was pleasant and cooperative. He will use CBT, mindfulness and coping skills to help manage decrease symptoms associated with his diagnosis. Timothy Bentley will continue to engage in monthly therapy sessions. Treatment planning will be reviewed before 06/09/2024.   Interventions: Cognitive Behavioral Therapy, Motivational Interviewing and psycho education. Clinician conducted session in person at clinician's office at [facility]. Reviewed events since last session. Assessed patient's mood since last session and current mood. Clinician reviewed diagnoses and treatment recommendations. Provided psycho education related to diagnoses and treatment.     Diagnosis: Adjustment Disorder with mixed anxiety and depression    Damien Junk MSW, LCSW/DATE 05/12/2024

## 2024-05-19 ENCOUNTER — Ambulatory Visit

## 2024-05-25 ENCOUNTER — Encounter (INDEPENDENT_AMBULATORY_CARE_PROVIDER_SITE_OTHER): Admitting: Ophthalmology

## 2024-05-30 ENCOUNTER — Encounter (INDEPENDENT_AMBULATORY_CARE_PROVIDER_SITE_OTHER): Admitting: Ophthalmology

## 2024-06-13 ENCOUNTER — Encounter (INDEPENDENT_AMBULATORY_CARE_PROVIDER_SITE_OTHER): Admitting: Ophthalmology

## 2024-06-13 DIAGNOSIS — H35033 Hypertensive retinopathy, bilateral: Secondary | ICD-10-CM | POA: Diagnosis not present

## 2024-06-13 DIAGNOSIS — I10 Essential (primary) hypertension: Secondary | ICD-10-CM

## 2024-06-13 DIAGNOSIS — H43813 Vitreous degeneration, bilateral: Secondary | ICD-10-CM | POA: Diagnosis not present

## 2024-06-13 DIAGNOSIS — H34832 Tributary (branch) retinal vein occlusion, left eye, with macular edema: Secondary | ICD-10-CM

## 2024-06-30 DIAGNOSIS — Z96643 Presence of artificial hip joint, bilateral: Secondary | ICD-10-CM | POA: Diagnosis not present

## 2024-06-30 DIAGNOSIS — S7001XA Contusion of right hip, initial encounter: Secondary | ICD-10-CM | POA: Diagnosis not present

## 2024-07-05 ENCOUNTER — Other Ambulatory Visit: Payer: Self-pay | Admitting: Internal Medicine

## 2024-07-05 DIAGNOSIS — I119 Hypertensive heart disease without heart failure: Secondary | ICD-10-CM

## 2024-07-05 DIAGNOSIS — I10 Essential (primary) hypertension: Secondary | ICD-10-CM

## 2024-07-13 ENCOUNTER — Ambulatory Visit: Admitting: Licensed Clinical Social Worker

## 2024-07-13 ENCOUNTER — Encounter (INDEPENDENT_AMBULATORY_CARE_PROVIDER_SITE_OTHER): Admitting: Ophthalmology

## 2024-07-13 DIAGNOSIS — H35033 Hypertensive retinopathy, bilateral: Secondary | ICD-10-CM

## 2024-07-13 DIAGNOSIS — H34832 Tributary (branch) retinal vein occlusion, left eye, with macular edema: Secondary | ICD-10-CM | POA: Diagnosis not present

## 2024-07-13 DIAGNOSIS — H34212 Partial retinal artery occlusion, left eye: Secondary | ICD-10-CM

## 2024-07-13 DIAGNOSIS — I10 Essential (primary) hypertension: Secondary | ICD-10-CM | POA: Diagnosis not present

## 2024-07-13 DIAGNOSIS — F4323 Adjustment disorder with mixed anxiety and depressed mood: Secondary | ICD-10-CM

## 2024-07-13 DIAGNOSIS — H43813 Vitreous degeneration, bilateral: Secondary | ICD-10-CM | POA: Diagnosis not present

## 2024-07-13 DIAGNOSIS — H2513 Age-related nuclear cataract, bilateral: Secondary | ICD-10-CM

## 2024-07-13 NOTE — Progress Notes (Unsigned)
 [facility] Counselor/Therapist Progress Note  Patient ID: Timothy Bentley, MRN: [id number]    Date: 07/13/24  Time Spent: 0204  pm - 0300 pm : 56 Minutes  Treatment Type: Individual Therapy.  Reported Symptoms: Patient reports increase in depression and anxiety due to his place in life and a desire to have more time for himself.   Mental Status Exam: Appearance:  Casual     Behavior: Appropriate  Motor: Normal  Speech/Language:  Clear and Coherent  Affect: Appropriate  Mood: normal  Thought process: normal  Thought content:   WNL  Sensory/Perceptual disturbances:   WNL  Orientation: oriented to person, place, time/date, situation, day of week, month of year, and year  Attention: Good  Concentration: Good  Memory: WNL  Fund of knowledge:  Good  Insight:   Good  Judgment:  Good  Impulse Control: Good    Risk Assessment: Danger to Self:  No Self-injurious Behavior: No Danger to Others: No Duty to Warn:no Physical Aggression / Violence:No  Access to Firearms a concern: No  Gang Involvement:No    Subjective:    [name] participated from office, located at [employer] with Clinician present. Johnpatrick consented to treatment.  Elishua presented for his session stating that it has been a busy summer. He reports that he and Patty have reached a plateau in their relationship. He reports that she likes being at home and is not interested in doing much outside. He does say she enjoys travel but at this point they are unable to travel much due to him working. He reports that she is often very negative and he can feel it affecting him in a negative manner. Kellen states that he is unsure of how to attempt to motivate her without her feeling offended. Mortimer states that he would like her to see the good in life and not dwell on the negative. Vuk reports that his friend came to visit and they had a wonderful time. He reports that they ate and played music and enjoyed their time  together. Draycen reported that this was very refreshing.  Clinician actively listened and provided support and encouragement. Clinician engaged with patient and discussed with him motivational skills. Clinician identified it as the  abilities that inspire oneself or others to take action and achieve goals, encompassing traits like self-awareness, initiative, and positive attitude for self-motivation, and effective communication, empathy, and goal setting for motivating others. Key techniques include setting clear, achievable goals, creating positive environments, providing constructive feedback, recognizing accomplishments, and offering opportunities for growth and development.    Jarrah was fully engaged in session discussion and was motivated for treatment as evidenced by his honesty and self awareness and insight into others around him behavior. Shaquel will use CBT, mindfulness and coping skills to help manage decrease symptoms associated with their diagnosis.  Interventions: Cognitive Behavioral Therapy, Motivational Interviewing and psycho education. Clinician conducted session in person at clinician's office at [facility]. Reviewed events since last session. Assessed patient's mood since last session and current mood. Clinician reviewed diagnoses and treatment recommendations. Provided psycho education related to diagnoses and treatment.      Diagnosis: Adjustment Disorder with mixed anxiety and depression   Damien Junk MSW, LCSW/DATE 07/14/2024   Treatment Plan Strengths/Abilities:  Intelligent, Intuitive, Willing to participate in therapy Treatment Preferences:  Outpatient Individual Therapy Statement of Needs:  Patient is to use CBT, mindfulness and coping skills to help manage and/or decrease symptoms associated with their diagnosis. Symptoms:  Depressed/Irritable mood,  worry, social withdrawal Problems Addressed:  Depressive thoughts, Sadness, Sleep issues, etc. Long Term Goals:  Pt  to reduce overall level, frequency, and intensity of the feelings of depression/anxiety as evidenced by decreased irritability, negative self talk, and helpless feelings from 6 to 7 days/week to 0 to 1 days/week, per client report, for at least 3 consecutive months.  Progress:  Short Term Goals:  Pt to verbally express understanding of the relationship between feelings of depression/anxiety and their impact on thinking patterns and behaviors.  Pt to verbalize an understanding of the role that distorted thinking plays in creating fears, excessive worry, and ruminations.  Progress:  Target Date:  07/13/2025 Frequency:  Bi-weekly Modality:  Cognitive Behavioral Therapy Interventions by Therapist:  Therapist will use CBT, Mindfulness exercises, Coping skills and Referrals, as needed by client. Client has verbally approved this treatment plan.  Damien Junk MSW, LCSW DATE: 07/13/2024

## 2024-07-20 ENCOUNTER — Encounter: Payer: Self-pay | Admitting: Internal Medicine

## 2024-07-20 ENCOUNTER — Ambulatory Visit: Payer: Self-pay | Admitting: Internal Medicine

## 2024-07-20 ENCOUNTER — Ambulatory Visit: Admitting: Internal Medicine

## 2024-07-20 VITALS — BP 128/84 | HR 67 | Temp 98.6°F | Ht 68.0 in | Wt 163.0 lb

## 2024-07-20 DIAGNOSIS — Z0000 Encounter for general adult medical examination without abnormal findings: Secondary | ICD-10-CM

## 2024-07-20 DIAGNOSIS — E785 Hyperlipidemia, unspecified: Secondary | ICD-10-CM

## 2024-07-20 DIAGNOSIS — Z23 Encounter for immunization: Secondary | ICD-10-CM

## 2024-07-20 DIAGNOSIS — I10 Essential (primary) hypertension: Secondary | ICD-10-CM

## 2024-07-20 DIAGNOSIS — N400 Enlarged prostate without lower urinary tract symptoms: Secondary | ICD-10-CM

## 2024-07-20 LAB — COMPREHENSIVE METABOLIC PANEL WITH GFR
ALT: 21 U/L (ref 0–53)
AST: 21 U/L (ref 0–37)
Albumin: 4.7 g/dL (ref 3.5–5.2)
Alkaline Phosphatase: 67 U/L (ref 39–117)
BUN: 22 mg/dL (ref 6–23)
CO2: 29 meq/L (ref 19–32)
Calcium: 9.4 mg/dL (ref 8.4–10.5)
Chloride: 105 meq/L (ref 96–112)
Creatinine, Ser: 0.86 mg/dL (ref 0.40–1.50)
GFR: 87.3 mL/min (ref >60.00)
Glucose, Bld: 97 mg/dL (ref 70–99)
Potassium: 4.1 meq/L (ref 3.5–5.1)
Sodium: 140 meq/L (ref 135–145)
Total Bilirubin: 1 mg/dL (ref 0.2–1.2)
Total Protein: 6.8 g/dL (ref 6.0–8.3)

## 2024-07-20 LAB — LIPID PANEL
Cholesterol: 146 mg/dL (ref 0–200)
HDL: 46.8 mg/dL (ref >39.00)
LDL Cholesterol: 81 mg/dL (ref 0–99)
NonHDL: 99.39
Total CHOL/HDL Ratio: 3
Triglycerides: 94 mg/dL (ref 0.0–149.0)
VLDL: 18.8 mg/dL (ref 0.0–40.0)

## 2024-07-20 LAB — CBC
HCT: 45.9 % (ref 39.0–52.0)
Hemoglobin: 15.6 g/dL (ref 13.0–17.0)
MCHC: 33.9 g/dL (ref 30.0–36.0)
MCV: 90.8 fl (ref 78.0–100.0)
Platelets: 141 K/uL — ABNORMAL LOW (ref 150.0–400.0)
RBC: 5.05 Mil/uL (ref 4.22–5.81)
RDW: 13.1 % (ref 11.5–15.5)
WBC: 4.3 K/uL (ref 4.0–10.5)

## 2024-07-20 LAB — PSA: PSA: 0.71 ng/mL (ref 0.10–4.00)

## 2024-07-20 NOTE — Progress Notes (Signed)
   Subjective:   Patient ID: Timothy Bentley, male    DOB: 05-02-1951, 73 y.o.   MRN: [id number]  The patient is here for physical. Pertinent topics discussed: Discussed the use of AI scribe software for clinical note transcription with the patient, who gave verbal consent to proceed.  History of Present Illness Timothy Bentley is a 73 year old male who presents for an annual physical exam.  He has a history of branch vein occlusion, which continues to affect his vision, particularly impacting his ability to read. He sees his eye doctor every month to six weeks.  PMH, [facility], social history reviewed and updated  Review of Systems  Constitutional: Negative.   HENT: Negative.    Eyes: Negative.   Respiratory:  Negative for cough, chest tightness and shortness of breath.   Cardiovascular:  Negative for chest pain, palpitations and leg swelling.  Gastrointestinal:  Negative for abdominal distention, abdominal pain, constipation, diarrhea, nausea and vomiting.  Musculoskeletal: Negative.   Skin: Negative.   Neurological: Negative.   Psychiatric/Behavioral: Negative.      Objective:  Physical Exam Constitutional:      Appearance: He is well-developed.  HENT:     Head: Normocephalic and atraumatic.  Cardiovascular:     Rate and Rhythm: Normal rate and regular rhythm.  Pulmonary:     Effort: Pulmonary effort is normal. No respiratory distress.     Breath sounds: Normal breath sounds. No wheezing or rales.  Abdominal:     General: Bowel sounds are normal. There is no distension.     Palpations: Abdomen is soft.     Tenderness: There is no abdominal tenderness.  Musculoskeletal:     Cervical back: Normal range of motion.  Skin:    General: Skin is warm and dry.  Neurological:     Mental Status: He is alert and oriented to person, place, and time.     Coordination: Coordination normal.     Vitals:   07/20/24 1111  BP: 128/84  Pulse: 67  Temp: 98.6 F (37 C)  TempSrc: Oral  SpO2:  98%  Weight: 163 lb (73.9 kg)  Height: 5' 8 (1.727 m)    Assessment & Plan:  Flu shot given at visit

## 2024-07-20 NOTE — Assessment & Plan Note (Signed)
 Checking PSA and stable.

## 2024-07-20 NOTE — Assessment & Plan Note (Signed)
 BP at goal on meds. Checking CMP and adjust as needed.

## 2024-07-20 NOTE — Assessment & Plan Note (Signed)
 Checking lipid panel and adjust as needed.

## 2024-07-20 NOTE — Assessment & Plan Note (Signed)
Flu shot given. Pneumonia complete. Shingrix counseled. Tetanus up to date. Colonoscopy up to date. Counseled about sun safety and mole surveillance. Counseled about the dangers of distracted driving. Given 10 year screening recommendations.

## 2024-08-11 ENCOUNTER — Ambulatory Visit: Admitting: Licensed Clinical Social Worker

## 2024-08-17 ENCOUNTER — Encounter (INDEPENDENT_AMBULATORY_CARE_PROVIDER_SITE_OTHER): Admitting: Ophthalmology

## 2024-08-17 DIAGNOSIS — H2513 Age-related nuclear cataract, bilateral: Secondary | ICD-10-CM | POA: Diagnosis not present

## 2024-08-17 DIAGNOSIS — H43813 Vitreous degeneration, bilateral: Secondary | ICD-10-CM

## 2024-08-17 DIAGNOSIS — I10 Essential (primary) hypertension: Secondary | ICD-10-CM | POA: Diagnosis not present

## 2024-08-17 DIAGNOSIS — H35033 Hypertensive retinopathy, bilateral: Secondary | ICD-10-CM

## 2024-08-17 DIAGNOSIS — H34832 Tributary (branch) retinal vein occlusion, left eye, with macular edema: Secondary | ICD-10-CM | POA: Diagnosis not present

## 2024-09-07 ENCOUNTER — Ambulatory Visit: Admitting: Licensed Clinical Social Worker

## 2024-09-07 DIAGNOSIS — F4323 Adjustment disorder with mixed anxiety and depressed mood: Secondary | ICD-10-CM

## 2024-09-07 NOTE — Progress Notes (Signed)
 [facility] Counselor/Therapist Progress Note  Patient ID: Timothy Bentley, MRN: [id number]    Date: 09/07/24  Time Spent: 0202  pm - 0300 pm : 58 Minutes  Treatment Type: Individual Therapy.  Reported Symptoms: Patient reports increase in depression and anxiety due to his place in life and a desire to have more time for himself.   Mental Status Exam: Appearance:  Casual     Behavior: Appropriate  Motor: Normal  Speech/Language:  Clear and Coherent  Affect: Appropriate  Mood: normal  Thought process: normal  Thought content:   WNL  Sensory/Perceptual disturbances:   WNL  Orientation: oriented to person, place, time/date, situation, day of week, month of year, and year  Attention: Good  Concentration: Good  Memory: WNL  Fund of knowledge:  Good  Insight:   Good  Judgment:  Good  Impulse Control: Good    Risk Assessment: Danger to Self:  No Self-injurious Behavior: No Danger to Others: No Duty to Warn:no Physical Aggression / Violence:No  Access to Firearms a concern: No  Gang Involvement:No    Subjective:    [name] participated from office, located at [employer] with Clinician present. Timothy Bentley consented to treatment.  Timothy Bentley presented for his session stating that he had a good holiday. He reports that his sisters came to visit and it was a great time. He reports that he is concerned about his sister and her way of life. He reports that his sister Timothy Bentley is more successful. He feels that his family never knew what to do with her.Patient states that he has been able to get over his thoughts about her quirkiness. Timothy Bentley reports enjoying his time with family and feeling less stressed. He reports that his hours at work have slowed down which gives him time to enjoy at home. Timothy Bentley was more joyful and happy during this session.  Clinician actively listened and engaged in session. Clinician processed with patient his report of feeling guilty when he needs time alone  and to himself. Clinician reminded patient to reframe his thinking: Remind yourself that self-care is essential, not a luxury. It's an investment in your overall well-being. Set Boundaries: Learn to say no when you need rest or time for yourself. Boundaries help you protect your energy and mental health.  Timothy Bentley was fully engaged in discussion during session. He was pleasant and cooperative. He will use CBT, mindfulness and coping skills to help manage decrease symptoms associated with his diagnosis. Timothy Bentley will continue to engage in monthly therapy sessios. Treatment planning will be reviewed before 07/13/2025.   Interventions: Cognitive Behavioral Therapy, Motivational Interviewing and psycho education. Clinician conducted session in person at clinician's office at [facility]. Reviewed events since last session. Assessed patient's mood since last session and current mood. Clinician reviewed diagnoses and treatment recommendations. Provided psycho education related to diagnoses and treatment.    CBT  Diagnosis: Adjustment Disorder with mixed anxiety and depression   Treatment Plan Strengths/Abilities:  Intelligent, Intuitive, Willing to participate in therapy Treatment Preferences:  Outpatient Individual Therapy Statement of Needs:  Patient is to use CBT, mindfulness and coping skills to help manage and/or decrease symptoms associated with their diagnosis. Symptoms:  Depressed/Irritable mood, worry, social withdrawal Problems Addressed:  Depressive thoughts, Sadness, Sleep issues, etc. Long Term Goals:  Pt to reduce overall level, frequency, and intensity of the feelings of depression/anxiety as evidenced by decreased irritability, negative self talk, and helpless feelings from 6 to 7 days/week to 0 to 1 days/week, per client  report, for at least 3 consecutive months.  Progress:  Short Term Goals:  Pt to verbally express understanding of the relationship between feelings of depression/anxiety  and their impact on thinking patterns and behaviors.  Pt to verbalize an understanding of the role that distorted thinking plays in creating fears, excessive worry, and ruminations.  Progress:  Target Date:  07/13/2025 Frequency:  Bi-weekly Modality:  Cognitive Behavioral Therapy Interventions by Therapist:  Therapist will use CBT, Mindfulness exercises, Coping skills and Referrals, as needed by client. Client has verbally approved this treatment plan.   Damien Junk MSW, LCSW    Damien Junk MSW, LCSW/DATE 09/07/2024

## 2024-09-21 ENCOUNTER — Encounter (INDEPENDENT_AMBULATORY_CARE_PROVIDER_SITE_OTHER): Admitting: Ophthalmology

## 2024-09-21 DIAGNOSIS — H35033 Hypertensive retinopathy, bilateral: Secondary | ICD-10-CM

## 2024-09-21 DIAGNOSIS — H2513 Age-related nuclear cataract, bilateral: Secondary | ICD-10-CM

## 2024-09-21 DIAGNOSIS — L821 Other seborrheic keratosis: Secondary | ICD-10-CM | POA: Diagnosis not present

## 2024-09-21 DIAGNOSIS — D2272 Melanocytic nevi of left lower limb, including hip: Secondary | ICD-10-CM | POA: Diagnosis not present

## 2024-09-21 DIAGNOSIS — B351 Tinea unguium: Secondary | ICD-10-CM | POA: Diagnosis not present

## 2024-09-21 DIAGNOSIS — D225 Melanocytic nevi of trunk: Secondary | ICD-10-CM | POA: Diagnosis not present

## 2024-09-21 DIAGNOSIS — L578 Other skin changes due to chronic exposure to nonionizing radiation: Secondary | ICD-10-CM | POA: Diagnosis not present

## 2024-09-21 DIAGNOSIS — I10 Essential (primary) hypertension: Secondary | ICD-10-CM

## 2024-09-21 DIAGNOSIS — H34832 Tributary (branch) retinal vein occlusion, left eye, with macular edema: Secondary | ICD-10-CM | POA: Diagnosis not present

## 2024-09-21 DIAGNOSIS — D2271 Melanocytic nevi of right lower limb, including hip: Secondary | ICD-10-CM | POA: Diagnosis not present

## 2024-09-21 DIAGNOSIS — H43813 Vitreous degeneration, bilateral: Secondary | ICD-10-CM

## 2024-09-21 DIAGNOSIS — L820 Inflamed seborrheic keratosis: Secondary | ICD-10-CM | POA: Diagnosis not present

## 2024-09-21 DIAGNOSIS — L814 Other melanin hyperpigmentation: Secondary | ICD-10-CM | POA: Diagnosis not present

## 2024-10-12 ENCOUNTER — Ambulatory Visit: Admitting: Licensed Clinical Social Worker

## 2024-10-12 DIAGNOSIS — F4323 Adjustment disorder with mixed anxiety and depressed mood: Secondary | ICD-10-CM | POA: Diagnosis not present

## 2024-10-12 NOTE — Progress Notes (Signed)
 [facility] Counselor/Therapist Progress Note  Patient ID: Timothy Bentley, MRN: [id number]    Date: 10/12/2024  Time Spent: 0303 pm - 0400 pm : 57 Minutes  Treatment Type: Individual Therapy.  Reported Symptoms: Patient reports increase in depression and anxiety due to his place in life and a desire to have more time for himself.   Mental Status Exam: Appearance:  Casual     Behavior: Appropriate  Motor: Normal  Speech/Language:  Clear and Coherent  Affect: Appropriate  Mood: normal  Thought process: normal  Thought content:   WNL  Sensory/Perceptual disturbances:   WNL  Orientation: oriented to person, place, time/date, situation, day of week, month of year, and year  Attention: Good  Concentration: Good  Memory: WNL  Fund of knowledge:  Good  Insight:   Good  Judgment:  Good  Impulse Control: Good    Risk Assessment: Danger to Self:  No Self-injurious Behavior: No Danger to Others: No Duty to Warn:no Physical Aggression / Violence:No  Access to Firearms a concern: No  Gang Involvement:No    Subjective:    [name] participated from office, located at [employer] with Clinician present. Timothy Bentley consented to treatment.  Timothy Bentley presented for his session reporting that he had a good holiday and feels that things in his life are going well. He reports that 2025 was a good year for him and he hopes that it continues. He reports that he is anxious about waiting for things to go bad but remains in good spirits and positive. Timothy Bentley shared details of his youth and how he has struggled throughout his life as to forgive himself and recognize that it was a phase and he is not the same person as he was back then. Timothy Bentley reports that he is learning to take time for himself in the morning and go to the gym and have breakfast by himself. He reports that he and [name] are doing well and he feels that they have got to a good place in their relationship. Timothy Bentley reports that he feels a  decrease in his symptoms of depression and anxiety and it's likely due to his situation improving.  Clinician actively listened and provided support and encouragement. Clinician processed with patient the importance of forgiving himself and recognizing he has made a good life for himself despite his past. Clinician identified that to forgive your past, acknowledge your actions and feelings without judgment, practice self-compassion by treating yourself kindly, make amends if possible, learn from mistakes, and understand that forgiveness is a process of releasing pain, not condoning behavior, ultimately allowing you to grow and find peace. Key steps involve taking responsibility, expressing remorse, making restoration (if needed), and renewing your commitment to growth. Clinician and patient processed that he has completed all of the steps years ago so not it's time to let it go.  Timothy Bentley was fully engaged in discussion during session. He was pleasant and cooperative. He will use CBT, mindfulness and coping skills to help manage decrease symptoms associated with his diagnosis. Timothy Bentley will continue to engage in monthly therapy sessios. Treatment planning will be reviewed before 07/13/2025.   Interventions: Cognitive Behavioral Therapy, Motivational Interviewing and psycho education. Clinician conducted session in person at clinician's office at [facility]. Reviewed events since last session. Assessed patient's mood since last session and current mood. Clinician reviewed diagnoses and treatment recommendations. Provided psycho education related to diagnoses and treatment. Treatment plan to be reviewed by 09/07/2024.   CBT   Diagnosis: Adjustment  Disorder with mixed anxiety and depression   Treatment Plan Strengths/Abilities:  Intelligent, Intuitive, Willing to participate in therapy Treatment Preferences:  Outpatient Individual Therapy Statement of Needs:  Patient is to use CBT, mindfulness and coping  skills to help manage and/or decrease symptoms associated with their diagnosis. Symptoms:  Depressed/Irritable mood, worry, social withdrawal Problems Addressed:  Depressive thoughts, Sadness, Sleep issues, etc. Long Term Goals:  Pt to reduce overall level, frequency, and intensity of the feelings of depression/anxiety as evidenced by decreased irritability, negative self talk, and helpless feelings from 6 to 7 days/week to 0 to 1 days/week, per client report, for at least 3 consecutive months.  Progress:  Short Term Goals:  Pt to verbally express understanding of the relationship between feelings of depression/anxiety and their impact on thinking patterns and behaviors.  Pt to verbalize an understanding of the role that distorted thinking plays in creating fears, excessive worry, and ruminations.  Progress:  Target Date:  07/13/2025 Frequency:  Bi-weekly Modality:  Cognitive Behavioral Therapy Interventions by Therapist:  Therapist will use CBT, Mindfulness exercises, Coping skills and Referrals, as needed by client. Client has verbally approved this treatment plan.   Damien Junk MSW, LCSW/DATE 10/12/2024

## 2024-10-14 ENCOUNTER — Other Ambulatory Visit: Payer: Self-pay | Admitting: Internal Medicine

## 2024-10-14 DIAGNOSIS — I119 Hypertensive heart disease without heart failure: Secondary | ICD-10-CM

## 2024-10-14 DIAGNOSIS — I10 Essential (primary) hypertension: Secondary | ICD-10-CM

## 2024-10-28 ENCOUNTER — Ambulatory Visit

## 2024-10-28 VITALS — BP 110/70 | HR 69 | Ht 68.0 in | Wt 165.4 lb

## 2024-10-28 DIAGNOSIS — Z0000 Encounter for general adult medical examination without abnormal findings: Secondary | ICD-10-CM | POA: Diagnosis not present

## 2024-10-28 NOTE — Patient Instructions (Addendum)
 Timothy Bentley,  Thank you for taking the time for your Medicare Wellness Visit. I appreciate your continued commitment to your health goals. Please review the care plan we discussed, and feel free to reach out if I can assist you further.  Please note that Annual Wellness Visits do not include a physical exam. Some assessments may be limited, especially if the visit was conducted virtually. If needed, we may recommend an in-person follow-up with your provider.  Ongoing Care Seeing your primary care provider every 3 to 6 months helps us  monitor your health and provide consistent, personalized care. Last office visit 07/20/2024.  You due for a tetanus vaccine.  Remember that you will be due for a colonoscopy at the end of this year.  Keep up the good work.   Referrals If a referral was made during today's visit and you haven't received any updates within two weeks, please contact the referred provider directly to check on the status.  Recommended Screenings:  Health Maintenance  Topic Date Due   Zoster (Shingles) Vaccine (1 of 2) Never done   DTaP/Tdap/Td vaccine (3 - Td or Tdap) 11/23/2023   Medicare Annual Wellness Visit  05/18/2024   COVID-19 Vaccine (4 - 2025-26 season) 06/06/2024   Colon Cancer Screening  09/09/2025   Pneumococcal Vaccine for age over 36  Completed   Flu Shot  Completed   Hepatitis C Screening  Completed   Meningitis B Vaccine  Aged Out   Hepatitis B Vaccine  Discontinued       10/28/2024    3:23 PM  Advanced Directives  Does Patient Have a Medical Advance Directive? Yes  Type of Estate Agent of Black;Living will  Copy of Healthcare Power of Attorney in Chart? No - copy requested    Vision: Annual vision screenings are recommended for early detection of glaucoma, cataracts, and diabetic retinopathy. These exams can also reveal signs of chronic conditions such as diabetes and high blood pressure.  Dental: Annual dental screenings help  detect early signs of oral cancer, gum disease, and other conditions linked to overall health, including heart disease and diabetes.  Please see the attached documents for additional preventive care recommendations.

## 2024-10-28 NOTE — Progress Notes (Signed)
 "  Chief Complaint  Patient presents with   Medicare Wellness     Subjective:   Timothy Bentley is a 74 y.o. male who presents for a Medicare Annual Wellness Visit.  Visit info / Clinical Intake: Medicare Wellness Visit Type:: Initial Annual Wellness Visit Persons participating in visit and providing information:: patient Medicare Wellness Visit Mode:: In-person (required for WTM) Interpreter Needed?: No Pre-visit prep was completed: yes AWV questionnaire completed by patient prior to visit?: no Living arrangements:: (!) lives alone Patient's Overall Health Status Rating: excellent Typical amount of pain: none Does pain affect daily life?: no Are you currently prescribed opioids?: no  Dietary Habits and Nutritional Risks How many meals a day?: 3 Eats fruit and vegetables daily?: yes Most meals are obtained by: preparing own meals In the last 2 weeks, have you had any of the following?: none Diabetic:: no  Functional Status Activities of Daily Living (to include ambulation/medication): Independent Ambulation: Independent with device- listed below Home Assistive Devices/Equipment: Eyeglasses Medication Administration: Independent Home Management (perform basic housework or laundry): Independent Manage your own finances?: yes Primary transportation is: driving Concerns about vision?: no *vision screening is required for WTM* Concerns about hearing?: no  Fall Screening Falls in the past year?: 0 Number of falls in past year: 0 Was there an injury with Fall?: 0 Fall Risk Category Calculator: 0 Patient Fall Risk Level: Low Fall Risk  Fall Risk Patient at Risk for Falls Due to: No Fall Risks Fall risk Follow up: Falls evaluation completed; Falls prevention discussed  Home and Transportation Safety: All rugs have non-skid backing?: N/A, no rugs All stairs or steps have railings?: yes (inside and outside) Grab bars in the bathtub or shower?: (!) no Have non-skid surface in  bathtub or shower?: (!) no Good home lighting?: yes Regular seat belt use?: yes Hospital stays in the last year:: no  Cognitive Assessment Difficulty concentrating, remembering, or making decisions? : no Will 6CIT or Mini Cog be Completed: no 6CIT or Mini Cog Declined: patient alert, oriented, able to answer questions appropriately and recall recent events  Advance Directives (For Healthcare) Does Patient Have a Medical Advance Directive?: Yes Type of Advance Directive: Healthcare Power of [place]; Living will Copy of Healthcare Power of Attorney in Chart?: No - copy requested Copy of Living Will in Chart?: No - copy requested  Reviewed/Updated  Reviewed/Updated: Reviewed All (Medical, Surgical, Family, Medications, Allergies, Care Teams, Patient Goals)    Allergies (verified) Patient has no known allergies.   Current Medications (verified) Outpatient Encounter Medications as of 10/28/2024  Medication Sig   amLODipine  (NORVASC ) 5 MG tablet TAKE 1 TABLET (5 MG TOTAL) BY MOUTH DAILY.   irbesartan  (AVAPRO ) 150 MG tablet TAKE 1 TABLET BY MOUTH EVERY DAY   ketoconazole  (NIZORAL ) 2 % cream Apply 1 Application topically daily.   No facility-administered encounter medications on file as of 10/28/2024.    History: Past Medical History:  Diagnosis Date   Anal condyloma    Arthritis    both hips   Diverticulosis    Dr Kristie   Heart murmur    Hepatitis C    Genotype 23 in 2000; seen @ [facility], treatment declined   History of kidney stones    Nephrolithiasis 2002   X 1   Thrombocytopenia 2010   Platelet count 99,000   Past Surgical History:  Procedure Laterality Date   COLONOSCOPY  10/06/2002   Tics   excision anal condyloma  02/05/2012   Dr Lily  FLEXIBLE SIGMOIDOSCOPY  10/06/2010   Dr Kristie for evaluation of venereal warts   JOINT REPLACEMENT  January/December 2018   Total Hip Replacement R and L   TOTAL HIP ARTHROPLASTY Left 10/15/2016   Procedure: LEFT  TOTAL HIP ARTHROPLASTY ANTERIOR APPROACH;  Surgeon: Dempsey Moan, MD;  Location: [facility];  Service: Orthopedics;  Laterality: Left;   venereal wart resection  10/06/2010   Dr Lily   WISDOM TOOTH EXTRACTION     Family History  Problem Relation Age of Onset   Meniere's disease Father    Alcohol abuse Father    Coronary artery disease Father        S/P CABG , > 55   Lung cancer Maternal Uncle        smoker   Diabetes Neg Hx    Stroke Neg Hx    Colon cancer Neg Hx    Social History   Occupational History   Occupation: Conservation Officer, [employer]   Occupation: Still working full time  Tobacco Use   Smoking status: Never   Smokeless tobacco: Never  Vaping Use   Vaping status: Never Used  Substance and Sexual Activity   Alcohol use: No   Drug use: No    Comment: History-smoked pot    Sexual activity: Not Currently   Tobacco Counseling Counseling given: Not Answered  SDOH Screenings   Food Insecurity: No Food Insecurity (10/28/2024)  Housing: Unknown (10/28/2024)  Transportation Needs: No Transportation Needs (10/28/2024)  Utilities: Not At Risk (10/28/2024)  Alcohol Screen: Low Risk (12/09/2021)  Depression (PHQ2-9): Low Risk (10/28/2024)  Financial Resource Strain: Low Risk (12/09/2021)  Physical Activity: Sufficiently Active (10/28/2024)  Social Connections: Moderately Isolated (10/28/2024)  Stress: No Stress Concern Present (10/28/2024)  Tobacco Use: Low Risk (10/28/2024)  Health Literacy: Adequate Health Literacy (10/28/2024)   See flowsheets for full screening details  Depression Screen PHQ 2 & 9 Depression Scale- Over the past 2 weeks, how often have you been bothered by any of the following problems? Little interest or pleasure in doing things: 0 Feeling down, depressed, or hopeless (PHQ Adolescent also includes...irritable): 0 PHQ-2 Total Score: 0 Trouble falling or staying asleep, or sleeping too much: 0 Feeling tired or having little energy: 0 Poor appetite or overeating (PHQ  Adolescent also includes...weight loss): 0 Feeling bad about yourself - or that you are a failure or have let yourself or your family down: 0 Trouble concentrating on things, such as reading the newspaper or watching television (PHQ Adolescent also includes...like school work): 0 Moving or speaking so slowly that other people could have noticed. Or the opposite - being so fidgety or restless that you have been moving around a lot more than usual: 0 Thoughts that you would be better off dead, or of hurting yourself in some way: 0 PHQ-9 Total Score: 0 If you checked off any problems, how difficult have these problems made it for you to do your work, take care of things at home, or get along with other people?: Not difficult at all  Depression Treatment Depression Interventions/Treatment : EYV7-0 Score <4 Follow-up Not Indicated     Goals Addressed             This Visit's Progress    Patient Stated   On track    To maintain my current health status by continuing to eat healthy, stay physically active and socially active.             Objective:    Today's Vitals  10/28/24 1537  BP: 110/70  Pulse: 69  SpO2: 99%  Weight: 165 lb 6.4 oz (75 kg)  Height: 5' 8 (1.727 m)   Body mass index is 25.15 kg/m.  Hearing/Vision screen Hearing Screening - Comments:: Denies hearing difficulties   Vision Screening - Comments:: Wears eyeglasses/[facility]/UTD Immunizations and Health Maintenance Health Maintenance  Topic Date Due   Zoster Vaccines- Shingrix (1 of 2) Never done   DTaP/Tdap/Td (3 - Td or Tdap) 11/23/2023   COVID-19 Vaccine (4 - 2025-26 season) 06/06/2024   Colonoscopy  09/09/2025   Medicare Annual Wellness (AWV)  10/28/2025   Pneumococcal Vaccine: 50+ Years  Completed   Influenza Vaccine  Completed   Hepatitis C Screening  Completed   Meningococcal B Vaccine  Aged Out   Hepatitis B Vaccines 19-59 Average Risk  Discontinued        Assessment/Plan:  This is a  routine wellness examination for Timothy Bentley.  Patient Care Team: Rollene Almarie LABOR, MD as PCP - General (Internal Medicine) [facility] [place] as Consulting Physician (Optometry) Alvia Norleen BIRCH, MD as Consulting Physician (Ophthalmology)  I have personally reviewed and noted the following in the patients chart:   Medical and social history Use of alcohol, tobacco or illicit drugs  Current medications and supplements including opioid prescriptions. Functional ability and status Nutritional status Physical activity Advanced directives List of other physicians Hospitalizations, surgeries, and ER visits in previous 12 months Vitals Screenings to include cognitive, depression, and falls Referrals and appointments  No orders of the defined types were placed in this encounter.  In addition, I have reviewed and discussed with patient certain preventive protocols, quality metrics, and best practice recommendations. A written personalized care plan for preventive services as well as general preventive health recommendations were provided to patient.   Timothy Bentley, CMA   10/28/2024   Return in 1 year (on 10/28/2025).  After Visit Summary: (MyChart) Due to this being a telephonic visit, the after visit summary with patients personalized plan was offered to patient via MyChart   Nurse Notes: Patient is due for a Tdap.  Patient is up to date on all other health maintenance with no concerns to address today.  "

## 2024-11-11 ENCOUNTER — Telehealth: Payer: Self-pay

## 2024-11-11 ENCOUNTER — Other Ambulatory Visit: Payer: Self-pay | Admitting: Internal Medicine

## 2024-11-11 ENCOUNTER — Encounter: Payer: Self-pay | Admitting: Internal Medicine

## 2024-11-11 DIAGNOSIS — I10 Essential (primary) hypertension: Secondary | ICD-10-CM

## 2024-11-11 DIAGNOSIS — I119 Hypertensive heart disease without heart failure: Secondary | ICD-10-CM

## 2024-11-11 NOTE — Telephone Encounter (Unsigned)
 Copied from CRM [id number]. Topic: Clinical - Prescription Issue >> Nov 11, 2024 11:45 AM Treva T wrote: Reason for CRM: Pt calling, reports per pharmacy medication refill request, irbesartan  (AVAPRO ) 150 MG tablet, was denied, and advised to contact provider.   Per chart review, med refill request already submitted, as original prescription expired on 07/05/24.  Leaving to go out of town, and is requesting medication to be refilled today if possible.   Pt informed of turn around time of 3 business days for medication refills.   Requesting follow up call to advise at [phone]    Preferred pharmacy: [id number] - [place], [place] - [address] AT [facility] [address] [place] [address] Phone: [phone] Fax: [phone]

## 2024-11-11 NOTE — Telephone Encounter (Signed)
 Copied from CRM #[id number]. Topic: Clinical - Medication Refill >> Nov 11, 2024  9:38 AM Rea C wrote: Medication: irbesartan  (AVAPRO ) 150 MG tablet  Has the patient contacted their pharmacy? Yes (Agent: If no, request that the patient contact the pharmacy for the refill. If patient does not wish to contact the pharmacy document the reason why and proceed with request.) (Agent: If yes, when and what did the pharmacy advise?)  This is the patient's preferred pharmacy:  [id number] - [place], [place] - [address] AT [facility] [address] [place] [address] Phone: [phone] Fax: [phone]   Is this the correct pharmacy for this prescription? Yes If no, delete pharmacy and type the correct one.   Has the prescription been filled recently? Yes  Is the patient out of the medication? Pt is leaving and has about 5 left. Patient would like to pick up his prescription today, if possible.   Has the patient been seen for an appointment in the last year OR does the patient have an upcoming appointment? Yes  Can we respond through MyChart? Yes  Agent: Please be advised that Rx refills may take up to 3 business days. We ask that you follow-up with your pharmacy.

## 2024-11-11 NOTE — Telephone Encounter (Signed)
 Medication refilled.

## 2024-11-30 ENCOUNTER — Ambulatory Visit: Admitting: Licensed Clinical Social Worker

## 2024-11-30 ENCOUNTER — Encounter (INDEPENDENT_AMBULATORY_CARE_PROVIDER_SITE_OTHER): Admitting: Ophthalmology
